# Patient Record
Sex: Female | Born: 1955 | Race: White | Hispanic: No | Marital: Married
Health system: Midwestern US, Community
[De-identification: ages and names within clinical notes are randomized; demographics above are authoritative.]

## PROBLEM LIST (undated history)

## (undated) DIAGNOSIS — M25569 Pain in unspecified knee: Secondary | ICD-10-CM

## (undated) DIAGNOSIS — M5410 Radiculopathy, site unspecified: Secondary | ICD-10-CM

## (undated) DIAGNOSIS — M47812 Spondylosis without myelopathy or radiculopathy, cervical region: Secondary | ICD-10-CM

## (undated) DIAGNOSIS — M545 Low back pain, unspecified: Secondary | ICD-10-CM

## (undated) DIAGNOSIS — K2960 Other gastritis without bleeding: Secondary | ICD-10-CM

## (undated) DIAGNOSIS — M1990 Unspecified osteoarthritis, unspecified site: Secondary | ICD-10-CM

## (undated) DIAGNOSIS — F419 Anxiety disorder, unspecified: Secondary | ICD-10-CM

## (undated) DIAGNOSIS — G2581 Restless legs syndrome: Secondary | ICD-10-CM

## (undated) DIAGNOSIS — R1013 Epigastric pain: Secondary | ICD-10-CM

## (undated) DIAGNOSIS — R519 Headache, unspecified: Secondary | ICD-10-CM

## (undated) DIAGNOSIS — D649 Anemia, unspecified: Secondary | ICD-10-CM

## (undated) DIAGNOSIS — T39395A Adverse effect of other nonsteroidal anti-inflammatory drugs [NSAID], initial encounter: Secondary | ICD-10-CM

## (undated) DIAGNOSIS — IMO0002 Reserved for concepts with insufficient information to code with codable children: Secondary | ICD-10-CM

## (undated) DIAGNOSIS — Z8619 Personal history of other infectious and parasitic diseases: Secondary | ICD-10-CM

## (undated) DIAGNOSIS — M84451A Pathological fracture, right femur, initial encounter for fracture: Secondary | ICD-10-CM

## (undated) DIAGNOSIS — T7840XA Allergy, unspecified, initial encounter: Secondary | ICD-10-CM

## (undated) DIAGNOSIS — R51 Headache: Secondary | ICD-10-CM

## (undated) DIAGNOSIS — K219 Gastro-esophageal reflux disease without esophagitis: Secondary | ICD-10-CM

## (undated) DIAGNOSIS — K5909 Other constipation: Secondary | ICD-10-CM

## (undated) DIAGNOSIS — C801 Malignant (primary) neoplasm, unspecified: Secondary | ICD-10-CM

## (undated) HISTORY — PX: TUBAL LIGATION: SHX77

## (undated) HISTORY — DX: Reserved for concepts with insufficient information to code with codable children: IMO0002

## (undated) HISTORY — DX: Epigastric pain: R10.13

## (undated) HISTORY — DX: Other constipation: K59.09

## (undated) HISTORY — DX: Gastro-esophageal reflux disease without esophagitis: K21.9

## (undated) HISTORY — DX: Restless legs syndrome: G25.81

## (undated) HISTORY — DX: Other gastritis without bleeding: K29.60

## (undated) HISTORY — DX: Personal history of other infectious and parasitic diseases: Z86.19

## (undated) HISTORY — DX: Allergy, unspecified, initial encounter: T78.40XA

## (undated) HISTORY — DX: Adverse effect of other nonsteroidal anti-inflammatory drugs (NSAID), initial encounter: T39.395A

## (undated) HISTORY — DX: Pathological fracture, right femur, initial encounter for fracture: M84.451A

## (undated) HISTORY — DX: Anxiety disorder, unspecified: F41.9

## (undated) HISTORY — DX: Anemia, unspecified: D64.9

## (undated) HISTORY — DX: Unspecified osteoarthritis, unspecified site: M19.90

---

## 1998-02-06 ENCOUNTER — Other Ambulatory Visit: Admission: RE | Admit: 1998-02-06 | Discharge: 1998-02-06 | Payer: Self-pay | Admitting: Gynecology

## 1999-02-05 ENCOUNTER — Ambulatory Visit (HOSPITAL_COMMUNITY): Admission: RE | Admit: 1999-02-05 | Discharge: 1999-02-05 | Payer: Self-pay | Admitting: Internal Medicine

## 1999-02-05 ENCOUNTER — Encounter: Payer: Self-pay | Admitting: Internal Medicine

## 1999-04-09 ENCOUNTER — Other Ambulatory Visit: Admission: RE | Admit: 1999-04-09 | Discharge: 1999-04-09 | Payer: Self-pay | Admitting: Gynecology

## 2000-04-07 ENCOUNTER — Other Ambulatory Visit: Admission: RE | Admit: 2000-04-07 | Discharge: 2000-04-07 | Payer: Self-pay | Admitting: Gynecology

## 2001-02-12 ENCOUNTER — Encounter: Payer: Self-pay | Admitting: Neurosurgery

## 2001-02-12 ENCOUNTER — Ambulatory Visit (HOSPITAL_COMMUNITY): Admission: RE | Admit: 2001-02-12 | Discharge: 2001-02-12 | Payer: Self-pay | Admitting: Neurosurgery

## 2001-02-23 ENCOUNTER — Other Ambulatory Visit: Admission: RE | Admit: 2001-02-23 | Discharge: 2001-02-23 | Payer: Self-pay | Admitting: Internal Medicine

## 2001-04-12 ENCOUNTER — Other Ambulatory Visit: Admission: RE | Admit: 2001-04-12 | Discharge: 2001-04-12 | Payer: Self-pay | Admitting: Gynecology

## 2002-03-22 ENCOUNTER — Other Ambulatory Visit: Admission: RE | Admit: 2002-03-22 | Discharge: 2002-03-22 | Payer: Self-pay | Admitting: Gynecology

## 2003-04-03 ENCOUNTER — Other Ambulatory Visit: Admission: RE | Admit: 2003-04-03 | Discharge: 2003-04-03 | Payer: Self-pay | Admitting: Gynecology

## 2004-04-29 ENCOUNTER — Other Ambulatory Visit: Admission: RE | Admit: 2004-04-29 | Discharge: 2004-04-29 | Payer: Self-pay | Admitting: Gynecology

## 2005-05-15 ENCOUNTER — Ambulatory Visit: Payer: Self-pay | Admitting: Internal Medicine

## 2005-05-19 ENCOUNTER — Other Ambulatory Visit: Admission: RE | Admit: 2005-05-19 | Discharge: 2005-05-19 | Payer: Self-pay | Admitting: Gynecology

## 2006-04-01 ENCOUNTER — Ambulatory Visit (HOSPITAL_COMMUNITY): Admission: RE | Admit: 2006-04-01 | Discharge: 2006-04-01 | Payer: Self-pay | Admitting: Family Medicine

## 2006-12-05 ENCOUNTER — Emergency Department (HOSPITAL_COMMUNITY): Admission: EM | Admit: 2006-12-05 | Discharge: 2006-12-05 | Payer: Self-pay | Admitting: Emergency Medicine

## 2008-05-29 ENCOUNTER — Ambulatory Visit: Payer: Self-pay | Admitting: Internal Medicine

## 2008-06-13 ENCOUNTER — Ambulatory Visit (HOSPITAL_COMMUNITY): Admission: RE | Admit: 2008-06-13 | Discharge: 2008-06-13 | Payer: Self-pay | Admitting: Internal Medicine

## 2008-06-13 ENCOUNTER — Ambulatory Visit: Payer: Self-pay | Admitting: Internal Medicine

## 2008-08-01 ENCOUNTER — Telehealth (INDEPENDENT_AMBULATORY_CARE_PROVIDER_SITE_OTHER): Payer: Self-pay | Admitting: *Deleted

## 2010-10-29 NOTE — Op Note (Signed)
NAMECHANAH, TIDMORE                  ACCOUNT NO.:  [id number]   MEDICAL RECORD NO.:  [id number]          PATIENT TYPE:  AMB   LOCATION:  DAY                           FACILITY:  [facility]   PHYSICIAN:  R. Roetta Sessions, M.D. DATE OF BIRTH:  1956-02-18   DATE OF PROCEDURE:  06/13/2008  DATE OF DISCHARGE:                               OPERATIVE REPORT   INDICATIONS FOR PROCEDURE:  A 55 year old lady with chronic  constipation.  Last colonoscopy was done by Dr. Juanda Chance downs in  [place] 12 years ago.  She does not get results with polyethylene  glycol in the way of MiraLax and GlycoLax.  However, she takes the  agents sporadically and tends to overshoot her endpoint when she resumes  the medication.  Thereafter, no bowel movement in number of days.  She  has not passed any blood per rectum.  There is no family history in the  first-degree relatives with colon cancer.  Colonoscopy is now being done  with standard screening maneuver.  Risks, benefits, and limitations have  been reviewed and questions answered.  She is agreeable.  Please see the  documentation of the medical record.   PROCEDURE NOTE:  O2 saturation, blood pressure, pulse, and respirations  were monitored throughout the entire procedure.   CONSCIOUS SEDATION:  Versed 5 mg IV and Demerol 100 mg IV in divided  doses.   INSTRUMENT:  Pentax video chip system.   FINDINGS:  Digital rectal exam revealed minimal anal canal hemorrhoidal  tags.  Endoscopic findings:  Prep was adequate.  Colon:  The colonic  mucosa was surveyed from the rectosigmoid junction through the left  transverse, right colon, appendiceal orifice, ileocecal valve, and  cecum.  These structures were well seen and photographed for the record.  From this level, scope was slowly withdrawn.  The previously mentioned  mucosal surfaces were again seen.  The colonic mucosa appeared entirely  normal.  Scope was pulled down the rectum.  Thorough examination of the  rectal mucosa including retroflex view of the anal verge demonstrated  only anal canal hemorrhoids.  The patient tolerated the procedure well  and was reactive to Endoscopy.   IMPRESSION:  Anal canal hemorrhoids, otherwise normal rectum and colon.   RECOMMENDATIONS:  1. Repeat screening colonoscopy in 10 years.  2. We would take MiraLax 17 g orally at bedtime and would take it      nightly as needed if no bowel movement on any given day.  She may      take it twice daily in titration, if she has bouts of her      constipation.  If this is not satisfactory in managing bowel      function, she is to call me and let me know.      Jonathon Bellows, M.D.  Electronically Signed     RMR/MEDQ  D:  06/13/2008  T:  06/14/2008  Job:  [id number]   cc:   Tammy R. Collins Scotland, M.D.  Fax: [phone]

## 2010-10-29 NOTE — Telephone Encounter (Signed)
NAMEMarland Matthews  MAJESTI, GAMBRELL                   CHART#:  [id number]   DATE:  05/29/2008                       DOB:  [birth year]   REASON FOR CONSULTATION:  Needs colonoscopy, history of constipation.   PHYSICIAN REQUESTING CONSULTATION:  Tammy R. Collins Scotland, MD   HISTORY OF PRESENT ILLNESS:  The patient is a very pleasant 55 year old  Caucasian female who presents today for further evaluation of chronic  constipation and need for colonoscopy.  She also has chronic GERD.  Her  last colonoscopy was in 1997 by Dr. Lina Sar.  She had hemorrhoids,  but otherwise unremarkable.  She has also had a prior endoscopy by Dr.  Juanda Chance in 2002, for chronic GERD and had a tongue of gastric mucosa at  the distal esophagus, status post biopsy and also had biopsy from the  antrum.  We do not have any other pathology.  The patient states that  she was told she did not have Barrett esophagus.  She has chronic GERD.  She has heartburn daily if she does not take anything.  She generally  takes ranitidine 150 mg b.i.d.  She does this for cost reasons due to  poor healthcare coverage.  She has been on Nexium and Protonix in the  past with pretty good results.  She does take Prilosec OTC on occasions.  She denies any dysphagia or odynophagia.  Generally, her heartburn is  fairly well controlled.  She has breakthrough symptoms with dietary  indiscretions.  She has occasional abdominal bloating, gas and crampy  abdominal pain with certain foods as well.  She believes her lactose  intolerance has started using Lactaid products with some results.  Generally, her bowel movements occur daily if she takes MiraLax 17 g  daily.  She denies any blood in the stool or melena.  She has a aunt who  has colon cancer.  No first degree relatives with colon cancer.   CURRENT MEDICATIONS:  1. MiraLax 17 g daily.  2. Ropinirole 0.5 mg p.r.n., restless leg syndrome.  3. Ranitidine 150 mg b.i.d.  4. Tylenol p.r.n.  5. Butal-APAP p.r.n.   ALLERGIES:  Prednisone.   PAST MEDICAL HISTORY:  Constipation, chronic GERD, and restless leg  syndrome.   PAST SURGICAL HISTORY:  Colonoscopy in 1997 as above, EGD in 2002 as  above, and tubal ligation.   FAMILY HISTORY:  Aunt had colon cancer.  Mother has history of scoliosis  and mitral valve prolapse.  Father deceased in his 17s due to an  accident.   SOCIAL HISTORY:  She is married and has 3 children.  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  No alcohol use.   REVIEW OF SYSTEMS:  See HPI for GI.  Constitutional:  No weight loss.  Cardiopulmonary:  No chest pain, shortness of breath, palpitations, or  cough.  Genitourinary:  No dysuria or hematuria.   PHYSICAL EXAMINATION:  VITAL SIGNS:  Weight 136, height 5 feet 2 inches,  temp 98.3, blood pressure 110/80, and pulse 80.  GENERAL:  A pleasant, well-nourished, well-developed Caucasian female in  no acute distress.  SKIN:  Warm and dry.  No jaundice.  HEENT:  Sclerae nonicteric.  Oropharyngeal mucosa moist and pink.  No  lesions, erythema, or exudate.  No lymphadenopathy or thyromegaly.  CHEST:  Lungs are clear to auscultation.  CARDIAC:  Regular rate and rhythm.  Normal S1 and S2.  No murmurs, rubs,  or gallops.  ABDOMEN:  Positive bowel sounds.  Abdomen is soft and nondistended.  She  has some mild tenderness in the epigastrium to deep palpation.  No  rebound or guarding.  No organomegaly or masses.  No abdominal bruits or  hernias.  LOWER EXTREMITIES:  No edema.   IMPRESSION:  The patient is a 55 year old lady with chronic  gastroesophageal reflux disease and chronic constipation.  It has been  over 12 years since her last colonoscopy.  We would recommend  colonoscopy at this time.  In addition, she has questionable history of  Barrett esophagus, although, according to the patient this was not  confirmed on biopsy.  We would like to see these results.  I had spoken  with her today and if  there is any concern that she may have Barrett  esophagus, would go ahead and pursue EGD for surveillance purposes, but  otherwise would plan on just a colonoscopy.   PLAN:  1. Colonoscopy in near future with Dr. Jena Gauss.  2. Retrieve EGD/biopsy reports from Dr. Regino Schultze office from 2002.  3. Discussed potential for tachyphylaxis, on chronic ranitidine.      Advised that she should take a one-week drug break once a month      and she may consider using Prilosec OTC or Prevacid OTC during      those weeks before resuming ranitidine.  Prilosec OTC, #20 samples      provided.  4. I would like to thank Dr. Collins Scotland for allowing [place] to take part in the      care of this patient.      Tana Coast, P.A.  Electronically Signed     R. Roetta Sessions, M.D.  Electronically Signed   LL/MEDQ  D:  05/29/2008  T:  05/29/2008  Job:  [id number]   cc:   Tammy R. Collins Scotland, M.D.

## 2013-08-02 ENCOUNTER — Other Ambulatory Visit: Payer: Self-pay | Admitting: Obstetrics and Gynecology

## 2013-10-11 LAB — HM COLONOSCOPY: HM COLON: NORMAL

## 2014-03-13 LAB — LIPID PANEL
CHOLESTEROL: 233 mg/dL — AB (ref 0–200)
HDL: 86 mg/dL — AB (ref 35–70)
LDL Cholesterol: 130 mg/dL
TRIGLYCERIDES: 84 mg/dL (ref 40–160)

## 2014-03-13 LAB — TSH: TSH: 1.82 u[IU]/mL (ref <=5.90)

## 2014-03-13 LAB — BASIC METABOLIC PANEL
BUN: 13 mg/dL (ref 4–21)
CREATININE: 0.7 mg/dL (ref <=1.1)
Glucose: 90 mg/dL
POTASSIUM: 5 mmol/L (ref 3.4–5.3)
SODIUM: 140 mmol/L (ref 137–147)

## 2014-03-13 LAB — HEPATIC FUNCTION PANEL
ALK PHOS: 69 U/L (ref 25–125)
ALT: 15 U/L (ref 7–35)
AST: 22 U/L (ref 13–35)
BILIRUBIN, TOTAL: 1.6 mg/dL

## 2014-03-13 LAB — HM PAP SMEAR

## 2014-04-27 ENCOUNTER — Other Ambulatory Visit: Payer: Self-pay | Admitting: Dermatology

## 2014-09-25 ENCOUNTER — Other Ambulatory Visit: Payer: Self-pay | Admitting: Obstetrics and Gynecology

## 2014-09-26 LAB — CYTOLOGY - PAP

## 2014-11-16 LAB — CBC AND DIFFERENTIAL
HCT: 35 % — AB (ref 36–46)
HEMOGLOBIN: 11.8 g/dL — AB (ref 12.0–16.0)
Neutrophils Absolute: 5 /uL
Platelets: 227 10*3/uL (ref 150–399)
WBC: 7.7 10^3/mL

## 2014-12-12 ENCOUNTER — Telehealth: Payer: Self-pay | Admitting: Internal Medicine

## 2014-12-12 NOTE — Telephone Encounter (Signed)
Pt is requesting to switch from Dr. Gala Romney back to Dr. Olevia Perches. Records placed on Dr. Nichola Sizer desk for review

## 2014-12-13 ENCOUNTER — Telehealth: Payer: Self-pay | Admitting: Internal Medicine

## 2014-12-13 NOTE — Telephone Encounter (Signed)
Rec'd from Dr. Audree Camel forward 6 pages to Dr. Olevia Perches

## 2014-12-20 ENCOUNTER — Encounter: Payer: Self-pay | Admitting: Internal Medicine

## 2014-12-20 NOTE — Telephone Encounter (Signed)
Records reviewed and sch'd Pt for 03-01-15 @ 2:45 w/Dr. Olevia Perches. Paperwork mailed to Pt

## 2015-01-19 ENCOUNTER — Ambulatory Visit (INDEPENDENT_AMBULATORY_CARE_PROVIDER_SITE_OTHER): Payer: 59 | Admitting: Internal Medicine

## 2015-01-19 ENCOUNTER — Encounter: Payer: Self-pay | Admitting: Internal Medicine

## 2015-01-19 ENCOUNTER — Other Ambulatory Visit (INDEPENDENT_AMBULATORY_CARE_PROVIDER_SITE_OTHER): Payer: 59

## 2015-01-19 VITALS — BP 124/76 | HR 72 | Ht 62.0 in | Wt 132.2 lb

## 2015-01-19 DIAGNOSIS — D509 Iron deficiency anemia, unspecified: Secondary | ICD-10-CM | POA: Diagnosis not present

## 2015-01-19 DIAGNOSIS — R1013 Epigastric pain: Secondary | ICD-10-CM | POA: Diagnosis not present

## 2015-01-19 LAB — CBC WITH DIFFERENTIAL/PLATELET
BASOS PCT: 0.8 % (ref 0.0–3.0)
Basophils Absolute: 0 10*3/uL (ref 0.0–0.1)
EOS ABS: 0.1 10*3/uL (ref 0.0–0.7)
Eosinophils Relative: 1.4 % (ref 0.0–5.0)
HCT: 40.6 % (ref 36.0–46.0)
Hemoglobin: 13.7 g/dL (ref 12.0–15.0)
LYMPHS ABS: 1.3 10*3/uL (ref 0.7–4.0)
LYMPHS PCT: 28.4 % (ref 12.0–46.0)
MCHC: 33.8 g/dL (ref 30.0–36.0)
MCV: 88.1 fl (ref 78.0–100.0)
MONOS PCT: 6.2 % (ref 3.0–12.0)
Monocytes Absolute: 0.3 10*3/uL (ref 0.1–1.0)
Neutro Abs: 2.9 10*3/uL (ref 1.4–7.7)
Neutrophils Relative %: 63.2 % (ref 43.0–77.0)
Platelets: 162 10*3/uL (ref 150.0–400.0)
RBC: 4.6 Mil/uL (ref 3.87–5.11)
RDW: 21.1 % — AB (ref 11.5–15.5)
WBC: 4.6 10*3/uL (ref 4.0–10.5)

## 2015-01-19 NOTE — Progress Notes (Signed)
ROSALYND MCWRIGHT Oct 26, 1955 [id number]  Note: This dictation was prepared with Dragon digital system. Any transcriptional errors that result from this procedure are unintentional.   History of Present Illness: This is a 59 year old white female who is here for evaluation of iron deficiency anemia. We saw her  in 1999 for screening colonoscopy which was normal. She had a subsequent colonoscopy in December 2009 by Dr.Rourk and again this was a normal exam except for anal canal hemorrhoids. She has been having intermittent heartburn and regurgitation of the food for which she takes ranitidine 150 mg as needed and most recently on every day basis. She had a severe attack of epigastric pain lasting several hours about a week ago. There is a positive family history of gallbladder disease in her mother. She has lost 3-4 pounds in the last few months. She has normal bowel habits. She denies having wheat allergy. In June 2016 serum iron 7. Iron saturation 10%. Ferritin 8.0 hemoglobin 11.8 with MCV of 78. She has been postmenopausal for over 10 years. She received  2 iron infusions by Dr Jacquiline Doe.Marland KitchensSe no longer chews on ice. There is no family history of inflammatory bowel disease or colon cancer     Past Medical History  Diagnosis Date  . Chronic constipation   . GERD (gastroesophageal reflux disease)   . RLS (restless legs syndrome)   . Anemia     Past Surgical History  Procedure Laterality Date  . Tubal ligation      Allergies  Allergen Reactions  . Prednisone     Family history and social history have been reviewed.  Review of Systems:   The remainder of the 10 point ROS is negative except as outlined in the H&P  Physical Exam: General Appearance Well developed, in no distress Eyes  Non icteric  HEENT  Non traumatic, normocephalic  Mouth No lesion, tongue papillated, no cheilosis Neck Supple without adenopathy, thyroid not enlarged, no carotid bruits, no JVD Lungs Clear to auscultation  bilaterally COR Normal S1, normal S2, regular rhythm, no murmur, quiet precordium Abdomen Soft with good muscular support. Normoactive bowel sounds. No tenderness. Liver edge at costal margin  Rectal Soft Hemoccult-negative stool  Extremities  No pedal edema Skin No lesions Neurological Alert and oriented x 3 Psychological Normal mood and affect  Assessment and Plan:   59 year old white female with asymptomatic iron deficiency anemia and history of gastroesophageal reflux only partially controlled on ranitidine. She has had minimal weight loss . She has taken 2-4 ibuprofen 3-4 times a week whichmay be causing gastropathy possibly, an ulcer. She is heme occult negative on my exam today. Last colonoscopy in 2009. We will proceed with upper endoscopy and colonoscopy. We will also check sprue profile and repeat  CBC.  Episodic epigastric pain. In the setting of positive family history of gallbladder disease. We will proceed with upper abdominal ultrasound to visualize gallbladder as well as her pancreas    Delfin Edis 01/19/2015

## 2015-01-19 NOTE — Patient Instructions (Addendum)
Dr Paul Dykes, [facility]  Your physician has requested that you go to the basement for lab work before leaving today.  You have been scheduled for an abdominal ultrasound at [facility] (1st floor of hospital) on 01/24/2015 at 8:00am. Please arrive 15 minutes prior to your appointment for registration. Make certain not to have anything to eat or drink 6 hours prior to your appointment. Should you need to reschedule your appointment, please contact radiology at [phone]. This test typically takes about 30 minutes to perform.

## 2015-01-22 LAB — GLIA (IGA/G) + TTG IGA
Gliadin IgA: 5 Units (ref <20)
Gliadin IgG: 2 Units (ref <20)
Tissue Transglutaminase Ab, IgA: 1 U/mL (ref <4)

## 2015-01-24 ENCOUNTER — Ambulatory Visit (HOSPITAL_COMMUNITY)
Admission: RE | Admit: 2015-01-24 | Discharge: 2015-01-24 | Disposition: A | Discharge status: Home / Self Care | Payer: 59 | Source: Ambulatory Visit | Attending: Internal Medicine | Admitting: Internal Medicine

## 2015-01-24 DIAGNOSIS — R1013 Epigastric pain: Secondary | ICD-10-CM | POA: Insufficient documentation

## 2015-01-25 ENCOUNTER — Telehealth: Payer: Self-pay | Admitting: Internal Medicine

## 2015-01-25 DIAGNOSIS — R932 Abnormal findings on diagnostic imaging of liver and biliary tract: Secondary | ICD-10-CM

## 2015-01-25 NOTE — Telephone Encounter (Signed)
Scheduled HIDA with [facility]) at [facility] radiology on 02/13/15 at 9:30 AM. Patient notified of appointment date and time.

## 2015-01-25 NOTE — Telephone Encounter (Signed)
I did not realize she was having so many symptoms for so long. In that case  ,please ,schedule HIDA scan with CCK to see if gall bladder functions. It will help [place] to make a decision about lap chole.

## 2015-01-25 NOTE — Telephone Encounter (Signed)
Patient left a message that she wants to know what to expect with the [place] result of sludge in gall bladder. States she has had 2 years of a heavy feeling, nausea and discomfort after eating a heavy meal. Also reports she has had several "attacks of pain that rates a 10." Please, advise.

## 2015-01-25 NOTE — Telephone Encounter (Signed)
Left a message for patient to call back. 

## 2015-01-30 ENCOUNTER — Telehealth: Payer: Self-pay | Admitting: Internal Medicine

## 2015-01-30 ENCOUNTER — Encounter: Payer: Self-pay | Admitting: *Deleted

## 2015-01-30 NOTE — Telephone Encounter (Signed)
Left a message for patient to call back. 

## 2015-01-30 NOTE — Telephone Encounter (Signed)
Spoke with patient and gave her information re: HIDA scan.

## 2015-02-13 ENCOUNTER — Encounter (HOSPITAL_COMMUNITY)
Admission: RE | Admit: 2015-02-13 | Discharge: 2015-02-13 | Disposition: A | Discharge status: Home / Self Care | Payer: 59 | Source: Ambulatory Visit | Attending: Internal Medicine | Admitting: Internal Medicine

## 2015-02-13 DIAGNOSIS — R932 Abnormal findings on diagnostic imaging of liver and biliary tract: Secondary | ICD-10-CM | POA: Diagnosis not present

## 2015-02-13 MED ORDER — TECHNETIUM TC 99M MEBROFENIN IV KIT
5.2000 | PACK | Freq: Once | INTRAVENOUS | Status: DC | PRN
  Administered 2015-02-13: 5.2 via INTRAVENOUS
  Filled 2015-02-13: qty 6

## 2015-02-13 NOTE — Progress Notes (Signed)
I briefly reviewed her chart. Normal HIDA argues against gallbladder etiology. I believe she is scheduled for EGD and colonoscopy for her anemia, is this scheduled? I would recommend EGD as the next step to evaluate her symptoms and await results of this and colonoscopy regarding her anemia. Thanks

## 2015-02-14 ENCOUNTER — Other Ambulatory Visit: Payer: Self-pay | Admitting: *Deleted

## 2015-02-14 ENCOUNTER — Telehealth: Payer: Self-pay | Admitting: Gastroenterology

## 2015-02-14 MED ORDER — OMEPRAZOLE 40 MG PO CPDR: 40.0000 mg | DELAYED_RELEASE_CAPSULE | Freq: Every day | ORAL | Status: DC

## 2015-02-14 NOTE — Telephone Encounter (Signed)
Debra Payor, RN spoke with the patient and all of her questions answered

## 2015-02-16 ENCOUNTER — Ambulatory Visit: Payer: 59 | Admitting: Gastroenterology

## 2015-03-01 ENCOUNTER — Ambulatory Visit: Payer: Self-pay | Admitting: Internal Medicine

## 2015-03-13 ENCOUNTER — Ambulatory Visit (AMBULATORY_SURGERY_CENTER): Payer: Self-pay

## 2015-03-13 VITALS — Ht 62.0 in | Wt 133.0 lb

## 2015-03-13 DIAGNOSIS — D509 Iron deficiency anemia, unspecified: Secondary | ICD-10-CM

## 2015-03-13 DIAGNOSIS — K219 Gastro-esophageal reflux disease without esophagitis: Secondary | ICD-10-CM

## 2015-03-13 MED ORDER — NA SULFATE-K SULFATE-MG SULF 17.5-3.13-1.6 GM/177ML PO SOLN: ORAL | Status: DC

## 2015-03-13 NOTE — Progress Notes (Signed)
Per pt, no allergies to soy or egg products.Pt not taking any weight loss meds or using  O2 at home. 

## 2015-03-21 ENCOUNTER — Ambulatory Visit (AMBULATORY_SURGERY_CENTER): Payer: 59 | Admitting: Gastroenterology

## 2015-03-21 ENCOUNTER — Encounter: Payer: Self-pay | Admitting: Gastroenterology

## 2015-03-21 VITALS — BP 131/68 | HR 63 | Temp 97.8°F | Resp 19 | Ht 62.0 in | Wt 133.0 lb

## 2015-03-21 DIAGNOSIS — K2970 Gastritis, unspecified, without bleeding: Secondary | ICD-10-CM

## 2015-03-21 DIAGNOSIS — K2990 Gastroduodenitis, unspecified, without bleeding: Secondary | ICD-10-CM | POA: Diagnosis not present

## 2015-03-21 DIAGNOSIS — D509 Iron deficiency anemia, unspecified: Secondary | ICD-10-CM

## 2015-03-21 DIAGNOSIS — R197 Diarrhea, unspecified: Secondary | ICD-10-CM | POA: Diagnosis not present

## 2015-03-21 DIAGNOSIS — K219 Gastro-esophageal reflux disease without esophagitis: Secondary | ICD-10-CM

## 2015-03-21 MED ORDER — SODIUM CHLORIDE 0.9 % IV SOLN: 500.0000 mL | INTRAVENOUS | Status: DC

## 2015-03-21 NOTE — Progress Notes (Signed)
Report to PACU, RN, vss, BBS= Clear.  

## 2015-03-21 NOTE — Progress Notes (Signed)
Called to room to assist during endoscopic procedure.  Patient ID and intended procedure confirmed with present staff. Received instructions for my participation in the procedure from the performing physician.  

## 2015-03-21 NOTE — Patient Instructions (Addendum)
Biopsies taken today.  NO NSAIDS! May take Tylenol as needed for pain.  Continue twice daily Omeprazole.  Resume current diet.  YOU HAD AN ENDOSCOPIC PROCEDURE TODAY AT [facility]:   Refer to the procedure report that was given to you for any specific questions about what was found during the examination.  If the procedure report does not answer your questions, please call your gastroenterologist to clarify.  If you requested that your care partner not be given the details of your procedure findings, then the procedure report has been included in a sealed envelope for you to review at your convenience later.  YOU SHOULD EXPECT: Some feelings of bloating in the abdomen. Passage of more gas than usual.  Walking can help get rid of the air that was put into your GI tract during the procedure and reduce the bloating. If you had a lower endoscopy (such as a colonoscopy or flexible sigmoidoscopy) you may notice spotting of blood in your stool or on the toilet paper. If you underwent a bowel prep for your procedure, you may not have a normal bowel movement for a few days.  Please Note:  You might notice some irritation and congestion in your nose or some drainage.  This is from the oxygen used during your procedure.  There is no need for concern and it should clear up in a day or so.  SYMPTOMS TO REPORT IMMEDIATELY:   Following lower endoscopy (colonoscopy or flexible sigmoidoscopy):  Excessive amounts of blood in the stool  Significant tenderness or worsening of abdominal pains  Swelling of the abdomen that is new, acute  Fever of 100F or higher   Following upper endoscopy (EGD)  Vomiting of blood or coffee ground material  New chest pain or pain under the shoulder blades  Painful or persistently difficult swallowing  New shortness of breath  Fever of 100F or higher  Black, tarry-looking stools  For urgent or emergent issues, a gastroenterologist can be reached at any hour  by calling [phone].   DIET: Your first meal following the procedure should be a small meal and then it is ok to progress to your normal diet. Heavy or fried foods are harder to digest and may make you feel nauseous or bloated.  Likewise, meals heavy in dairy and vegetables can increase bloating.  Drink plenty of fluids but you should avoid alcoholic beverages for 24 hours.  ACTIVITY:  You should plan to take it easy for the rest of today and you should NOT DRIVE or use heavy machinery until tomorrow (because of the sedation medicines used during the test).    FOLLOW UP: Our staff will call the number listed on your records the next business day following your procedure to check on you and address any questions or concerns that you may have regarding the information given to you following your procedure. If we do not reach you, we will leave a message.  However, if you are feeling well and you are not experiencing any problems, there is no need to return our call.  We will assume that you have returned to your regular daily activities without incident.  If any biopsies were taken you will be contacted by phone or by letter within the next 1-3 weeks.  Please call us at [phone] if you have not heard about the biopsies in 3 weeks.    SIGNATURES/CONFIDENTIALITY: You and/or your care partner have signed paperwork which will be entered into your  electronic medical record.  These signatures attest to the fact that that the information above on your After Visit Summary has been reviewed and is understood.  Full responsibility of the confidentiality of this discharge information lies with you and/or your care-partner.

## 2015-03-21 NOTE — Op Note (Signed)
[place]  [address]. [place], [id number]   COLONOSCOPY PROCEDURE REPORT  PATIENT: Debra Matthews, Debra Matthews  MR#: [id number] BIRTHDATE: [birth year] , 59  yrs. old GENDER: female ENDOSCOPIST: Yetta Flock, MD REFERRED BY: PROCEDURE DATE:  03/21/2015 PROCEDURE:   Colonoscopy, diagnostic and Colonoscopy with biopsy First Screening Colonoscopy - Avg.  risk and is 50 yrs.  old or older - No.  Prior Negative Screening - Now for repeat screening. 10 or more years since last screening  History of Adenoma - Now for follow-up colonoscopy & has been > or = to 3 yrs.  N/A  Polyps removed today? No Recommend repeat exam, <10 yrs? Yes Recommend repeat exam, <10 yrs? No ASA CLASS:   Class II INDICATIONS:Unexplained iron deficiency anemia and Colorectal Neoplasm Risk Assessment for this procedure is average risk. MEDICATIONS: Propofol 200 mg IV  DESCRIPTION OF PROCEDURE:   After the risks benefits and alternatives of the procedure were thoroughly explained, informed consent was obtained.  The digital rectal exam revealed no abnormalities of the rectum.   The LB [id number]  endoscope was introduced through the anus and advanced to the ileum. No adverse events experienced.   The quality of the prep was adequate The instrument was then slowly withdrawn as the colon was fully examined. Estimated blood loss is zero unless otherwise noted in this procedure report.  COLON FINDINGS: The examined colon was normal without polyps or mass lesions.  No inflammatory changes were noted.  Biopsies were taken for microscopic colitis.  The terminal ileum was intubated and appeared normal.  Overall no pathology was appreciated to account for the patient's anemia.  Retroflexed views revealed internal hemorrhoids. The time to cecum = 5.5 Withdrawal time = 17.5   The scope was withdrawn and the procedure completed. COMPLICATIONS: There were no immediate complications.  ENDOSCOPIC  IMPRESSION: Normal examined colon without polyps or inflammatory changes - biopsies taken to rule out microscopic colitis Normal ileum Overall, no pathology noted on colonoscopy to account for anemia, which I suspect may be due to gastritis noted on EGD  RECOMMENDATIONS: Resume diet Resume medications No NSAIDs Await pathology results  eSigned:  Yetta Flock, MD 03/21/2015 4:18 PM   cc: the patient

## 2015-03-21 NOTE — Op Note (Signed)
[place]  [address]. [place], [address]   ENDOSCOPY PROCEDURE REPORT  PATIENT: Debra, Matthews  MR#: [id number] BIRTHDATE: Dec 07, 1955 , 59  yrs. old GENDER: female ENDOSCOPIST: Yetta Flock, MD REFERRED BY: PROCEDURE DATE:  03/21/2015 PROCEDURE:  EGD w/ biopsy ASA CLASS:     Class II INDICATIONS:  iron deficiency anemia. Patient with ongoing reflux symptoms and history of NSAID use MEDICATIONS: Propofol 200 mg IV TOPICAL ANESTHETIC:  DESCRIPTION OF PROCEDURE: After the risks benefits and alternatives of the procedure were thoroughly explained, informed consent was obtained.  The LB [id number] endoscope was introduced through the mouth and advanced to the second portion of the duodenum , Without limitations.  The instrument was slowly withdrawn as the mucosa was fully examined.  FINDINGS:The esophagus appeared normal.  DH, GEJ, and SCJ were located 38cm from the incisors.  The stomach was remarkable for erosive gastritis, most prominent in the antrum, but also mild in the gastric body.  No focal ulcers were noted however multiple small erosions appreciated.  The remainder of the examined stomach was normal.  Biopsies were taken from the antrum and body to rule out H pylori.  The duodenal bulb and 2nd portion of the duodenum were normal.  Retroflexed views revealed no abnormalities.     The scope was then withdrawn from the patient and the procedure completed.  COMPLICATIONS: There were no immediate complications.  ENDOSCOPIC IMPRESSION: Normal esophagus Erosive erythematous gastropathy without focal ulceration - biopsies obtained. I suspect this could be related to the patient's anemia. Unclear if this is due to H pylori or NSAID use Normal duodenum    RECOMMENDATIONS: Resume medications Continue twice daily omeprazole Avoid all NSAIDs, use tylenol for pain Resume diet Await, pathology results with further recommendations  pending results    eSigned:  Yetta Flock, MD 03/21/2015 4:26 PM    CC: the patient  PATIENT NAME:  Debra, Matthews MR#: [id number]

## 2015-03-22 ENCOUNTER — Telehealth: Payer: Self-pay | Admitting: *Deleted

## 2015-03-22 NOTE — Telephone Encounter (Signed)
  Follow up Call-  Call back number 03/21/2015  Post procedure Call Back phone  # [phone]  Permission to leave phone message Yes     Patient questions:  Do you have a fever, pain , or abdominal swelling? No. Pain Score  0 *  Have you tolerated food without any problems? Yes.    Have you been able to return to your normal activities? Yes.    Do you have any questions about your discharge instructions: Diet   No. Medications  No. Follow up visit  No.  Do you have questions or concerns about your Care? No.  Actions: * If pain score is 4 or above: No action needed, pain <4.

## 2015-03-27 ENCOUNTER — Telehealth: Payer: Self-pay | Admitting: *Deleted

## 2015-03-27 ENCOUNTER — Other Ambulatory Visit: Payer: Self-pay | Admitting: *Deleted

## 2015-03-27 DIAGNOSIS — D649 Anemia, unspecified: Secondary | ICD-10-CM

## 2015-03-27 NOTE — Telephone Encounter (Signed)
Patient wanted to be sure Dr. Havery Moros knows she is still having food come back up into throat when she is eating. Does not happen every meal but does happen about 50% of the time.

## 2015-03-27 NOTE — Telephone Encounter (Signed)
Her pathology results just returned and I forwarded a message to you to relay results to the patient. Her esophagus was normal on EGD. She is having some intermittent regurgitation. She should continue omeprazole twice daily as recommended, and can follow up with me for reassessment in a few months. Thanks

## 2015-03-28 NOTE — Telephone Encounter (Signed)
Patient given recommendations. 

## 2015-04-09 ENCOUNTER — Encounter: Payer: Self-pay | Admitting: Gastroenterology

## 2015-04-09 NOTE — Telephone Encounter (Signed)
A user error has taken place.

## 2015-05-24 ENCOUNTER — Telehealth: Payer: Self-pay | Admitting: *Deleted

## 2015-05-24 NOTE — Telephone Encounter (Signed)
Patient's husband will let patient know she is due for labs next week.

## 2015-05-24 NOTE — Telephone Encounter (Signed)
-----   Message from Hulan Saas, RN sent at 03/27/2015  2:34 PM EDT ----- Call and remind due for CBC for SA. Lab in.

## 2015-06-08 ENCOUNTER — Ambulatory Visit (INDEPENDENT_AMBULATORY_CARE_PROVIDER_SITE_OTHER): Payer: 59 | Admitting: Gastroenterology

## 2015-06-08 ENCOUNTER — Encounter: Payer: Self-pay | Admitting: Gastroenterology

## 2015-06-08 VITALS — BP 120/70 | HR 64 | Ht 62.0 in | Wt 132.2 lb

## 2015-06-08 DIAGNOSIS — K219 Gastro-esophageal reflux disease without esophagitis: Secondary | ICD-10-CM | POA: Diagnosis not present

## 2015-06-08 DIAGNOSIS — D509 Iron deficiency anemia, unspecified: Secondary | ICD-10-CM

## 2015-06-08 DIAGNOSIS — K2960 Other gastritis without bleeding: Secondary | ICD-10-CM | POA: Diagnosis not present

## 2015-06-08 DIAGNOSIS — T3991XA Poisoning by unspecified nonopioid analgesic, antipyretic and antirheumatic, accidental (unintentional), initial encounter: Secondary | ICD-10-CM

## 2015-06-08 DIAGNOSIS — T39395A Adverse effect of other nonsteroidal anti-inflammatory drugs [NSAID], initial encounter: Secondary | ICD-10-CM

## 2015-06-08 MED ORDER — OMEPRAZOLE 40 MG PO CPDR: 40.0000 mg | DELAYED_RELEASE_CAPSULE | Freq: Two times a day (BID) | ORAL | Status: DC

## 2015-06-08 NOTE — Progress Notes (Signed)
HPI :  59 y/o female here for follow up after having EGD and colonoscopy for iron deficiency anemia, done a few months ago. EGD showed erosive gastritis, H pylori negative, thought to be related to NSAIDs. Her colonoscopy showed a normal colon and ileum, and no microscopic colitis noted on path (patient had complained of loose stools at the time). Leading up to the procedure she took NSAIDs a few days per week (excedrin and ibuprofen a few times per week). She has otherwise omeprazole twice daily. She reports it has helped for heartburn, as well as avoiding spicey foods which can trigger symptoms. She has increased the dose to twice daily after the endoscopy. She is not sure if she noticed any difference between once and twice per day in regard to her symptoms, and generally she has been quite happy with how she has felt on this in regards to her reflux symptoms, until recently. Otherwiseshe had been doing well in general with the exception for the past week, her reflux sypmtoms have recurred somewhat. She is not sure if stress is contributing to her symptoms .   She reports some constipation since I have seen her, taking fiber supplement which has helped. She previously had some loose stools at the time of the procedure which has since resolved. Biopsies of the colon were normal.   She had previously been on some oral iron for a short period of time and IV iron infusions this past summer. She has not been on any oral iron for a while now. She had repeat blood work done this past Tuesday which showed considerable improvement. Results in lab section below.      Past Medical History  Diagnosis Date  . Chronic constipation     in past  . GERD (gastroesophageal reflux disease)   . RLS (restless legs syndrome)   . Anemia   . Abdominal pain, epigastric   . Gastritis due to nonsteroidal anti-inflammatory drug (NSAID)      Past Surgical History  Procedure Laterality Date  . Tubal ligation      Family History  Problem Relation Age of Onset  . Colon cancer Maternal Aunt   . Scoliosis Mother   . Mitral valve prolapse Mother    Social History  Substance Use Topics  . Smoking status: Never Smoker   . Smokeless tobacco: Never Used  . Alcohol Use: No   Current Outpatient Prescriptions  Medication Sig Dispense Refill  . acetaminophen (TYLENOL) 325 MG tablet Take 650 mg by mouth as needed.    Marland Kitchen escitalopram (LEXAPRO) 10 MG tablet Take 1 tablet by mouth daily.    . Estradiol (VAGIFEM) 10 MCG TABS vaginal tablet Place vaginally. Use 2 times per month    . omeprazole (PRILOSEC) 40 MG capsule Take 1 capsule (40 mg total) by mouth 2 (two) times daily. 60 capsule 3  . pramipexole (MIRAPEX) 1 MG tablet Take 1.5 tablets by mouth daily.    . ranitidine (ZANTAC) 150 MG tablet Take 150 mg by mouth as needed for heartburn. Alternating with Pepcid    . traMADol (ULTRAM) 50 MG tablet Take 50 mg by mouth. Take one 3 to 4 times a month usually for pain    . traZODone (DESYREL) 100 MG tablet Take 1 tablet by every 3-4 times per week    . Wheat Dextrin (BENEFIBER PO) Take by mouth. Take 1 tablespoon 2 to 3 times a  week     No current facility-administered medications for this  visit.   Allergies  Allergen Reactions  . Prednisone     Pt feels bad, headache, no appetite.     Review of Systems: All systems reviewed and negative except where noted in HPI.   Labs - in Care Everywhere:  June 2016 - Hgb 11.8, ferritin 8.0, iron level of 7  Dec 22 - Hgb 13.8, MVC 90s, ferritin 272, iron 129  Physical Exam: BP 120/70 mmHg  Pulse 64  Ht 5\' 2"  (1.575 m)  Wt 132 lb 3.2 oz (59.966 kg)  BMI 24.17 kg/m2 Constitutional: Pleasant,well-developed, female in no acute distress. HEENT: Normocephalic and atraumatic. Conjunctivae are normal. No scleral icterus. Neck supple.  Cardiovascular: Normal rate, regular rhythm.  Pulmonary/chest: Effort normal and breath sounds normal. No wheezing, rales or  rhonchi. Abdominal: Soft, nondistended, nontender. Bowel sounds active throughout. There are no masses palpable. No hepatomegaly. Extremities: no edema Lymphadenopathy: No cervical adenopathy noted. Neurological: Alert and oriented to person place and time. Skin: Skin is warm and dry. No rashes noted. Psychiatric: Normal mood and affect. Behavior is normal.     ASSESSMENT AND PLAN: 59 y/o female who presented this past summer with an iron deficiency anemia as above. EGD showed erosive gastritis in the setting of NSAID use. Biopsies negative for H pylori. Colonoscopy was unremarkable. We increased her PPI to twice daily and had her hold all NSAIDs. Hgb has intervally risen from 11.8 to 13.8 and ferritin level went from 8 to 272. At this time I don't think any further workup for iron deficiency is warranted, as I suspect it was likely related to NSAID gastritis noted on EGD.   Moving forward we discussed her PPI dose. Her GERD had been better controlled on twice daily dosing, and she thinks it typically worsens around the holidays when she has more stress. Recommend long term using the lowest daily dose of PPI needed to control her symptoms, although will continue twice daily dosing until the holidays are over. We otherwise discussed the long term risks of PPI use. The risks of long term PPIs with current data include increased risk for chronic kidney disease, increased risk of fracture, increased risk of C diff, increased risk of pneumonia (short term usage), potentially increased risk of dementia, potentially increased risk of B12 / calcium deficiency, and rare risk of hypomagnesemia. Renal function is currently normal and would recommend checking this yearly while on PPI therapy.   She can follow up as needed for GERD. Otherwise recommend she minimize her NSAID use moving forward.   Ward Cellar, MD [facility] Pager [phone]  CC: Caren Macadam, MD

## 2015-06-08 NOTE — Patient Instructions (Signed)
We have sent the following medications to your pharmacy for you to pick up at your convenience: Omeprazole 40mg.   

## 2015-10-15 LAB — HM MAMMOGRAPHY: HM MAMMO: NORMAL (ref 0–4)

## 2015-10-15 LAB — HM PAP SMEAR

## 2015-11-02 ENCOUNTER — Encounter

## 2015-11-06 ENCOUNTER — Inpatient Hospital Stay: Admit: 2015-11-06 | Payer: BLUE CROSS/BLUE SHIELD | Attending: Pain Medicine | Primary: Internal Medicine

## 2015-11-06 ENCOUNTER — Ambulatory Visit

## 2015-11-06 DIAGNOSIS — M4712 Other spondylosis with myelopathy, cervical region: Secondary | ICD-10-CM

## 2015-11-08 ENCOUNTER — Other Ambulatory Visit: Payer: Self-pay | Admitting: Obstetrics and Gynecology

## 2015-11-13 LAB — CYTOLOGY - PAP

## 2015-11-28 ENCOUNTER — Encounter: Payer: Self-pay | Admitting: General Practice

## 2015-11-29 ENCOUNTER — Encounter

## 2015-11-29 ENCOUNTER — Inpatient Hospital Stay: Admit: 2015-11-29 | Payer: BLUE CROSS/BLUE SHIELD | Attending: Neurological Surgery | Primary: Internal Medicine

## 2015-11-29 DIAGNOSIS — M47812 Spondylosis without myelopathy or radiculopathy, cervical region: Secondary | ICD-10-CM

## 2016-01-24 ENCOUNTER — Encounter

## 2016-01-24 ENCOUNTER — Inpatient Hospital Stay: Admit: 2016-01-24 | Payer: BLUE CROSS/BLUE SHIELD | Primary: Internal Medicine

## 2016-01-24 DIAGNOSIS — M1711 Unilateral primary osteoarthritis, right knee: Secondary | ICD-10-CM

## 2016-02-06 ENCOUNTER — Encounter: Payer: Self-pay | Admitting: Emergency Medicine

## 2016-02-08 ENCOUNTER — Telehealth: Payer: Self-pay | Admitting: Emergency Medicine

## 2016-02-08 ENCOUNTER — Encounter

## 2016-02-08 NOTE — Telephone Encounter (Signed)
Called pt to complete pre-visit phone call. LMOVM to return call. Patient advised on message that if unable to return call to please arrive 10-15 minutes early for appointment to update all insurance and paperwork with front desk.

## 2016-02-11 ENCOUNTER — Encounter: Payer: Self-pay | Admitting: Family Medicine

## 2016-02-11 ENCOUNTER — Ambulatory Visit (INDEPENDENT_AMBULATORY_CARE_PROVIDER_SITE_OTHER): Payer: 59 | Admitting: Family Medicine

## 2016-02-11 VITALS — BP 112/76 | HR 82 | Temp 98.1°F | Resp 16 | Ht 62.0 in | Wt 136.2 lb

## 2016-02-11 DIAGNOSIS — K219 Gastro-esophageal reflux disease without esophagitis: Secondary | ICD-10-CM | POA: Insufficient documentation

## 2016-02-11 DIAGNOSIS — G2581 Restless legs syndrome: Secondary | ICD-10-CM | POA: Diagnosis not present

## 2016-02-11 DIAGNOSIS — D509 Iron deficiency anemia, unspecified: Secondary | ICD-10-CM | POA: Diagnosis not present

## 2016-02-11 DIAGNOSIS — Z79890 Hormone replacement therapy (postmenopausal): Secondary | ICD-10-CM | POA: Insufficient documentation

## 2016-02-11 DIAGNOSIS — G43909 Migraine, unspecified, not intractable, without status migrainosus: Secondary | ICD-10-CM | POA: Insufficient documentation

## 2016-02-11 DIAGNOSIS — G43009 Migraine without aura, not intractable, without status migrainosus: Secondary | ICD-10-CM

## 2016-02-11 DIAGNOSIS — Z23 Encounter for immunization: Secondary | ICD-10-CM | POA: Diagnosis not present

## 2016-02-11 DIAGNOSIS — R5383 Other fatigue: Secondary | ICD-10-CM

## 2016-02-11 LAB — BASIC METABOLIC PANEL
BUN: 15 mg/dL (ref 6–23)
CHLORIDE: 105 meq/L (ref 96–112)
CO2: 27 mEq/L (ref 19–32)
Calcium: 9.2 mg/dL (ref 8.4–10.5)
Creatinine, Ser: 0.66 mg/dL (ref 0.40–1.20)
GFR: 96.66 mL/min (ref >60.00)
Glucose, Bld: 86 mg/dL (ref 70–99)
POTASSIUM: 4.4 meq/L (ref 3.5–5.1)
SODIUM: 141 meq/L (ref 135–145)

## 2016-02-11 LAB — CBC WITH DIFFERENTIAL/PLATELET
BASOS PCT: 0.4 % (ref 0.0–3.0)
Basophils Absolute: 0 10*3/uL (ref 0.0–0.1)
EOS PCT: 1.1 % (ref 0.0–5.0)
Eosinophils Absolute: 0 10*3/uL (ref 0.0–0.7)
HEMATOCRIT: 41.3 % (ref 36.0–46.0)
HEMOGLOBIN: 13.9 g/dL (ref 12.0–15.0)
LYMPHS PCT: 30.3 % (ref 12.0–46.0)
Lymphs Abs: 1.2 10*3/uL (ref 0.7–4.0)
MCHC: 33.7 g/dL (ref 30.0–36.0)
MCV: 93.6 fl (ref 78.0–100.0)
MONOS PCT: 5.9 % (ref 3.0–12.0)
Monocytes Absolute: 0.2 10*3/uL (ref 0.1–1.0)
Neutro Abs: 2.5 10*3/uL (ref 1.4–7.7)
Neutrophils Relative %: 62.3 % (ref 43.0–77.0)
Platelets: 171 10*3/uL (ref 150.0–400.0)
RBC: 4.41 Mil/uL (ref 3.87–5.11)
RDW: 12.4 % (ref 11.5–15.5)
WBC: 4 10*3/uL (ref 4.0–10.5)

## 2016-02-11 LAB — TSH: TSH: 2.18 u[IU]/mL (ref 0.35–4.50)

## 2016-02-11 MED ORDER — PRAMIPEXOLE DIHYDROCHLORIDE 1 MG PO TABS: 1.5000 mg | ORAL_TABLET | Freq: Every day | ORAL | 1 refills | Status: DC

## 2016-02-11 MED ORDER — TRAMADOL HCL 50 MG PO TABS: 50.0000 mg | ORAL_TABLET | Freq: Two times a day (BID) | ORAL | 0 refills | Status: DC | PRN

## 2016-02-11 MED ORDER — OMEPRAZOLE 40 MG PO CPDR: 40.0000 mg | DELAYED_RELEASE_CAPSULE | Freq: Every day | ORAL | 3 refills | Status: DC

## 2016-02-11 NOTE — Assessment & Plan Note (Signed)
New to provider, ongoing for pt.  sxs are typically well controlled w/ tylenol but will take Imitrex prn.  Currently asymptomatic.  No changes at this time.  Will follow.

## 2016-02-11 NOTE — Patient Instructions (Signed)
Schedule your complete physical in 6 months We'll notify you of your lab results and make any changes if needed Keep up the good work on healthy diet and regular exercise- you look great!!! Call with any questions or concerns Welcome!  We're glad to have you!!! 

## 2016-02-11 NOTE — Assessment & Plan Note (Signed)
New to provider, ongoing for pt.  Following w/ GYN.  No changes at this time.

## 2016-02-11 NOTE — Assessment & Plan Note (Signed)
New to provider, ongoing for pt.  sxs are currently well controlled on Mirapex but pt continues to struggle w/ severe insomnia and daytime fatigue.  Reports trazodone 'works as well as any' and has not yet found a medication to improve her sleep.  Will follow closely for pt.

## 2016-02-11 NOTE — Progress Notes (Signed)
   Subjective:    Patient ID: Debra Matthews, female    DOB: 03/16/1956, 60 y.o.   MRN: [id number]  HPI New to establish.  Previous MD- [place]oberlein.  GYN- [facility]  RLS- chronic problem, on Mirapex and Trazodone.  Pt reports she is sleeping only 4 hrs/night.  No relief w/ Belsomra, Ambien, Lunesta.  Pt reports the Mirapex controls the sxs quite well.  GERD- chronic problem, taking Omeprazole and Zantac daily.  Seeing Dr Havery Moros.  sxs are well controlled- denies nocturnal sxs.  HRT- following w/ Dr Harrington Challenger.  On Vagifem for vaginal dryness.  Migraines- chronic problem, on Imitrex prn but can typically manage w/ tylenol 325mg .  sxs are much better since pt stopped having periods.  Will get a 'true' migraine ~1x/month.  Currently asymptomatic.    Anemia- ongoing issue for pt.  Was seeing Hematology (Dr Jacquiline Doe) and numbers improved considerably after iron infusions.  Labs in June were normal.  Pt reports severe fatigue recently- 'i'm nodding off'.  No CP, SOB, edema.   Review of Systems For ROS see HPI     Objective:   Physical Exam  Constitutional: She is oriented to person, place, and time. She appears well-developed and well-nourished. No distress.  HENT:  Head: Normocephalic and atraumatic.  Eyes: Conjunctivae and EOM are normal. Pupils are equal, round, and reactive to light.  Neck: Normal range of motion. Neck supple. No thyromegaly present.  Cardiovascular: Normal rate, regular rhythm, normal heart sounds and intact distal pulses.   No murmur heard. Pulmonary/Chest: Effort normal and breath sounds normal. No respiratory distress.  Abdominal: Soft. She exhibits no distension. There is no tenderness.  Musculoskeletal: She exhibits no edema.  Lymphadenopathy:    She has no cervical adenopathy.  Neurological: She is alert and oriented to person, place, and time.  Skin: Skin is warm and dry.  Psychiatric: She has a normal mood and affect. Her behavior is normal.  Vitals  reviewed.         Assessment & Plan:

## 2016-02-11 NOTE — Assessment & Plan Note (Signed)
New to provider, ongoing for pt.  Well controlled on PPI and H2 blocker daily.  Reviewed lifestyle and dietary modifications.  Will follow.

## 2016-02-11 NOTE — Assessment & Plan Note (Signed)
New to provider, ongoing for pt.  Due to her current fatigue, check labs- CBC, iron panel, TSH, and BMP.  Replete iron prn or correct other electrolyte abnormalities.  Pt expressed understanding and is in agreement w/ plan.

## 2016-02-11 NOTE — Progress Notes (Signed)
Pre visit review using our clinic review tool, if applicable. No additional management support is needed unless otherwise documented below in the visit note. 

## 2016-02-12 ENCOUNTER — Encounter: Payer: Self-pay | Admitting: General Practice

## 2016-02-12 ENCOUNTER — Inpatient Hospital Stay: Admit: 2016-02-12 | Payer: BLUE CROSS/BLUE SHIELD | Attending: Pain Medicine | Primary: Internal Medicine

## 2016-02-12 DIAGNOSIS — S83281A Other tear of lateral meniscus, current injury, right knee, initial encounter: Secondary | ICD-10-CM

## 2016-02-15 ENCOUNTER — Encounter: Payer: Self-pay | Admitting: Family Medicine

## 2016-03-17 ENCOUNTER — Encounter: Payer: Self-pay | Admitting: General Practice

## 2016-03-17 ENCOUNTER — Ambulatory Visit (INDEPENDENT_AMBULATORY_CARE_PROVIDER_SITE_OTHER): Payer: 59 | Admitting: Family Medicine

## 2016-03-17 ENCOUNTER — Encounter: Payer: Self-pay | Admitting: Family Medicine

## 2016-03-17 DIAGNOSIS — F411 Generalized anxiety disorder: Secondary | ICD-10-CM | POA: Diagnosis not present

## 2016-03-17 MED ORDER — SERTRALINE HCL 50 MG PO TABS: 50.0000 mg | ORAL_TABLET | Freq: Every day | ORAL | 3 refills | Status: DC

## 2016-03-17 NOTE — Patient Instructions (Signed)
Follow up in 4-6 weeks to recheck anxiety Start the Sertraline at dinner- start w/ 1/2 tab for 6 days and then increase to 1 tab daily Try and set a schedule for both of you to allow you both to get things done Have a conversation with him about doing chores together or asking for help Call with any questions or concerns Hang in there!!!

## 2016-03-17 NOTE — Progress Notes (Signed)
   Subjective:    Patient ID: Debra Matthews, female    DOB: [birth year], 60 y.o.   MRN: [id number]  HPI Fatigue- pt inquired by MyChart email as to whether the Lexapro would improve her fatigue.  She did restart temporarily but 'I didn't feel it made a difference'.  Pt admits to being 'anxious, frustrated, upset'.  Pt has increased daytime sleepiness when on the Trazodone so she stopped this.  Pt reports feeling 'shaky inside'.  Pt is feeling smothered by her husband's recent retirement.   Review of Systems For ROS see HPI     Objective:   Physical Exam  Constitutional: She is oriented to person, place, and time. She appears well-developed and well-nourished. No distress.  HENT:  Head: Normocephalic and atraumatic.  Neurological: She is alert and oriented to person, place, and time.  Skin: Skin is warm and dry.  Psychiatric: She has a normal mood and affect. Her behavior is normal. Thought content normal.  Vitals reviewed.         Assessment & Plan:

## 2016-03-17 NOTE — Progress Notes (Signed)
Pre visit review using our clinic review tool, if applicable. No additional management support is needed unless otherwise documented below in the visit note. 

## 2016-03-17 NOTE — Assessment & Plan Note (Addendum)
New to provider, ongoing for pt.  She is having difficulty sleeping but this is ongoing for her.  Her biggest stressor is her husband's recent retirement and adjusting to him in the house.  Start Sertraline daily.  Encouraged her to open the lines of communication w/ husband to improve her daily schedule and allow for time to get things done.  Will continue to follow.  Total time spent w/ pt, >30 minutes and >50% spent counseling.

## 2016-04-10 ENCOUNTER — Other Ambulatory Visit: Payer: Self-pay | Admitting: Family Medicine

## 2016-05-05 ENCOUNTER — Encounter: Payer: Self-pay | Admitting: Family Medicine

## 2016-05-05 ENCOUNTER — Ambulatory Visit (INDEPENDENT_AMBULATORY_CARE_PROVIDER_SITE_OTHER): Payer: 59 | Admitting: Family Medicine

## 2016-05-05 VITALS — BP 130/78 | HR 72 | Temp 97.9°F | Resp 16 | Ht 62.0 in | Wt 139.4 lb

## 2016-05-05 DIAGNOSIS — F411 Generalized anxiety disorder: Secondary | ICD-10-CM | POA: Diagnosis not present

## 2016-05-05 MED ORDER — SERTRALINE HCL 100 MG PO TABS: 100.0000 mg | ORAL_TABLET | Freq: Every day | ORAL | 3 refills | Status: DC

## 2016-05-05 NOTE — Progress Notes (Signed)
Pre visit review using our clinic review tool, if applicable. No additional management support is needed unless otherwise documented below in the visit note. 

## 2016-05-05 NOTE — Progress Notes (Signed)
   Subjective:    Patient ID: Debra Matthews, female    DOB: 07-01-1955, 60 y.o.   MRN: [id number]  HPI Anxiety- pt reports she's feeling pretty well but admits to high amounts of stress due to the upcoming holidays and her parents visiting.  Pt is not sleeping any better on the Sertraline but she does feel her anxiety is mildly improved.  Pt is open to idea of increasing medication for better sxs control.  Pt is very upset about her 3 lb weight gain.   Review of Systems For ROS see HPI     Objective:   Physical Exam  Constitutional: She is oriented to person, place, and time. She appears well-developed and well-nourished. No distress.  HENT:  Head: Normocephalic and atraumatic.  Neurological: She is alert and oriented to person, place, and time.  Skin: Skin is warm and dry.  Psychiatric: She has a normal mood and affect. Her behavior is normal. Thought content normal.  Vitals reviewed.         Assessment & Plan:

## 2016-05-05 NOTE — Assessment & Plan Note (Signed)
Mild improvement on Sertraline but the holidays are stressing her out.  Will increase Sertraline to 100mg  daily and again encouraged her to communicate w/ husband to improve her stress.  Will follow closely.

## 2016-05-05 NOTE — Patient Instructions (Signed)
Follow up by phone or MyChart after the holidays to let me know how the anxiety is doing Increase the Sertraline to 100mg  daily (2 of what you have at home and 1 of the new prescription) Continue to give your husband directives to help you get what you need to do done! Call with any questions or concerns Happy Holidays!!!

## 2016-06-06 ENCOUNTER — Telehealth: Payer: Self-pay | Admitting: *Deleted

## 2016-06-06 ENCOUNTER — Encounter: Payer: Self-pay | Admitting: Family Medicine

## 2016-06-06 NOTE — Telephone Encounter (Signed)
Left message for patient to call back regarding getting in with [facility] next week for hip pain.

## 2016-06-11 ENCOUNTER — Encounter: Payer: Self-pay | Admitting: Family Medicine

## 2016-06-11 ENCOUNTER — Ambulatory Visit (INDEPENDENT_AMBULATORY_CARE_PROVIDER_SITE_OTHER): Payer: 59 | Admitting: Family Medicine

## 2016-06-11 VITALS — BP 120/81 | HR 78 | Temp 98.1°F | Resp 16 | Ht 62.0 in | Wt 141.1 lb

## 2016-06-11 DIAGNOSIS — M7062 Trochanteric bursitis, left hip: Secondary | ICD-10-CM

## 2016-06-11 DIAGNOSIS — M7061 Trochanteric bursitis, right hip: Secondary | ICD-10-CM

## 2016-06-11 DIAGNOSIS — Z23 Encounter for immunization: Secondary | ICD-10-CM | POA: Diagnosis not present

## 2016-06-11 MED ORDER — NAPROXEN 500 MG PO TABS: 500.0000 mg | ORAL_TABLET | Freq: Two times a day (BID) | ORAL | 0 refills | Status: DC

## 2016-06-11 NOTE — Patient Instructions (Signed)
Follow up as needed We'll call you with your Orthopedic appt Start the Naproxen twice daily- take w/ food Alternate ice/heat for pain relief Call with any questions or concerns Hang in there!! Happy New Year!!!

## 2016-06-11 NOTE — Progress Notes (Signed)
   Subjective:    Patient ID: Debra Matthews, female    DOB: Nov 05, 1955, 60 y.o.   MRN: [id number]  HPI Hip pain- bilateral hip pain 'for several years' laterally over trochanteric bursa.  R leg is worse than L.  Pain is radiating down front of R thigh x1 month.  No improvement w/ tylenol, mild improvement w/ tramadol.  No pain in groin.  Painful to lie on either hip.     Review of Systems For ROS see HPI     Objective:   Physical Exam  Constitutional: She is oriented to person, place, and time. She appears well-developed and well-nourished. No distress.  HENT:  Head: Normocephalic and atraumatic.  Cardiovascular: Intact distal pulses.   Musculoskeletal: She exhibits tenderness (TTP over bilateral trochanteric bursa). She exhibits no edema.  (-) SLR bilaterally No joint line tenderness of R knee No pain w/ flexion/extension of R knee  Neurological: She is alert and oriented to person, place, and time.  Skin: Skin is warm and dry. No rash noted. No erythema.  Vitals reviewed.         Assessment & Plan:  Bilateral trochanteric bursitis- new to provider, recurrent for pt.  Intolerant to oral steroids.  Start scheduled NSAIDs.  Refer to ortho for likely injxns.  Reviewed supportive care and red flags that should prompt return.  Pt expressed understanding and is in agreement w/ plan.

## 2016-06-11 NOTE — Progress Notes (Signed)
Pre visit review using our clinic review tool, if applicable. No additional management support is needed unless otherwise documented below in the visit note. 

## 2016-06-17 ENCOUNTER — Other Ambulatory Visit: Payer: Self-pay | Admitting: Family Medicine

## 2016-07-09 ENCOUNTER — Ambulatory Visit (INDEPENDENT_AMBULATORY_CARE_PROVIDER_SITE_OTHER): Payer: 59

## 2016-07-09 ENCOUNTER — Ambulatory Visit (INDEPENDENT_AMBULATORY_CARE_PROVIDER_SITE_OTHER): Payer: 59 | Admitting: Orthopaedic Surgery

## 2016-07-09 ENCOUNTER — Encounter: Payer: Self-pay | Admitting: Orthopaedic Surgery

## 2016-07-09 VITALS — BP 118/73 | HR 77 | Temp 97.7°F | Ht 64.0 in | Wt 142.0 lb

## 2016-07-09 DIAGNOSIS — M25561 Pain in right knee: Secondary | ICD-10-CM

## 2016-07-09 NOTE — Progress Notes (Signed)
Subjective:    Patient ID: Debra Matthews, female    DOB: [birth year], 61 y.o.   MRN: [id number]  HPI  She has had pain in the right knee since November, 2017.  It has been gradually been getting worse and worse.  She has no swelling but has popping.  She has giving way of the knee.  She has lateral knee pain and posterior knee pain on the right.  She was seen by her family doctor in December, 2017.  She was given Naprosyn but was told not to take it regular as she has history of gastritis.  She has used heat and ice but likes the ice more.  She has a knee wrap.  I have given Rx for crutches today.  She limps badly to the right.    Review of Systems  HENT: Negative for congestion.   Respiratory: Negative for cough and shortness of breath.   Cardiovascular: Negative for chest pain and leg swelling.  Endocrine: Positive for cold intolerance.  Musculoskeletal: Positive for arthralgias and gait problem.  Allergic/Immunologic: Positive for environmental allergies.   Past Medical History:  Diagnosis Date  . Abdominal pain, epigastric   . Anemia   . Arthritis   . Chronic constipation    in past  . Cyst in hand    right wrist  . 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 GERD (gastroesophageal reflux disease)   . History of chicken pox   . History of shingles   . RLS (restless legs syndrome)     Past Surgical History:  Procedure Laterality Date  . TUBAL LIGATION      Current Outpatient Prescriptions on File Prior to Visit  Medication Sig Dispense Refill  . acetaminophen (TYLENOL) 325 MG tablet Take 650 mg by mouth as needed.    . Estradiol (VAGIFEM) 10 MCG TABS vaginal tablet Place vaginally. Use 2 times per month    . naproxen (NAPROSYN) 500 MG tablet Take 1 tablet (500 mg total) by mouth 2 (two) times daily with a meal. 60 tablet 0  . omeprazole (PRILOSEC) 40 MG capsule Take 1 capsule (40 mg total) by mouth daily. 90 capsule 3  . pramipexole (MIRAPEX) 1 MG  tablet TAKE 1.5 TABLETS (1.5 MG TOTAL) BY MOUTH DAILY. 90 tablet 1  . ranitidine (ZANTAC) 150 MG tablet Take 150 mg by mouth as needed for heartburn. Alternating with Pepcid    . SUMAtriptan (IMITREX) 100 MG tablet TAKE 1 TABLET BY MOUTH EVERY DAY (DO NOT EXCEED 2 IN 24 HOURS) 30 tablet 3  . valACYclovir (VALTREX) 500 MG tablet     . Wheat Dextrin (BENEFIBER PO) Take by mouth. Take 1 tablespoon 2 to 3 times a  week    . sertraline (ZOLOFT) 100 MG tablet Take 1 tablet (100 mg total) by mouth daily. (Patient not taking: Reported on 07/09/2016) 30 tablet 3  . traMADol (ULTRAM) 50 MG tablet Take 1 tablet (50 mg total) by mouth every 12 (twelve) hours as needed. (Patient not taking: Reported on 07/09/2016) 30 tablet 0  . traZODone (DESYREL) 100 MG tablet Take 1 tablet by every 3-4 times per week    . tretinoin (RETIN-A) 0.025 % cream APPLY ON THE SKIN AT BEDTIME APPLY TO FACE AT BEDTIME FOR ACNE  3   No current facility-administered medications on file prior to visit.     Social History   Social History  . Marital status: Married    Spouse name: N/A  .  Number of children: 3  . Years of education: N/A   Occupational History  . retired    Social History Main Topics  . Smoking status: Never Smoker  . Smokeless tobacco: Never Used  . Alcohol use No  . Drug use: No  . Sexual activity: Not on file   Other Topics Concern  . Not on file   Social History Narrative  . No narrative on file    Family History  Problem Relation Age of Onset  . Scoliosis Mother   . Mitral valve prolapse Mother   . Colon cancer Maternal Aunt     BP 118/73   Pulse 77   Temp 97.7 F (36.5 C)   Ht 5\' 4"  (1.626 m)   Wt 142 lb (64.4 kg)   BMI 24.37 kg/m      Objective:   Physical Exam  Constitutional: She is oriented to person, place, and time. She appears well-developed and well-nourished.  HENT:  Head: Normocephalic and atraumatic.  Eyes: Conjunctivae and EOM are normal. Pupils are equal, round, and  reactive to light.  Neck: Normal range of motion. Neck supple.  Cardiovascular: Normal rate, regular rhythm and intact distal pulses.   Pulmonary/Chest: Effort normal.  Abdominal: Soft.  Musculoskeletal: She exhibits tenderness (Pain right knee, ROM 0 to 90 with pain, no effusion, pain lateral joint line, 1+ laxity LCL, drawer negative, some tenderness popliteal area, NV intact, + lateral McMurray, limp right, left knee negative.).  Neurological: She is alert and oriented to person, place, and time. She displays normal reflexes. No cranial nerve deficit. She exhibits normal muscle tone. Coordination normal.  Skin: Skin is warm and dry.  Psychiatric: She has a normal mood and affect. Her behavior is normal. Judgment and thought content normal.     X-rays were done of the right knee and reported separately.     Assessment & Plan:   Encounter Diagnosis  Name Primary?  . Acute pain of right knee Yes   I think she has a tear of the lateral meniscus of the right knee.  She has giving way, positive McMurray, no help with conservative treatment.  She needs a MRI for surgical planning.  I have given Rx for crutches.  Return after MRI.  Call if any problem.  PROCEDURE NOTE:  The patient requests injections of the right knee , verbal consent was obtained.  The right knee was prepped appropriately after time out was performed.   Sterile technique was observed and injection of 1 cc of Depo-Medrol 40 mg with several cc's of plain xylocaine. Anesthesia was provided by ethyl chloride and a 20-gauge needle was used to inject the knee area. The injection was tolerated well.  A band aid dressing was applied.  The patient was advised to apply ice later today and tomorrow to the injection sight as needed.  Electronically Signed Sanjuana Kava, MD 1/24/20188:59 AM

## 2016-07-11 ENCOUNTER — Ambulatory Visit (HOSPITAL_COMMUNITY)
Admission: RE | Admit: 2016-07-11 | Discharge: 2016-07-11 | Disposition: A | Discharge status: Home / Self Care | Payer: 59 | Source: Ambulatory Visit | Attending: Orthopaedic Surgery | Admitting: Orthopaedic Surgery

## 2016-07-11 DIAGNOSIS — M25561 Pain in right knee: Secondary | ICD-10-CM | POA: Insufficient documentation

## 2016-07-11 DIAGNOSIS — M84451A Pathological fracture, right femur, initial encounter for fracture: Secondary | ICD-10-CM | POA: Insufficient documentation

## 2016-07-11 DIAGNOSIS — M23306 Other meniscus derangements, unspecified meniscus, right knee: Secondary | ICD-10-CM | POA: Diagnosis not present

## 2016-07-15 ENCOUNTER — Encounter: Payer: Self-pay | Admitting: Orthopaedic Surgery

## 2016-07-15 ENCOUNTER — Ambulatory Visit (INDEPENDENT_AMBULATORY_CARE_PROVIDER_SITE_OTHER): Payer: 59 | Admitting: Orthopaedic Surgery

## 2016-07-15 VITALS — BP 143/78 | HR 78 | Temp 97.7°F | Ht 64.0 in | Wt 139.0 lb

## 2016-07-15 DIAGNOSIS — M84451A Pathological fracture, right femur, initial encounter for fracture: Secondary | ICD-10-CM | POA: Diagnosis not present

## 2016-07-15 DIAGNOSIS — M8000XA Age-related osteoporosis with current pathological fracture, unspecified site, initial encounter for fracture: Secondary | ICD-10-CM

## 2016-07-15 HISTORY — DX: Pathological fracture, right femur, initial encounter for fracture: M84.451A

## 2016-07-15 NOTE — Progress Notes (Signed)
Patient [id number] Debra Matthews, female DOB:1955/08/31, 61 y.o. [id number]  Chief Complaint  Patient presents with  . Results    MRI Right Knee    HPI  Debra Matthews is a 61 y.o. female who has had right knee pain.  She had a MRI of the right knee 07-11-16 showing: IMPRESSION: Femoral insufficiency fracture involving the weight-bearing portion of the medial femoral condyle with slight flattening of the overlying femoral cortex. The tibial plateau opposite this area is unremarkable.  Mucoid degeneration of the menisci without surfacing tear. Intact ACL, PCL and collateral ligaments.  I have gone over the results of the MRI.  She is to continue the crutches with no weight bearing.  I have told her it will take about eight weeks or so for this to heal and she needs to have no weight bearing.  She may develop degenerative changes in the knee over time and may need total knee in the future.  She will need a bone density study.  I have ordered this.  HPI  Body mass index is 23.86 kg/m.  ROS  Review of Systems  HENT: Negative for congestion.   Respiratory: Negative for cough and shortness of breath.   Cardiovascular: Negative for chest pain and leg swelling.  Endocrine: Positive for cold intolerance.  Musculoskeletal: Positive for arthralgias and gait problem.  Allergic/Immunologic: Positive for environmental allergies.    Past Medical History:  Diagnosis Date  . Abdominal pain, epigastric   . Anemia   . Arthritis   . Chronic constipation    in past  . Cyst in hand    right wrist  . 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 GERD (gastroesophageal reflux disease)   . History of chicken pox   . History of shingles   . RLS (restless legs syndrome)   . Subchondral insufficiency fracture of femoral condyle, right, initial encounter ([place]) 07/15/2016    Past Surgical History:  Procedure Laterality Date  . TUBAL LIGATION      Family History  Problem  Relation Age of Onset  . Scoliosis Mother   . Mitral valve prolapse Mother   . Colon cancer Maternal Aunt     Social History Social History  Substance Use Topics  . Smoking status: Never Smoker  . Smokeless tobacco: Never Used  . Alcohol use No    Allergies  Allergen Reactions  . Pseudoephedrine Other (See Comments)    insomnia  . Ondansetron Other (See Comments)  . Prednisone     Pt feels bad, headache, no appetite.    Current Outpatient Prescriptions  Medication Sig Dispense Refill  . acetaminophen (TYLENOL) 325 MG tablet Take 650 mg by mouth as needed.    . Estradiol (VAGIFEM) 10 MCG TABS vaginal tablet Place vaginally. Use 2 times per month    . naproxen (NAPROSYN) 500 MG tablet Take 1 tablet (500 mg total) by mouth 2 (two) times daily with a meal. 60 tablet 0  . omeprazole (PRILOSEC) 40 MG capsule Take 1 capsule (40 mg total) by mouth daily. 90 capsule 3  . pramipexole (MIRAPEX) 1 MG tablet TAKE 1.5 TABLETS (1.5 MG TOTAL) BY MOUTH DAILY. 90 tablet 1  . ranitidine (ZANTAC) 150 MG tablet Take 150 mg by mouth as needed for heartburn. Alternating with Pepcid    . sertraline (ZOLOFT) 100 MG tablet Take 1 tablet (100 mg total) by mouth daily. (Patient not taking: Reported on 07/09/2016) 30 tablet 3  . SUMAtriptan (IMITREX) 100 MG  tablet TAKE 1 TABLET BY MOUTH EVERY DAY (DO NOT EXCEED 2 IN 24 HOURS) 30 tablet 3  . traMADol (ULTRAM) 50 MG tablet Take 1 tablet (50 mg total) by mouth every 12 (twelve) hours as needed. (Patient not taking: Reported on 07/09/2016) 30 tablet 0  . traZODone (DESYREL) 100 MG tablet Take 1 tablet by every 3-4 times per week    . tretinoin (RETIN-A) 0.025 % cream APPLY ON THE SKIN AT BEDTIME APPLY TO FACE AT BEDTIME FOR ACNE  3  . valACYclovir (VALTREX) 500 MG tablet     . Wheat Dextrin (BENEFIBER PO) Take by mouth. Take 1 tablespoon 2 to 3 times a  week     No current facility-administered medications for this visit.      Physical Exam  Blood pressure  (!) 143/78, pulse 78, temperature 97.7 F (36.5 C), height 5\' 4"  (1.626 m), weight 139 lb (63 kg).  Constitutional: overall normal hygiene, normal nutrition, well developed, normal grooming, normal body habitus. Assistive device:crutches  Musculoskeletal: gait and station Limp right, muscle tone and strength are normal, no tremors or atrophy is present.  .  Neurological: coordination overall normal.  Deep tendon reflex/nerve stretch intact.  Sensation normal.  Cranial nerves II-XII intact.   Skin:   Normal overall no scars, lesions, ulcers or rashes. No psoriasis.  Psychiatric: Alert and oriented x 3.  Recent memory intact, remote memory unclear.  Normal mood and affect. Well groomed.  Good eye contact.  Cardiovascular: overall no swelling, no varicosities, no edema bilaterally, normal temperatures of the legs and arms, no clubbing, cyanosis and good capillary refill.  Lymphatic: palpation is normal.  Her right knee has slight effusion and ROM of 0 to 110 today. She is tender medially.  She has no redness.  NV intact.  The patient has been educated about the nature of the problem(s) and counseled on treatment options.  The patient appeared to understand what I have discussed and is in agreement with it.  Encounter Diagnoses  Name Primary?  . Subchondral insufficiency fracture of femoral condyle, right, initial encounter ([facility])   . Age-related osteoporosis with current pathological fracture, initial encounter Yes    PLAN Call if any problems.  Precautions discussed.  Continue current medications.   Return to clinic 2 weeks    X-rays on return.  Electronically Signed Sanjuana Kava, MD 1/30/201810:36 AM

## 2016-07-16 ENCOUNTER — Ambulatory Visit (HOSPITAL_COMMUNITY)
Admission: RE | Admit: 2016-07-16 | Discharge: 2016-07-16 | Disposition: A | Discharge status: Home / Self Care | Payer: 59 | Source: Ambulatory Visit | Attending: Orthopaedic Surgery | Admitting: Orthopaedic Surgery

## 2016-07-16 DIAGNOSIS — M85852 Other specified disorders of bone density and structure, left thigh: Secondary | ICD-10-CM | POA: Insufficient documentation

## 2016-07-16 DIAGNOSIS — M8000XA Age-related osteoporosis with current pathological fracture, unspecified site, initial encounter for fracture: Secondary | ICD-10-CM | POA: Insufficient documentation

## 2016-07-16 DIAGNOSIS — M8588 Other specified disorders of bone density and structure, other site: Secondary | ICD-10-CM | POA: Diagnosis not present

## 2016-07-16 DIAGNOSIS — M818 Other osteoporosis without current pathological fracture: Secondary | ICD-10-CM | POA: Diagnosis not present

## 2016-07-29 ENCOUNTER — Ambulatory Visit (INDEPENDENT_AMBULATORY_CARE_PROVIDER_SITE_OTHER): Payer: 59 | Admitting: Orthopaedic Surgery

## 2016-07-29 ENCOUNTER — Ambulatory Visit (INDEPENDENT_AMBULATORY_CARE_PROVIDER_SITE_OTHER): Payer: 59

## 2016-07-29 ENCOUNTER — Encounter: Payer: Self-pay | Admitting: Orthopaedic Surgery

## 2016-07-29 VITALS — BP 140/71 | HR 79 | Temp 98.1°F | Ht 64.0 in | Wt 142.0 lb

## 2016-07-29 DIAGNOSIS — S82141D Displaced bicondylar fracture of right tibia, subsequent encounter for closed fracture with routine healing: Secondary | ICD-10-CM | POA: Diagnosis not present

## 2016-07-29 DIAGNOSIS — M8000XD Age-related osteoporosis with current pathological fracture, unspecified site, subsequent encounter for fracture with routine healing: Secondary | ICD-10-CM

## 2016-07-29 NOTE — Progress Notes (Signed)
CC:  My knee is not hurting as much  She has less right knee pain. She is using her crutches.  Her bone scan shows osteopororis.  I have told her of the findings.  I have recommended she see her family doctor and get vitamin D levels.  I have suggested 2000 IU of vitamin D daily for now.  I have asked her to Google about calcium containing foods.  Encounter Diagnoses  Name Primary?  . Closed fracture of right tibial plateau with routine healing, subsequent encounter Yes  . Age-related osteoporosis with current pathological fracture with routine healing, subsequent encounter    Return in one month.  X-rays of the right knee then.  Call if any problem  Continue crutches  Precautions discussed.  Electronically Signed Sanjuana Kava, MD 2/13/20189:23 AM

## 2016-08-13 ENCOUNTER — Encounter: Payer: Self-pay | Admitting: Family Medicine

## 2016-08-13 ENCOUNTER — Ambulatory Visit (INDEPENDENT_AMBULATORY_CARE_PROVIDER_SITE_OTHER): Payer: 59 | Admitting: Family Medicine

## 2016-08-13 DIAGNOSIS — M8000XA Age-related osteoporosis with current pathological fracture, unspecified site, initial encounter for fracture: Secondary | ICD-10-CM | POA: Diagnosis not present

## 2016-08-13 DIAGNOSIS — G4700 Insomnia, unspecified: Secondary | ICD-10-CM | POA: Diagnosis not present

## 2016-08-13 DIAGNOSIS — Z0000 Encounter for general adult medical examination without abnormal findings: Secondary | ICD-10-CM | POA: Diagnosis not present

## 2016-08-13 DIAGNOSIS — G5603 Carpal tunnel syndrome, bilateral upper limbs: Secondary | ICD-10-CM | POA: Diagnosis not present

## 2016-08-13 DIAGNOSIS — M810 Age-related osteoporosis without current pathological fracture: Secondary | ICD-10-CM | POA: Insufficient documentation

## 2016-08-13 LAB — BASIC METABOLIC PANEL
BUN: 16 mg/dL (ref 6–23)
CHLORIDE: 105 meq/L (ref 96–112)
CO2: 26 meq/L (ref 19–32)
CREATININE: 0.66 mg/dL (ref 0.40–1.20)
Calcium: 9.6 mg/dL (ref 8.4–10.5)
GFR: 96.5 mL/min (ref >60.00)
Glucose, Bld: 94 mg/dL (ref 70–99)
Potassium: 4.2 mEq/L (ref 3.5–5.1)
Sodium: 139 mEq/L (ref 135–145)

## 2016-08-13 LAB — CBC WITH DIFFERENTIAL/PLATELET
BASOS ABS: 0.1 10*3/uL (ref 0.0–0.1)
Basophils Relative: 1.1 % (ref 0.0–3.0)
EOS ABS: 0.1 10*3/uL (ref 0.0–0.7)
Eosinophils Relative: 2 % (ref 0.0–5.0)
HEMATOCRIT: 40.3 % (ref 36.0–46.0)
HEMOGLOBIN: 13.8 g/dL (ref 12.0–15.0)
LYMPHS PCT: 35.2 % (ref 12.0–46.0)
Lymphs Abs: 1.6 10*3/uL (ref 0.7–4.0)
MCHC: 34.2 g/dL (ref 30.0–36.0)
MCV: 92.1 fl (ref 78.0–100.0)
MONOS PCT: 6.6 % (ref 3.0–12.0)
Monocytes Absolute: 0.3 10*3/uL (ref 0.1–1.0)
Neutro Abs: 2.5 10*3/uL (ref 1.4–7.7)
Neutrophils Relative %: 55.1 % (ref 43.0–77.0)
PLATELETS: 177 10*3/uL (ref 150.0–400.0)
RBC: 4.38 Mil/uL (ref 3.87–5.11)
RDW: 12.9 % (ref 11.5–15.5)
WBC: 4.6 10*3/uL (ref 4.0–10.5)

## 2016-08-13 LAB — HEPATIC FUNCTION PANEL
ALBUMIN: 4.6 g/dL (ref 3.5–5.2)
ALK PHOS: 54 U/L (ref 39–117)
ALT: 19 U/L (ref 0–35)
AST: 22 U/L (ref 0–37)
Bilirubin, Direct: 0.2 mg/dL (ref 0.0–0.3)
Total Bilirubin: 1.2 mg/dL (ref 0.2–1.2)
Total Protein: 7.1 g/dL (ref 6.0–8.3)

## 2016-08-13 LAB — LIPID PANEL
CHOL/HDL RATIO: 3
Cholesterol: 261 mg/dL — ABNORMAL HIGH (ref 0–200)
HDL: 79.8 mg/dL (ref >39.00)
LDL Cholesterol: 167 mg/dL — ABNORMAL HIGH (ref 0–99)
NonHDL: 180.99
TRIGLYCERIDES: 69 mg/dL (ref 0.0–149.0)
VLDL: 13.8 mg/dL (ref 0.0–40.0)

## 2016-08-13 LAB — VITAMIN D 25 HYDROXY (VIT D DEFICIENCY, FRACTURES): VITD: 33.35 ng/mL (ref 30.00–100.00)

## 2016-08-13 LAB — TSH: TSH: 1.41 u[IU]/mL (ref 0.35–4.50)

## 2016-08-13 MED ORDER — ZOLPIDEM TARTRATE ER 12.5 MG PO TBCR: 12.5000 mg | EXTENDED_RELEASE_TABLET | Freq: Every evening | ORAL | 0 refills | Status: DC | PRN

## 2016-08-13 MED ORDER — ALENDRONATE SODIUM 70 MG PO TABS: 70.0000 mg | ORAL_TABLET | ORAL | 11 refills | Status: DC

## 2016-08-13 NOTE — Assessment & Plan Note (Signed)
Pt's PE WNL w/ exception of R knee fx.  UTD on GYN, colonoscopy, immunizations.  Check labs.  Anticipatory guidance provided.

## 2016-08-13 NOTE — Assessment & Plan Note (Signed)
Recurrent problem for pt.  Pt reports Trazodone hangover, no improvement w/ Belsomra (and high cost).  She feels Ambien CR was the most effective.  Will retry.  Pt expressed understanding and is in agreement w/ plan.

## 2016-08-13 NOTE — Patient Instructions (Addendum)
Follow up in 1 year or as needed We'll notify you of your lab results and make any changes if needed Get bilateral wrist splints to wear at night and during the day as able to help w/ the carpal tunnel If no improvement in the numbness/tingling with the wrist splints, please discuss this with your orthopedist Start the Fosamax once weekly for the osteoporosis Add daily Calcium and Vit D (2 Caltrate or equivalent daily) Start the Ambien CR nightly for sleep Call with any questions or concerns Hang in there!!!

## 2016-08-13 NOTE — Progress Notes (Signed)
   Subjective:    Patient ID: Debra Matthews, female    DOB: 09-15-1955, 61 y.o.   MRN: [id number]  HPI CPE- UTD on colonoscopy, mammo, pap.  Pt had recent DEXA w/ ortho that showed Osteoporosis w/ medial femoral condyle insufficiency fx.     Review of Systems Patient reports no vision/ hearing changes, adenopathy,fever, weight change,  persistant/recurrent hoarseness , swallowing issues, chest pain, palpitations, edema, persistant/recurrent cough, hemoptysis, dyspnea (rest/exertional/paroxysmal nocturnal), gastrointestinal bleeding (melena, rectal bleeding), abdominal pain, significant heartburn, bowel changes, GU symptoms (dysuria, hematuria, incontinence), Gyn symptoms (abnormal  bleeding, pain),  syncope, focal weakness, memory loss, skin/hair/nail changes, abnormal bruising or bleeding, anxiety, or depression.   Bilateral hand numbness- using crutches for last 5 weeks. Insomnia- pt reports hangover on Trazodone, no improvement on Belsomra, has used Ambien in the past, 'the CR works better'.    Objective:   Physical Exam General Appearance:    Alert, cooperative, no distress, appears stated age  Head:    Normocephalic, without obvious abnormality, atraumatic  Eyes:    PERRL, conjunctiva/corneas clear, EOM's intact, fundi    benign, both eyes  Ears:    Normal TM's and external ear canals, both ears  Nose:   Nares normal, septum midline, mucosa normal, no drainage    or sinus tenderness  Throat:   Lips, mucosa, and tongue normal; teeth and gums normal  Neck:   Supple, symmetrical, trachea midline, no adenopathy;    Thyroid: no enlargement/tenderness/nodules  Back:     Symmetric, no curvature, ROM normal, no CVA tenderness  Lungs:     Clear to auscultation bilaterally, respirations unlabored  Chest Wall:    No tenderness or deformity   Heart:    Regular rate and rhythm, S1 and S2 normal, no murmur, rub   or gallop  Breast Exam:    Deferred to GYN  Abdomen:     Soft, non-tender, bowel  sounds active all four quadrants,    no masses, no organomegaly  Genitalia:    Deferred to GYN  Rectal:    Extremities:   Extremities normal, atraumatic, no cyanosis or edema  Pulses:   2+ and symmetric all extremities  Skin:   Skin color, texture, turgor normal, no rashes or lesions  Lymph nodes:   Cervical, supraclavicular, and axillary nodes normal  Neurologic:   CNII-XII intact, normal strength, sensation and reflexes    throughout          Assessment & Plan:

## 2016-08-13 NOTE — Assessment & Plan Note (Signed)
New.  Pt's sxs and hx are consistent w/ bilateral carpal tunnel.  Start nightly wrist splints- script provided.  If no improvement, pt to discuss w/ ortho.  Pt expressed understanding and is in agreement w/ plan.

## 2016-08-13 NOTE — Assessment & Plan Note (Signed)
New.  Reviewed pt's Ortho notes.  Will check Vit D and start Fosamax.  Reviewed need for additional daily Ca and Vit D.  Pt expressed understanding and is in agreement w/ plan.

## 2016-08-13 NOTE — Progress Notes (Signed)
Pre visit review using our clinic review tool, if applicable. No additional management support is needed unless otherwise documented below in the visit note. 

## 2016-08-14 ENCOUNTER — Other Ambulatory Visit (INDEPENDENT_AMBULATORY_CARE_PROVIDER_SITE_OTHER): Payer: 59

## 2016-08-14 ENCOUNTER — Encounter: Payer: Self-pay | Admitting: Family Medicine

## 2016-08-14 ENCOUNTER — Other Ambulatory Visit: Payer: Self-pay | Admitting: General Practice

## 2016-08-14 DIAGNOSIS — G5601 Carpal tunnel syndrome, right upper limb: Secondary | ICD-10-CM | POA: Diagnosis not present

## 2016-08-14 DIAGNOSIS — R5383 Other fatigue: Secondary | ICD-10-CM

## 2016-08-14 DIAGNOSIS — E785 Hyperlipidemia, unspecified: Secondary | ICD-10-CM

## 2016-08-14 DIAGNOSIS — G5602 Carpal tunnel syndrome, left upper limb: Secondary | ICD-10-CM | POA: Diagnosis not present

## 2016-08-14 LAB — VITAMIN B12: VITAMIN B 12: 328 pg/mL (ref 211–911)

## 2016-08-14 MED ORDER — ATORVASTATIN CALCIUM 20 MG PO TABS: 20.0000 mg | ORAL_TABLET | Freq: Every day | ORAL | 6 refills | Status: DC

## 2016-08-26 ENCOUNTER — Ambulatory Visit: Payer: 59 | Admitting: Orthopaedic Surgery

## 2016-08-27 ENCOUNTER — Ambulatory Visit (INDEPENDENT_AMBULATORY_CARE_PROVIDER_SITE_OTHER): Payer: 59 | Admitting: Orthopaedic Surgery

## 2016-08-27 ENCOUNTER — Ambulatory Visit (INDEPENDENT_AMBULATORY_CARE_PROVIDER_SITE_OTHER): Payer: 59

## 2016-08-27 DIAGNOSIS — S82141D Displaced bicondylar fracture of right tibia, subsequent encounter for closed fracture with routine healing: Secondary | ICD-10-CM | POA: Diagnosis not present

## 2016-08-27 DIAGNOSIS — S72491D Other fracture of lower end of right femur, subsequent encounter for closed fracture with routine healing: Secondary | ICD-10-CM

## 2016-08-27 NOTE — Progress Notes (Signed)
CC:  My knee is not hurting.  She has been using her crutches with no weight bearing on the right knee. She has no swelling, minimal tenderness and no trauma.  X-rays were done and reported separately.  NV intact  ROM of the right knee is full.  Encounter Diagnosis  Name Primary?  . Other closed fracture of distal end of right femur with routine healing, subsequent encounter Yes   I have told her to gradually begin weight bearing.  I will see her in two weeks.  Call if any problem.  X-rays on return.  Electronically Signed Sanjuana Kava, MD 3/14/20189:02 AM

## 2016-09-09 ENCOUNTER — Encounter: Payer: Self-pay | Admitting: Orthopaedic Surgery

## 2016-09-09 ENCOUNTER — Ambulatory Visit (INDEPENDENT_AMBULATORY_CARE_PROVIDER_SITE_OTHER): Payer: Self-pay | Admitting: Orthopaedic Surgery

## 2016-09-09 ENCOUNTER — Ambulatory Visit (INDEPENDENT_AMBULATORY_CARE_PROVIDER_SITE_OTHER): Payer: 59

## 2016-09-09 VITALS — BP 133/74 | HR 76 | Temp 97.7°F | Ht 64.0 in | Wt 145.0 lb

## 2016-09-09 DIAGNOSIS — S72491D Other fracture of lower end of right femur, subsequent encounter for closed fracture with routine healing: Secondary | ICD-10-CM

## 2016-09-09 NOTE — Progress Notes (Signed)
CC:  My knee does not hurt  MRI done on 07-01-16 showed an insufficiency fracture of the distal right medial femoral condyle.  She has been on crutches since then.  She has no pain, no swelling.  NV is intact., ROM of the right knee is full.  X-rays were done, reported separately.  Encounter Diagnosis  Name Primary?  . Other closed fracture of distal end of right femur with routine healing, subsequent encounter Yes   I have told her to start weight bearing as tolerated.  Return in two weeks.  No x-rays unless having problem.  Call if any problem.  Precautions discussed.

## 2016-09-19 ENCOUNTER — Telehealth: Payer: Self-pay | Admitting: Family Medicine

## 2016-09-19 ENCOUNTER — Encounter: Payer: Self-pay | Admitting: Physician Assistant

## 2016-09-19 ENCOUNTER — Ambulatory Visit (INDEPENDENT_AMBULATORY_CARE_PROVIDER_SITE_OTHER): Payer: 59 | Admitting: Physician Assistant

## 2016-09-19 VITALS — BP 112/70 | HR 77 | Temp 98.1°F | Resp 14 | Ht 64.0 in

## 2016-09-19 DIAGNOSIS — R42 Dizziness and giddiness: Secondary | ICD-10-CM

## 2016-09-19 MED ORDER — MECLIZINE HCL 12.5 MG PO TABS: 12.5000 mg | ORAL_TABLET | Freq: Three times a day (TID) | ORAL | 0 refills | Status: DC | PRN

## 2016-09-19 MED ORDER — ONDANSETRON HCL 4 MG/2ML IJ SOLN
4.0000 mg | Freq: Once | INTRAMUSCULAR | Status: AC
  Administered 2016-09-19: 4 mg via INTRAMUSCULAR

## 2016-09-19 MED ORDER — AZELASTINE HCL 0.1 % NA SOLN: 2.0000 | Freq: Two times a day (BID) | NASAL | 0 refills | Status: DC

## 2016-09-19 NOTE — Telephone Encounter (Signed)
Noted  

## 2016-09-19 NOTE — Telephone Encounter (Signed)
SW patient regarding symptoms. Patient reports dizziness that started this morning with the need to hold on to things while walking. Denies visual disturbances or head injury, states she's had a mild headache today from staying in bed all day. Patient advised to go to the Emergency Department or Urgent Care for more detailed assessment that cannot be completed in the clinic, patient refused. Patient states she is not going to the Emergency Room or Urgent Care and will keep her appointment at PCP office for this afternoon.

## 2016-09-19 NOTE — Telephone Encounter (Signed)
Patient Name: Debra Matthews  DOB: Feb 10, 1956    Initial Comment Caller states woke up very dizzy, she has to hold on to something to walk   Nurse Assessment  Nurse: Mallie Mussel, RN, Alveta Heimlich Date/Time Eilene Ghazi Time): 09/19/2016 1:00:49 PM  Confirm and document reason for call. If symptomatic, describe symptoms. ---Caller states that when she woke up this morning, she has had a lot of dizziness. She is having the room spinning sensation. She has to hold onto something to walk. She denies head injury in the last 24 hours.  Does the patient have any new or worsening symptoms? ---Yes  Will a triage be completed? ---Yes  Related visit to physician within the last 2 weeks? ---No  Does the PT have any chronic conditions? (i.e. diabetes, asthma, etc.) ---No  Is this a behavioral health or substance abuse call? ---No     Guidelines    Guideline Title Affirmed Question Affirmed Notes  Dizziness - Vertigo SEVERE dizziness (vertigo) (e.g., unable to walk without assistance)    Final Disposition User   Go to ED Now (or PCP triage) Mallie Mussel, RN, Alveta Heimlich    Comments  I checked [facility] for an appointment and Dr. Birdie Riddle is out until Monday. She stqates that when she called the office, she was told that there was a PA that she could see today. I called the office and was advised that Leeanne Rio PA has been working at their office since November. I was able to schedule an appointment for her at 3:00pm today.   Referrals  REFERRED TO PCP OFFICE   Disagree/Comply: Comply

## 2016-09-19 NOTE — Patient Instructions (Signed)
Please use the Meclizine as directed for dizziness. Use the Astelin spray as directed to help with fluid behind ears and nasal congestion. Stay hydrated and rest.  If symptoms are resolving over the weekend, please follow-up. If anything worsens, or you develop new symptoms, you need to go to the ER. You have promised to do so so we are holding you to it!   Benign Positional Vertigo Vertigo is the feeling that you or your surroundings are moving when they are not. Benign positional vertigo is the most common form of vertigo. The cause of this condition is not serious (is benign). This condition is triggered by certain movements and positions (is positional). This condition can be dangerous if it occurs while you are doing something that could endanger you or others, such as driving. What are the causes? In many cases, the cause of this condition is not known. It may be caused by a disturbance in an area of the inner ear that helps your brain to sense movement and balance. This disturbance can be caused by a viral infection (labyrinthitis), head injury, or repetitive motion. What increases the risk? This condition is more likely to develop in:  Women.  People who are 62 years of age or older. What are the signs or symptoms? Symptoms of this condition usually happen when you move your head or your eyes in different directions. Symptoms may start suddenly, and they usually last for less than a minute. Symptoms may include:  Loss of balance and falling.  Feeling like you are spinning or moving.  Feeling like your surroundings are spinning or moving.  Nausea and vomiting.  Blurred vision.  Dizziness.  Involuntary eye movement (nystagmus). Symptoms can be mild and cause only slight annoyance, or they can be severe and interfere with daily life. Episodes of benign positional vertigo may return (recur) over time, and they may be triggered by certain movements. Symptoms may improve over  time. How is this diagnosed? This condition is usually diagnosed by medical history and a physical exam of the head, neck, and ears. You may be referred to a health care provider who specializes in ear, nose, and throat (ENT) problems (otolaryngologist) or a provider who specializes in disorders of the nervous system (neurologist). You may have additional testing, including:  MRI.  A CT scan.  Eye movement tests. Your health care provider may ask you to change positions quickly while he or she watches you for symptoms of benign positional vertigo, such as nystagmus. Eye movement may be tested with an electronystagmogram (ENG), caloric stimulation, the Dix-Hallpike test, or the roll test.  An electroencephalogram (EEG). This records electrical activity in your brain.  Hearing tests. How is this treated? Usually, your health care provider will treat this by moving your head in specific positions to adjust your inner ear back to normal. Surgery may be needed in severe cases, but this is rare. In some cases, benign positional vertigo may resolve on its own in 2-4 weeks. Follow these instructions at home: Safety   Move slowly.Avoid sudden body or head movements.  Avoid driving.  Avoid operating heavy machinery.  Avoid doing any tasks that would be dangerous to you or others if a vertigo episode would occur.  If you have trouble walking or keeping your balance, try using a cane for stability. If you feel dizzy or unstable, sit down right away.  Return to your normal activities as told by your health care provider. Ask your health care provider what  activities are safe for you. General instructions   Take over-the-counter and prescription medicines only as told by your health care provider.  Avoid certain positions or movements as told by your health care provider.  Drink enough fluid to keep your urine clear or pale yellow.  Keep all follow-up visits as told by your health care  provider. This is important. Contact a health care provider if:  You have a fever.  Your condition gets worse or you develop new symptoms.  Your family or friends notice any behavioral changes.  Your nausea or vomiting gets worse.  You have numbness or a "pins and needles" sensation. Get help right away if:  You have difficulty speaking or moving.  You are always dizzy.  You faint.  You develop severe headaches.  You have weakness in your legs or arms.  You have changes in your hearing or vision.  You develop a stiff neck.  You develop sensitivity to light. This information is not intended to replace advice given to you by your health care provider. Make sure you discuss any questions you have with your health care provider. Document Released: 03/10/2006 Document Revised: 11/08/2015 Document Reviewed: 09/25/2014 Elsevier Interactive Patient Education  2017 [employer].

## 2016-09-19 NOTE — Progress Notes (Signed)
Patient presents to clinic today c/o positional vertigo first noticed this morning when getting out of bed. States room was spinning. Resolved with resting. Has noted any time she turns head or moves to quickly she gets a dizzy spell. Denies chest pain, palpitations, lightheadedness or shortness of breath. Has noted some sinus pressure with nasal congestion. Notes ear ache 2 mornings ago -- L. Lasted 1 day. Associated with sore throat. Notes intermittent sinus headache that she gets with her nasal congestion. Denies fever, vomiting. Endorses nausea. Hydrating well overall.  Past Medical History:  Diagnosis Date  . Abdominal pain, epigastric   . Anemia   . Arthritis   . Chronic constipation    in past  . Cyst in hand    right wrist  . 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 GERD (gastroesophageal reflux disease)   . History of chicken pox   . History of shingles   . RLS (restless legs syndrome)   . Subchondral insufficiency fracture of femoral condyle, right, initial encounter ([place]) 07/15/2016    Current Outpatient Prescriptions on File Prior to Visit  Medication Sig Dispense Refill  . alendronate (FOSAMAX) 70 MG tablet Take 1 tablet (70 mg total) by mouth every 7 (seven) days. Take with a full glass of water on an empty stomach. 4 tablet 11  . atorvastatin (LIPITOR) 20 MG tablet Take 1 tablet (20 mg total) by mouth daily. 30 tablet 6  . Estradiol (VAGIFEM) 10 MCG TABS vaginal tablet Place vaginally. Use 2 times per month    . naproxen (NAPROSYN) 500 MG tablet Take 1 tablet (500 mg total) by mouth 2 (two) times daily with a meal. 60 tablet 0  . omeprazole (PRILOSEC) 40 MG capsule Take 1 capsule (40 mg total) by mouth daily. 90 capsule 3  . pramipexole (MIRAPEX) 1 MG tablet TAKE 1.5 TABLETS (1.5 MG TOTAL) BY MOUTH DAILY. 90 tablet 1  . ranitidine (ZANTAC) 150 MG tablet Take 150 mg by mouth as needed for heartburn. Alternating with Pepcid    . SUMAtriptan  (IMITREX) 100 MG tablet TAKE 1 TABLET BY MOUTH EVERY DAY (DO NOT EXCEED 2 IN 24 HOURS) 30 tablet 3  . tretinoin (RETIN-A) 0.025 % cream APPLY ON THE SKIN AT BEDTIME APPLY TO FACE AT BEDTIME FOR ACNE  3  . valACYclovir (VALTREX) 500 MG tablet     . Wheat Dextrin (BENEFIBER PO) Take by mouth. Take 1 tablespoon 2 to 3 times a  week    . zolpidem (AMBIEN CR) 12.5 MG CR tablet Take 1 tablet (12.5 mg total) by mouth at bedtime as needed for sleep. 30 tablet 0  . acetaminophen (TYLENOL) 325 MG tablet Take 650 mg by mouth as needed.     No current facility-administered medications on file prior to visit.     Allergies  Allergen Reactions  . Pseudoephedrine Other (See Comments)    insomnia  . Prednisone     Pt feels bad, headache, no appetite.    Family History  Problem Relation Age of Onset  . Scoliosis Mother   . Mitral valve prolapse Mother   . Colon cancer Maternal Aunt     Social History   Social History  . Marital status: Married    Spouse name: N/A  . Number of children: 3  . Years of education: N/A   Occupational History  . retired    Social History Main Topics  . Smoking status: Never Smoker  . Smokeless tobacco:  Never Used  . Alcohol use No  . Drug use: No  . Sexual activity: Not Asked   Other Topics Concern  . None   Social History Narrative  . None   Review of Systems - See HPI.  All other ROS are negative.  BP 112/70   Pulse 77   Temp 98.1 F (36.7 C) (Oral)   Resp 14   Ht 5\' 4"  (1.626 m)   SpO2 98%   Physical Exam  Constitutional: She is oriented to person, place, and time and well-developed, well-nourished, and in no distress.  HENT:  Head: Normocephalic and atraumatic.  Right Ear: A middle ear effusion is present.  Left Ear: A middle ear effusion is present.  Nose: Nose normal. Right sinus exhibits no maxillary sinus tenderness and no frontal sinus tenderness. Left sinus exhibits no maxillary sinus tenderness and no frontal sinus tenderness.    Mouth/Throat: Uvula is midline.  + dix hallpike maneuver -- mild left beading nytagmus  Eyes: Conjunctivae and EOM are normal. Pupils are equal, round, and reactive to light.  Neck: Neck supple.  Cardiovascular: Normal rate, regular rhythm, normal heart sounds and intact distal pulses.   Pulmonary/Chest: Effort normal and breath sounds normal. No respiratory distress. She has no wheezes. She has no rales. She exhibits no tenderness.  Lymphadenopathy:    She has no cervical adenopathy.  Neurological: She is alert and oriented to person, place, and time. She has normal reflexes. No cranial nerve deficit. Gait normal.  Skin: Skin is warm and dry. No rash noted.  Psychiatric: Affect normal.  Vitals reviewed.   Recent Results (from the past 2160 hour(s))  Lipid panel     Status: Abnormal   Collection Time: 08/13/16 10:37 AM  Result Value Ref Range   Cholesterol 261 (H) 0 - 200 mg/dL    Comment: ATP III Classification       Desirable:  < 200 mg/dL               Borderline High:  200 - 239 mg/dL          High:  > = 240 mg/dL   Triglycerides 69.0 0.0 - 149.0 mg/dL    Comment: Normal:  <150 mg/dLBorderline High:  150 - 199 mg/dL   HDL 79.80 >39.00 mg/dL   VLDL 13.8 0.0 - 40.0 mg/dL   LDL Cholesterol 167 (H) 0 - 99 mg/dL   Total CHOL/HDL Ratio 3     Comment:                Men          Women1/2 Average Risk     3.4          3.3Average Risk          5.0          4.42X Average Risk          9.6          7.13X Average Risk          15.0          11.0                       NonHDL 180.99     Comment: NOTE:  Non-HDL goal should be 30 mg/dL higher than patient's LDL goal (i.e. LDL goal of < 70 mg/dL, would have non-HDL goal of < 100 mg/dL)  Basic metabolic panel     Status: None  Collection Time: 08/13/16 10:37 AM  Result Value Ref Range   Sodium 139 135 - 145 mEq/L   Potassium 4.2 3.5 - 5.1 mEq/L   Chloride 105 96 - 112 mEq/L   CO2 26 19 - 32 mEq/L   Glucose, Bld 94 70 - 99 mg/dL   BUN 16 6 -  23 mg/dL   Creatinine, Ser 0.66 0.40 - 1.20 mg/dL   Calcium 9.6 8.4 - 10.5 mg/dL   GFR 96.50 >60.00 mL/min  TSH     Status: None   Collection Time: 08/13/16 10:37 AM  Result Value Ref Range   TSH 1.41 0.35 - 4.50 uIU/mL  Hepatic function panel     Status: None   Collection Time: 08/13/16 10:37 AM  Result Value Ref Range   Total Bilirubin 1.2 0.2 - 1.2 mg/dL   Bilirubin, Direct 0.2 0.0 - 0.3 mg/dL   Alkaline Phosphatase 54 39 - 117 U/L   AST 22 0 - 37 U/L   ALT 19 0 - 35 U/L   Total Protein 7.1 6.0 - 8.3 g/dL   Albumin 4.6 3.5 - 5.2 g/dL  VITAMIN D 25 Hydroxy (Vit-D Deficiency, Fractures)     Status: None   Collection Time: 08/13/16 10:37 AM  Result Value Ref Range   VITD 33.35 30.00 - 100.00 ng/mL  CBC with Differential/Platelet     Status: None   Collection Time: 08/13/16 10:37 AM  Result Value Ref Range   WBC 4.6 4.0 - 10.5 K/uL   RBC 4.38 3.87 - 5.11 Mil/uL   Hemoglobin 13.8 12.0 - 15.0 g/dL   HCT 40.3 36.0 - 46.0 %   MCV 92.1 78.0 - 100.0 fl   MCHC 34.2 30.0 - 36.0 g/dL   RDW 12.9 11.5 - 15.5 %   Platelets 177.0 150.0 - 400.0 K/uL   Neutrophils Relative % 55.1 43.0 - 77.0 %   Lymphocytes Relative 35.2 12.0 - 46.0 %   Monocytes Relative 6.6 3.0 - 12.0 %   Eosinophils Relative 2.0 0.0 - 5.0 %   Basophils Relative 1.1 0.0 - 3.0 %   Neutro Abs 2.5 1.4 - 7.7 K/uL   Lymphs Abs 1.6 0.7 - 4.0 K/uL   Monocytes Absolute 0.3 0.1 - 1.0 K/uL   Eosinophils Absolute 0.1 0.0 - 0.7 K/uL   Basophils Absolute 0.1 0.0 - 0.1 K/uL  Vitamin B12     Status: None   Collection Time: 08/14/16  3:46 PM  Result Value Ref Range   Vitamin B-12 328 211 - 911 pg/mL   Assessment/Plan: 1. Vertigo + Dix hallpike. Serous fluid behind TM with L worse than R. Negative OVS. Heart RRR. Neuro examination normal. Vitals stable. IM Zofran given with improvement. Rx Meclizine. Start Meclizine to help with vertigo symptoms. Supportive measures and OTC medications reviewed to help with the fluid. Alarm  signs/symptoms discussed that would prompt ER assessment. She and husbanc voice understanding and agreement with plan.   - ondansetron (ZOFRAN) injection 4 mg; Inject 2 mLs (4 mg total) into the muscle once.   Leeanne Rio, PA-C

## 2016-09-19 NOTE — Progress Notes (Signed)
Pre visit review using our clinic review tool, if applicable. No additional management support is needed unless otherwise documented below in the visit note. 

## 2016-09-23 ENCOUNTER — Ambulatory Visit (INDEPENDENT_AMBULATORY_CARE_PROVIDER_SITE_OTHER): Payer: Self-pay | Admitting: Orthopaedic Surgery

## 2016-09-23 ENCOUNTER — Encounter: Payer: Self-pay | Admitting: Orthopaedic Surgery

## 2016-09-23 DIAGNOSIS — S72491D Other fracture of lower end of right femur, subsequent encounter for closed fracture with routine healing: Secondary | ICD-10-CM

## 2016-09-23 NOTE — Progress Notes (Signed)
CC:  My knee is just a little sore at times  She has much less pain of the right knee.  It has more medial pain at times.  She is walking well with no problem.  She has full ROM of the right knee and no pain.  NV intact.  Encounter Diagnosis  Name Primary?  . Other closed fracture of distal end of right femur with routine healing, subsequent encounter Yes   I will see her as needed.  Call if any problem.  Precautions discussed.  Electronically Signed Sanjuana Kava, MD 4/10/20189:59 AM

## 2016-09-25 ENCOUNTER — Encounter: Payer: Self-pay | Admitting: Orthopaedic Surgery

## 2016-09-25 ENCOUNTER — Telehealth: Payer: Self-pay | Admitting: *Deleted

## 2016-09-25 ENCOUNTER — Telehealth: Payer: Self-pay | Admitting: Orthopaedic Surgery

## 2016-09-25 MED ORDER — NAPROXEN 500 MG PO TABS
500.0000 mg | ORAL_TABLET | Freq: Two times a day (BID) | ORAL | 5 refills | Status: DC
Start: 2016-09-25 — End: 2017-03-09

## 2016-09-25 NOTE — Telephone Encounter (Signed)
Opened in error

## 2016-09-25 NOTE — Telephone Encounter (Signed)
Naprosyn 500 mg  Qty 60 Tablets

## 2016-10-09 ENCOUNTER — Encounter: Payer: Self-pay | Admitting: Family Medicine

## 2016-10-09 ENCOUNTER — Other Ambulatory Visit: Payer: Self-pay | Admitting: General Practice

## 2016-10-09 ENCOUNTER — Encounter: Payer: Self-pay | Admitting: General Practice

## 2016-10-09 MED ORDER — ZOLPIDEM TARTRATE ER 12.5 MG PO TBCR: 12.5000 mg | EXTENDED_RELEASE_TABLET | Freq: Every evening | ORAL | 3 refills | Status: DC | PRN

## 2016-10-09 MED ORDER — VALACYCLOVIR HCL 500 MG PO TABS: 500.0000 mg | ORAL_TABLET | Freq: Two times a day (BID) | ORAL | 1 refills | Status: DC

## 2016-10-09 NOTE — Telephone Encounter (Signed)
Medication filled to pharmacy as requested.   

## 2016-10-09 NOTE — Telephone Encounter (Signed)
Please advise on the valtrex? I changed the pharmacy in the chart

## 2016-10-09 NOTE — Telephone Encounter (Signed)
[facility] for refill on Ambien, #30, 3 refills Ok for Valtrex 500mg  BID x3 days prn, #60, 1 refill

## 2016-10-19 ENCOUNTER — Other Ambulatory Visit: Payer: Self-pay | Admitting: Family Medicine

## 2016-11-17 ENCOUNTER — Other Ambulatory Visit: Payer: Self-pay | Admitting: Obstetrics and Gynecology

## 2016-11-17 DIAGNOSIS — Z01419 Encounter for gynecological examination (general) (routine) without abnormal findings: Secondary | ICD-10-CM | POA: Diagnosis not present

## 2016-11-17 DIAGNOSIS — Z124 Encounter for screening for malignant neoplasm of cervix: Secondary | ICD-10-CM | POA: Diagnosis not present

## 2016-11-17 DIAGNOSIS — Z1231 Encounter for screening mammogram for malignant neoplasm of breast: Secondary | ICD-10-CM | POA: Diagnosis not present

## 2016-11-19 LAB — CYTOLOGY - PAP

## 2016-12-04 ENCOUNTER — Ambulatory Visit (INDEPENDENT_AMBULATORY_CARE_PROVIDER_SITE_OTHER): Payer: 59 | Admitting: Orthopaedic Surgery

## 2016-12-04 ENCOUNTER — Encounter: Payer: Self-pay | Admitting: Orthopaedic Surgery

## 2016-12-04 ENCOUNTER — Ambulatory Visit (INDEPENDENT_AMBULATORY_CARE_PROVIDER_SITE_OTHER): Payer: 59

## 2016-12-04 ENCOUNTER — Ambulatory Visit: Payer: 59

## 2016-12-04 VITALS — BP 132/81 | HR 71 | Temp 97.5°F | Ht 64.0 in

## 2016-12-04 DIAGNOSIS — S72491D Other fracture of lower end of right femur, subsequent encounter for closed fracture with routine healing: Secondary | ICD-10-CM

## 2016-12-04 DIAGNOSIS — M7062 Trochanteric bursitis, left hip: Secondary | ICD-10-CM | POA: Diagnosis not present

## 2016-12-04 NOTE — Progress Notes (Signed)
Patient ER:Debra Matthews, female DOB:30-Jan-1956, 61 y.o. XKG:[id number]  Chief Complaint  Patient presents with  . NEW PROBLEM    Left hip pain    HPI  Debra Matthews is a 61 y.o. female who has a right medial femoral condyle insufficiency fracture by MRI.  The right knee has been hurting more these last few weeks.  She has no swelling, no giving way.  She is shifting her weight and now the lateral left hip hurts. She has no redness. HPI  There is no height or weight on file to calculate BMI.  ROS  Review of Systems  HENT: Negative for congestion.   Respiratory: Negative for cough and shortness of breath.   Cardiovascular: Negative for chest pain and leg swelling.  Endocrine: Positive for cold intolerance.  Musculoskeletal: Positive for arthralgias and gait problem.  Allergic/Immunologic: Positive for environmental allergies.    Past Medical History:  Diagnosis Date  . Abdominal pain, epigastric   . Anemia   . Arthritis   . Chronic constipation    in past  . Cyst in hand    right wrist  . 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 GERD (gastroesophageal reflux disease)   . History of chicken pox   . History of shingles   . RLS (restless legs syndrome)   . Subchondral insufficiency fracture of femoral condyle, right, initial encounter ([place]) 07/15/2016    Past Surgical History:  Procedure Laterality Date  . TUBAL LIGATION      Family History  Problem Relation Age of Onset  . Scoliosis Mother   . Mitral valve prolapse Mother   . Colon cancer Maternal Aunt     Social History Social History  Substance Use Topics  . Smoking status: Never Smoker  . Smokeless tobacco: Never Used  . Alcohol use No    Allergies  Allergen Reactions  . Pseudoephedrine Other (See Comments)    insomnia  . Prednisone     Pt feels bad, headache, no appetite.    Current Outpatient Prescriptions  Medication Sig Dispense Refill  . acetaminophen (TYLENOL) 325 MG  tablet Take 650 mg by mouth as needed.    Marland Kitchen alendronate (FOSAMAX) 70 MG tablet Take 1 tablet (70 mg total) by mouth every 7 (seven) days. Take with a full glass of water on an empty stomach. 4 tablet 11  . atorvastatin (LIPITOR) 20 MG tablet Take 1 tablet (20 mg total) by mouth daily. 30 tablet 6  . azelastine (ASTELIN) 0.1 % nasal spray Place 2 sprays into both nostrils 2 (two) times daily. Use in each nostril as directed 30 mL 0  . Estradiol (VAGIFEM) 10 MCG TABS vaginal tablet Place vaginally. Use 2 times per month    . meclizine (ANTIVERT) 12.5 MG tablet Take 1 tablet (12.5 mg total) by mouth 3 (three) times daily as needed for dizziness. 30 tablet 0  . naproxen (NAPROSYN) 500 MG tablet Take 1 tablet (500 mg total) by mouth 2 (two) times daily with a meal. 60 tablet 5  . omeprazole (PRILOSEC) 40 MG capsule Take 1 capsule (40 mg total) by mouth daily. 90 capsule 3  . pramipexole (MIRAPEX) 1 MG tablet TAKE 1.5 TABLETS (1.5 MG TOTAL) BY MOUTH DAILY. 90 tablet 1  . ranitidine (ZANTAC) 150 MG tablet Take 150 mg by mouth as needed for heartburn. Alternating with Pepcid    . sertraline (ZOLOFT) 100 MG tablet TAKE 1 TABLET (100 MG TOTAL) BY MOUTH DAILY. 30 tablet  3  . SUMAtriptan (IMITREX) 100 MG tablet TAKE 1 TABLET BY MOUTH EVERY DAY (DO NOT EXCEED 2 IN 24 HOURS) 30 tablet 3  . tretinoin (RETIN-A) 0.025 % cream APPLY ON THE SKIN AT BEDTIME APPLY TO FACE AT BEDTIME FOR ACNE  3  . valACYclovir (VALTREX) 500 MG tablet Take 1 tablet (500 mg total) by mouth 2 (two) times daily. 60 tablet 1  . Wheat Dextrin (BENEFIBER PO) Take by mouth. Take 1 tablespoon 2 to 3 times a  week    . zolpidem (AMBIEN CR) 12.5 MG CR tablet Take 1 tablet (12.5 mg total) by mouth at bedtime as needed for sleep. 30 tablet 3   No current facility-administered medications for this visit.      Physical Exam  Blood pressure 132/81, pulse 71, temperature 97.5 F (36.4 C), height 5\' 4"  (1.626 m).  Constitutional: overall normal  hygiene, normal nutrition, well developed, normal grooming, normal body habitus. Assistive device:none  Musculoskeletal: gait and station Limp left, muscle tone and strength are normal, no tremors or atrophy is present.  .  Neurological: coordination overall normal.  Deep tendon reflex/nerve stretch intact.  Sensation normal.  Cranial nerves II-XII intact.   Skin:   Normal overall no scars, lesions, ulcers or rashes. No psoriasis.  Psychiatric: Alert and oriented x 3.  Recent memory intact, remote memory unclear.  Normal mood and affect. Well groomed.  Good eye contact.  Cardiovascular: overall no swelling, no varicosities, no edema bilaterally, normal temperatures of the legs and arms, no clubbing, cyanosis and good capillary refill.  Lymphatic: palpation is normal.  Left hip is very tender over the trochanteric area.  ROM is full.  NV intact.  Limp to the left.  The right knee has no effusion, ROM is full but she complains of medial pain with standing.  The knee is stable.  NV intact.  The patient has been educated about the nature of the problem(s) and counseled on treatment options.  The patient appeared to understand what I have discussed and is in agreement with it.  Encounter Diagnoses  Name Primary?  . Other closed fracture of distal end of right femur with routine healing, subsequent encounter Yes  . Trochanteric bursitis, left hip    X-rays were done of the right knee, reported separately.  PLAN  PROCEDURE NOTE:  The patient requests injections of the right knee , verbal consent was obtained.  The right knee was prepped appropriately after time out was performed.   Sterile technique was observed and injection of 1 cc of Depo-Medrol 40 mg with several cc's of plain xylocaine. Anesthesia was provided by ethyl chloride and a 20-gauge needle was used to inject the knee area. The injection was tolerated well.  A band aid dressing was applied.  The patient was advised to  apply ice later today and tomorrow to the injection sight as needed.  PROCEDURE NOTE:  The patient request injection, verbal consent was obtained.  The left trochanteric area of the hip was prepped appropriately after time out was performed.   Sterile technique was observed and injection of 1 cc of Depo-Medrol 40 mg with several cc's of plain xylocaine. Anesthesia was provided by ethyl chloride and a 20-gauge needle was used to inject the hip area. The injection was tolerated well.  A band aid dressing was applied.  The patient was advised to apply ice later today and tomorrow to the injection sight as needed.  Call if any problems.  Precautions discussed.  Continue current medications.   Return to clinic 1 month   Consider new MRI of the right knee if pain continues.  Electronically Signed Sanjuana Kava, MD 6/21/20189:05 AM

## 2016-12-19 ENCOUNTER — Encounter

## 2016-12-23 ENCOUNTER — Inpatient Hospital Stay: Admit: 2016-12-23 | Payer: BLUE CROSS/BLUE SHIELD | Attending: Pain Medicine | Primary: Internal Medicine

## 2016-12-23 DIAGNOSIS — M4316 Spondylolisthesis, lumbar region: Secondary | ICD-10-CM

## 2017-01-01 ENCOUNTER — Encounter: Payer: Self-pay | Admitting: Orthopaedic Surgery

## 2017-01-01 ENCOUNTER — Ambulatory Visit (INDEPENDENT_AMBULATORY_CARE_PROVIDER_SITE_OTHER): Payer: 59 | Admitting: Orthopaedic Surgery

## 2017-01-01 VITALS — BP 120/78 | HR 74 | Temp 97.6°F | Ht 62.0 in | Wt 138.0 lb

## 2017-01-01 DIAGNOSIS — G8929 Other chronic pain: Secondary | ICD-10-CM | POA: Diagnosis not present

## 2017-01-01 DIAGNOSIS — M25561 Pain in right knee: Secondary | ICD-10-CM

## 2017-01-01 DIAGNOSIS — M84451G Pathological fracture, right femur, subsequent encounter for fracture with delayed healing: Secondary | ICD-10-CM

## 2017-01-01 NOTE — Addendum Note (Signed)
Addended by: Baldomero Lamy B on: 01/01/2017 11:17 AM   Modules accepted: Orders

## 2017-01-01 NOTE — Progress Notes (Signed)
Patient Debra Matthews, female DOB:20-Sep-1955, 61 y.o. EAV:[id number]  Chief Complaint  Patient presents with  . Follow-up    left hip, right knee    HPI  Debra JAGGERS is a 61 y.o. female who has an insufficiency fracture of the right femur.  Her injury was in January.  MRI showed the fracture which was not seen on regular films.  She is having pain now again after she has been doing her normal activities.  She was non weight bearing for 10 weeks.  I am concerned that she is having more pain now, more swelling and not improving.  I would like to get a new MRI to see status and if the original fracture has healed or has delayed healing or if she has a new insufficiency fracture in the same or close area.  HPI  Body mass index is 25.24 kg/m.  ROS  Review of Systems  HENT: Negative for congestion.   Respiratory: Negative for cough and shortness of breath.   Cardiovascular: Negative for chest pain and leg swelling.  Endocrine: Positive for cold intolerance.  Musculoskeletal: Positive for arthralgias and gait problem.  Allergic/Immunologic: Positive for environmental allergies.    Past Medical History:  Diagnosis Date  . Abdominal pain, epigastric   . Anemia   . Arthritis   . Chronic constipation    in past  . Cyst in hand    right wrist  . 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 GERD (gastroesophageal reflux disease)   . History of chicken pox   . History of shingles   . RLS (restless legs syndrome)   . Subchondral insufficiency fracture of femoral condyle, right, initial encounter ([place]) 07/15/2016    Past Surgical History:  Procedure Laterality Date  . TUBAL LIGATION      Family History  Problem Relation Age of Onset  . Scoliosis Mother   . Mitral valve prolapse Mother   . Colon cancer Maternal Aunt     Social History Social History  Substance Use Topics  . Smoking status: Never Smoker  . Smokeless tobacco: Never Used  . Alcohol use No     Allergies  Allergen Reactions  . Pseudoephedrine Other (See Comments)    insomnia  . Prednisone     Pt feels bad, headache, no appetite.    Current Outpatient Prescriptions  Medication Sig Dispense Refill  . acetaminophen (TYLENOL) 325 MG tablet Take 650 mg by mouth as needed.    Marland Kitchen alendronate (FOSAMAX) 70 MG tablet Take 1 tablet (70 mg total) by mouth every 7 (seven) days. Take with a full glass of water on an empty stomach. 4 tablet 11  . atorvastatin (LIPITOR) 20 MG tablet Take 1 tablet (20 mg total) by mouth daily. 30 tablet 6  . azelastine (ASTELIN) 0.1 % nasal spray Place 2 sprays into both nostrils 2 (two) times daily. Use in each nostril as directed 30 mL 0  . Estradiol (VAGIFEM) 10 MCG TABS vaginal tablet Place vaginally. Use 2 times per month    . meclizine (ANTIVERT) 12.5 MG tablet Take 1 tablet (12.5 mg total) by mouth 3 (three) times daily as needed for dizziness. 30 tablet 0  . naproxen (NAPROSYN) 500 MG tablet Take 1 tablet (500 mg total) by mouth 2 (two) times daily with a meal. 60 tablet 5  . omeprazole (PRILOSEC) 40 MG capsule Take 1 capsule (40 mg total) by mouth daily. 90 capsule 3  . pramipexole (MIRAPEX) 1 MG  tablet TAKE 1.5 TABLETS (1.5 MG TOTAL) BY MOUTH DAILY. 90 tablet 1  . ranitidine (ZANTAC) 150 MG tablet Take 150 mg by mouth as needed for heartburn. Alternating with Pepcid    . sertraline (ZOLOFT) 100 MG tablet TAKE 1 TABLET (100 MG TOTAL) BY MOUTH DAILY. 30 tablet 3  . SUMAtriptan (IMITREX) 100 MG tablet TAKE 1 TABLET BY MOUTH EVERY DAY (DO NOT EXCEED 2 IN 24 HOURS) 30 tablet 3  . tretinoin (RETIN-A) 0.025 % cream APPLY ON THE SKIN AT BEDTIME APPLY TO FACE AT BEDTIME FOR ACNE  3  . valACYclovir (VALTREX) 500 MG tablet Take 1 tablet (500 mg total) by mouth 2 (two) times daily. 60 tablet 1  . Wheat Dextrin (BENEFIBER PO) Take by mouth. Take 1 tablespoon 2 to 3 times a  week    . zolpidem (AMBIEN CR) 12.5 MG CR tablet Take 1 tablet (12.5 mg total) by mouth at  bedtime as needed for sleep. 30 tablet 3   No current facility-administered medications for this visit.      Physical Exam  Blood pressure 120/78, pulse 74, temperature 97.6 F (36.4 C), height 5\' 2"  (1.575 m), weight 138 lb (62.6 kg).  Constitutional: overall normal hygiene, normal nutrition, well developed, normal grooming, normal body habitus. Assistive device:none  Musculoskeletal: gait and station Limp right, muscle tone and strength are normal, no tremors or atrophy is present.  .  Neurological: coordination overall normal.  Deep tendon reflex/nerve stretch intact.  Sensation normal.  Cranial nerves II-XII intact.   Skin:   Normal overall no scars, lesions, ulcers or rashes. No psoriasis.  Psychiatric: Alert and oriented x 3.  Recent memory intact, remote memory unclear.  Normal mood and affect. Well groomed.  Good eye contact.  Cardiovascular: overall no swelling, no varicosities, no edema bilaterally, normal temperatures of the legs and arms, no clubbing, cyanosis and good capillary refill.  Lymphatic: palpation is normal.  Her right knee has swelling today, 1+ effusion, diffuse pain, ROM is 0 to 115, limp to the right, the knee is stable.  Muscle tone and strength is normal.  The patient has been educated about the nature of the problem(s) and counseled on treatment options.  The patient appeared to understand what I have discussed and is in agreement with it.  Encounter Diagnoses  Name Primary?  . Subchondral insufficiency fracture of condyle of right femur with delayed healing, subsequent encounter Yes  . Chronic pain of right knee     PLAN Call if any problems.  Precautions discussed.  Continue current medications.   Return to clinic after MRI of the right knee   Electronically Signed Sanjuana Kava, MD 7/19/20189:25 AM

## 2017-01-05 ENCOUNTER — Ambulatory Visit (HOSPITAL_COMMUNITY)
Admission: RE | Admit: 2017-01-05 | Discharge: 2017-01-05 | Disposition: A | Discharge status: Home / Self Care | Payer: 59 | Source: Ambulatory Visit | Attending: Orthopaedic Surgery | Admitting: Orthopaedic Surgery

## 2017-01-05 DIAGNOSIS — M25461 Effusion, right knee: Secondary | ICD-10-CM | POA: Diagnosis not present

## 2017-01-05 DIAGNOSIS — M84451G Pathological fracture, right femur, subsequent encounter for fracture with delayed healing: Secondary | ICD-10-CM | POA: Insufficient documentation

## 2017-01-05 DIAGNOSIS — X58XXXA Exposure to other specified factors, initial encounter: Secondary | ICD-10-CM | POA: Diagnosis not present

## 2017-01-05 DIAGNOSIS — S83241A Other tear of medial meniscus, current injury, right knee, initial encounter: Secondary | ICD-10-CM | POA: Diagnosis not present

## 2017-01-05 DIAGNOSIS — M25561 Pain in right knee: Secondary | ICD-10-CM | POA: Diagnosis not present

## 2017-01-07 ENCOUNTER — Encounter: Payer: Self-pay | Admitting: Orthopaedic Surgery

## 2017-01-07 ENCOUNTER — Ambulatory Visit (INDEPENDENT_AMBULATORY_CARE_PROVIDER_SITE_OTHER): Payer: 59 | Admitting: Orthopaedic Surgery

## 2017-01-07 VITALS — BP 124/76 | HR 65 | Temp 98.2°F | Ht 62.0 in | Wt 140.0 lb

## 2017-01-07 DIAGNOSIS — S83281A Other tear of lateral meniscus, current injury, right knee, initial encounter: Secondary | ICD-10-CM

## 2017-01-07 DIAGNOSIS — S83241A Other tear of medial meniscus, current injury, right knee, initial encounter: Secondary | ICD-10-CM | POA: Diagnosis not present

## 2017-01-07 DIAGNOSIS — M84451D Pathological fracture, right femur, subsequent encounter for fracture with routine healing: Secondary | ICD-10-CM | POA: Diagnosis not present

## 2017-01-07 NOTE — Progress Notes (Signed)
Patient Debra Matthews, female DOB:Oct 25, 1955, 62 y.o. HUD:[id number]  Chief Complaint  Patient presents with  . Follow-up    MRI review Rt knee    HPI  Debra Matthews is a 61 y.o. female who had an insufficieny fracture of the right femur condyle earlier this year. She had been non weight bearing.  She started weight bearing and began to have more pain in the right knee.  I was concerned that the fracture had delayed healing and ordered a new MRI.  She had the second MRI and it shows: IMPRESSION: 1. Radial tear of the posterior horn of the medial meniscus adjacent to the meniscal root with peripheral meniscal extrusion. 2. Small radial tear of the free edge of the posterior horn of the lateral meniscus. Possible horizontal tear of the body of the lateral meniscus extending to the free edge. 3. Tricompartmental cartilage abnormalities as described above. 4. Moderate joint effusion.  The bone has healed.  She has meniscus tears.  I have explained the findings to her.  I have recommended arthroscopy of the knee.  She is agreeable in seeing Dr. Aline Brochure.  HPI  Body mass index is 25.61 kg/m.  ROS  Review of Systems  HENT: Negative for congestion.   Respiratory: Negative for cough and shortness of breath.   Cardiovascular: Negative for chest pain and leg swelling.  Endocrine: Positive for cold intolerance.  Musculoskeletal: Positive for arthralgias and gait problem.  Allergic/Immunologic: Positive for environmental allergies.    Past Medical History:  Diagnosis Date  . Abdominal pain, epigastric   . Anemia   . Arthritis   . Chronic constipation    in past  . Cyst in hand    right wrist  . 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 GERD (gastroesophageal reflux disease)   . History of chicken pox   . History of shingles   . RLS (restless legs syndrome)   . Subchondral insufficiency fracture of femoral condyle, right, initial encounter ([place]) 07/15/2016     Past Surgical History:  Procedure Laterality Date  . TUBAL LIGATION      Family History  Problem Relation Age of Onset  . Scoliosis Mother   . Mitral valve prolapse Mother   . Colon cancer Maternal Aunt     Social History Social History  Substance Use Topics  . Smoking status: Never Smoker  . Smokeless tobacco: Never Used  . Alcohol use No    Allergies  Allergen Reactions  . Pseudoephedrine Other (See Comments)    insomnia  . Prednisone     Pt feels bad, headache, no appetite.    Current Outpatient Prescriptions  Medication Sig Dispense Refill  . acetaminophen (TYLENOL) 325 MG tablet Take 650 mg by mouth as needed.    Marland Kitchen alendronate (FOSAMAX) 70 MG tablet Take 1 tablet (70 mg total) by mouth every 7 (seven) days. Take with a full glass of water on an empty stomach. 4 tablet 11  . atorvastatin (LIPITOR) 20 MG tablet Take 1 tablet (20 mg total) by mouth daily. 30 tablet 6  . azelastine (ASTELIN) 0.1 % nasal spray Place 2 sprays into both nostrils 2 (two) times daily. Use in each nostril as directed 30 mL 0  . Estradiol (VAGIFEM) 10 MCG TABS vaginal tablet Place vaginally. Use 2 times per month    . meclizine (ANTIVERT) 12.5 MG tablet Take 1 tablet (12.5 mg total) by mouth 3 (three) times daily as needed for dizziness. 30 tablet  0  . naproxen (NAPROSYN) 500 MG tablet Take 1 tablet (500 mg total) by mouth 2 (two) times daily with a meal. 60 tablet 5  . omeprazole (PRILOSEC) 40 MG capsule Take 1 capsule (40 mg total) by mouth daily. 90 capsule 3  . pramipexole (MIRAPEX) 1 MG tablet TAKE 1.5 TABLETS (1.5 MG TOTAL) BY MOUTH DAILY. 90 tablet 1  . ranitidine (ZANTAC) 150 MG tablet Take 150 mg by mouth as needed for heartburn. Alternating with Pepcid    . sertraline (ZOLOFT) 100 MG tablet TAKE 1 TABLET (100 MG TOTAL) BY MOUTH DAILY. 30 tablet 3  . SUMAtriptan (IMITREX) 100 MG tablet TAKE 1 TABLET BY MOUTH EVERY DAY (DO NOT EXCEED 2 IN 24 HOURS) 30 tablet 3  . tretinoin (RETIN-A)  0.025 % cream APPLY ON THE SKIN AT BEDTIME APPLY TO FACE AT BEDTIME FOR ACNE  3  . valACYclovir (VALTREX) 500 MG tablet Take 1 tablet (500 mg total) by mouth 2 (two) times daily. 60 tablet 1  . Wheat Dextrin (BENEFIBER PO) Take by mouth. Take 1 tablespoon 2 to 3 times a  week    . zolpidem (AMBIEN CR) 12.5 MG CR tablet Take 1 tablet (12.5 mg total) by mouth at bedtime as needed for sleep. 30 tablet 3   No current facility-administered medications for this visit.      Physical Exam  Blood pressure 124/76, pulse 65, temperature 98.2 F (36.8 C), height 5\' 2"  (1.575 m), weight 140 lb (63.5 kg).  Constitutional: overall normal hygiene, normal nutrition, well developed, normal grooming, normal body habitus. Assistive device:none  Musculoskeletal: gait and station Limp right, muscle tone and strength are normal, no tremors or atrophy is present.  .  Neurological: coordination overall normal.  Deep tendon reflex/nerve stretch intact.  Sensation normal.  Cranial nerves II-XII intact.   Skin:   Normal overall no scars, lesions, ulcers or rashes. No psoriasis.  Psychiatric: Alert and oriented x 3.  Recent memory intact, remote memory unclear.  Normal mood and affect. Well groomed.  Good eye contact.  Cardiovascular: overall no swelling, no varicosities, no edema bilaterally, normal temperatures of the legs and arms, no clubbing, cyanosis and good capillary refill.  Lymphatic: palpation is normal.  The right knee has effusion, 1+.  ROM is full but tender.  She has medial joint pain and positive medial McMurray.  NV intact.  Limp to the right.  The patient has been educated about the nature of the problem(s) and counseled on treatment options.  The patient appeared to understand what I have discussed and is in agreement with it.  Encounter Diagnoses  Name Primary?  . Acute tear medial meniscus, right, initial encounter Yes  . Acute tear lateral meniscus, right, initial encounter   .  Subchondral insufficiency fracture of condyle of right femur with routine healing, subsequent encounter     PLAN Call if any problems.  Precautions discussed.  Continue current medications.   Return to clinic To see Dr. Aline Brochure for possible surgery.   Electronically Signed Sanjuana Kava, MD 7/25/201811:34 AM

## 2017-01-28 ENCOUNTER — Encounter: Payer: Self-pay | Admitting: Orthopedic Surgery

## 2017-01-28 ENCOUNTER — Ambulatory Visit (INDEPENDENT_AMBULATORY_CARE_PROVIDER_SITE_OTHER): Payer: 59 | Admitting: Orthopedic Surgery

## 2017-01-28 VITALS — BP 133/84 | HR 92 | Ht 62.0 in | Wt 138.0 lb

## 2017-01-28 DIAGNOSIS — M23321 Other meniscus derangements, posterior horn of medial meniscus, right knee: Secondary | ICD-10-CM

## 2017-01-28 DIAGNOSIS — M23300 Other meniscus derangements, unspecified lateral meniscus, right knee: Secondary | ICD-10-CM | POA: Diagnosis not present

## 2017-01-28 DIAGNOSIS — M1711 Unilateral primary osteoarthritis, right knee: Secondary | ICD-10-CM

## 2017-01-28 NOTE — Progress Notes (Signed)
Patient ID: Debra Matthews, female   DOB: 06/17/1955, 61 y.o.   MRN: [id number]   Chief Complaint  Patient presents with  . Follow-up    Right knee pain, referred by Dr Luna Glasgow for surgery.    [name] is a 61 y.o. female.   HPI 61 year old female presented to my partner Dr. Luna Glasgow with pain in her right knee starting in January with no history of trauma. However she is very active. She works in her yard she takes care several animals. She complains of medial and lateral knee pain which is worse with weightbearing she's been able to tolerate it for several months but she is now starting to have some lower back pain radiating to her left knee.  She is essentially tired of her knee feeling this way.  She does have some dull aching type of pain consistent with arthritis and then she has some motion deficits consistent with mechanical issue in the knee  She did go for an MRI and essentially she has torn medial and lateral menisci with severe arthritis medial compartment moderate arthritis lateral compartment and mild arthritis patellofemoral compartment  She is opted for arthroscopic treatment versus continued nonoperative care   Review of Systems Review of Systems  Constitutional: Negative for chills, fever and weight loss.  Respiratory: Negative for shortness of breath.   Cardiovascular: Negative for chest pain.  Neurological: Negative for tingling.  All other systems reviewed and are negative.   Past Medical History:  Diagnosis Date  . Abdominal pain, epigastric   . Anemia   . Arthritis   . Chronic constipation    in past  . Cyst in hand    right wrist  . 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 GERD (gastroesophageal reflux disease)   . History of chicken pox   . History of shingles   . RLS (restless legs syndrome)   . Subchondral insufficiency fracture of femoral condyle, right, initial encounter ([place]) 07/15/2016    Past Surgical History:   Procedure Laterality Date  . TUBAL LIGATION      Allergies  Allergen Reactions  . Pseudoephedrine Other (See Comments)    insomnia  . Prednisone     Pt feels bad, headache, no appetite.    Current Outpatient Prescriptions  Medication Sig Dispense Refill  . acetaminophen (TYLENOL) 325 MG tablet Take 650 mg by mouth as needed.    Marland Kitchen alendronate (FOSAMAX) 70 MG tablet Take 1 tablet (70 mg total) by mouth every 7 (seven) days. Take with a full glass of water on an empty stomach. 4 tablet 11  . atorvastatin (LIPITOR) 20 MG tablet Take 1 tablet (20 mg total) by mouth daily. 30 tablet 6  . azelastine (ASTELIN) 0.1 % nasal spray Place 2 sprays into both nostrils 2 (two) times daily. Use in each nostril as directed 30 mL 0  . Estradiol (VAGIFEM) 10 MCG TABS vaginal tablet Place vaginally. Use 2 times per month    . meclizine (ANTIVERT) 12.5 MG tablet Take 1 tablet (12.5 mg total) by mouth 3 (three) times daily as needed for dizziness. 30 tablet 0  . naproxen (NAPROSYN) 500 MG tablet Take 1 tablet (500 mg total) by mouth 2 (two) times daily with a meal. 60 tablet 5  . omeprazole (PRILOSEC) 40 MG capsule Take 1 capsule (40 mg total) by mouth daily. 90 capsule 3  . pramipexole (MIRAPEX) 1 MG tablet TAKE 1.5 TABLETS (1.5 MG TOTAL) BY MOUTH DAILY. [place]  tablet 1  . ranitidine (ZANTAC) 150 MG tablet Take 150 mg by mouth as needed for heartburn. Alternating with Pepcid    . sertraline (ZOLOFT) 100 MG tablet TAKE 1 TABLET (100 MG TOTAL) BY MOUTH DAILY. 30 tablet 3  . SUMAtriptan (IMITREX) 100 MG tablet TAKE 1 TABLET BY MOUTH EVERY DAY (DO NOT EXCEED 2 IN 24 HOURS) 30 tablet 3  . tretinoin (RETIN-A) 0.025 % cream APPLY ON THE SKIN AT BEDTIME APPLY TO FACE AT BEDTIME FOR ACNE  3  . valACYclovir (VALTREX) 500 MG tablet Take 1 tablet (500 mg total) by mouth 2 (two) times daily. 60 tablet 1  . Wheat Dextrin (BENEFIBER PO) Take by mouth. Take 1 tablespoon 2 to 3 times a  week    . zolpidem (AMBIEN CR) 12.5 MG CR  tablet Take 1 tablet (12.5 mg total) by mouth at bedtime as needed for sleep. 30 tablet 3   No current facility-administered medications for this visit.      Physical Exam BP 133/84   Pulse 92   Ht 5\' 2"  (1.575 m)   Wt 138 lb (62.6 kg)   BMI 25.24 kg/m  Physical Exam   The patient is well developed well nourished and well groomed.   Orientation to person place and time is normal   Mood is pleasant. Affect normal  Ambulatory status is remarkable for a limp in the involved extremity         RIGHT  Knee examination:  Inspection: Tenderness is noted over the medial joint line as well as the lateral joint line but I would say the medial side is much more symptomatic  ROM: Is limited by pain with a maximum flexion arc of 115 versus 130 on the left  Stability: Collateral ligaments are stable, the Lachman test and anterior and posterior drawer tests are normal   We do palpate medial joint line tenderness and a positive McMurray's for medial meniscal tear, the lateral McMurray sign is not as sensitive   Motor exam: Grade 5 motor strength in the quadriceps musculature   Skin: Warm dry and intact over the right leg                       Neuro: normal sensation   Vascular: 2+ DP pulse with normal color and no edema.   Currently the left lower extremity and knee examination revealed no tenderness or swelling, full range of motion without contracture subluxation atrophy or tremor. Normal muscle tone no instability and the neurovascular status of the limb is normal.    Assessment and Plan:  IMAGING: I have read and interpret the xrays as follows: Osteoarthritis of the knee  Torn medial meniscus of the right knee with probable tear lateral meniscus    we have discussed a medial and lateral meniscectomy of the right knee. She understands that she will need further surgery in the future as this knee has a high degree of full-thickness cartilage loss on the medial  compartment  She is agreeable to give me 4 weeks to try to get her all the way back to a reasonable degree of activity.   This procedure has been fully reviewed with the patient and written informed consent has been obtained.   Arther Abbott, MD 01/28/2017 5:09 PM

## 2017-02-05 ENCOUNTER — Telehealth: Payer: Self-pay | Admitting: Orthopedic Surgery

## 2017-02-05 NOTE — Telephone Encounter (Signed)
Please call Britany regarding knee surgery that is scheduled for 03/17/17.   Thanks

## 2017-02-10 NOTE — Telephone Encounter (Signed)
Answered patients questions

## 2017-02-10 NOTE — Telephone Encounter (Signed)
Done

## 2017-03-10 NOTE — Patient Instructions (Signed)
Debra Matthews  03/10/2017     @PREFPERIOPPHARMACY @   Your procedure is scheduled on  03/17/2017   Report to [facility] at  845  A.M.  Call this number if you have problems the morning of surgery:  [phone]   Remember:  Do not eat food or drink liquids after midnight.  Take these medicines the morning of surgery with A SIP OF WATER  Mirapex, zanac, imitrex.   Do not wear jewelry, make-up or nail polish.  Do not wear lotions, powders, or perfumes, or deoderant.  Do not shave 48 hours prior to surgery.  Men may shave face and neck.  Do not bring valuables to the hospital.  [facility] is not responsible for any belongings or valuables.  Contacts, dentures or bridgework may not be worn into surgery.  Leave your suitcase in the car.  After surgery it may be brought to your room.  For patients admitted to the hospital, discharge time will be determined by your treatment team.  Patients discharged the day of surgery will not be allowed to drive home.   Name and phone number of your driver:   family Special instructions:  None  Please read over the following fact sheets that you were given. Anesthesia Post-op Instructions and Care and Recovery After Surgery      Knee Ligament Injury, Arthroscopy Arthroscopy is a surgical technique in which your health care provider examines your knee through a small, pencil-sized telescope (arthroscope). Often, repairs to injured ligaments can be done with instruments in the arthroscope. Arthroscopy is less invasive than open-knee surgery. Tell a health care provider about:  Any allergies you have.  All medicines you are taking, including vitamins, herbs, eye drops, creams, and over-the-counter medicines.  Any problems you or family members have had with anesthetic medicines.  Any blood disorders you have.  Any surgeries you have had.  Any medical conditions you have. What are the risks? Generally, this is a safe  procedure. However, as with any procedure, problems can occur. Possible problems include:  Infection.  Bleeding.  Stiffness.  What happens before the procedure?  Ask your health care provider about changing or stopping any regular medicines. Avoid taking aspirin or blood thinners as directed by your health care provider.  Do not eat or drink anything after midnight the night before surgery.  If you smoke, do not smoke for at least 2 weeks before your surgery.  Do not drink alcohol starting the day before your surgery.  Let your health care provider know if you develop a cold or any infection before your surgery.  Arrange for someone to drive you home after the surgery or after your hospital stay. Also arrange for someone to help you with activities during recovery. What happens during the procedure?  Small monitors will be put on your body. They are used to check your heart, blood pressure, and oxygen levels.  An IV access tube will be put into one of your veins. Medicine will be able to flow directly into your body through this IV tube.  You might be given a medicine to help you relax (sedative).  You will be given a medicine that makes you go to sleep (general anesthetic), and a breathing tube will be placed into your lungs during the procedure.  Several small incisions are made in your knee. Saline fluid is placed into one of the incisions to expand the knee and clear  away any blood in the knee.  Your health care provider will insert the arthroscope to examine the injured knee.  During arthroscopy, your health care provider may find a partial or complete tear in a ligament.  Tools can be inserted through the other incisions to repair the injured ligaments.  The incisions are then closed with absorbable stitches and covered with dressings. What happens after the procedure?  You will be taken to the recovery area where you will be monitored.  When you are awake, stable,  and taking fluids without problems, you will be allowed to go home. This information is not intended to replace advice given to you by your health care provider. Make sure you discuss any questions you have with your health care provider. Document Released: 05/30/2000 Document Revised: 11/08/2015 Document Reviewed: 01/12/2013 Elsevier Interactive Patient Education  2017 [place].  Arthroscopic Knee Ligament Repair, Care After This sheet gives you information about how to care for yourself after your procedure. Your health care provider may also give you more specific instructions. If you have problems or questions, contact your health care provider. What can I expect after the procedure? After the procedure, it is common to have:  Pain in your knee.  Bruising and swelling on your knee, calf, and ankle for 3-4 days.  Fatigue.  Follow these instructions at home: If you have a brace or immobilizer:  Wear the brace or immobilizer as told by your health care provider. Remove it only as told by your health care provider.  Loosen the splint or immobilizer if your toes tingle, become numb, or turn cold and blue.  Keep the brace or immobilizer clean. Bathing  Do not take baths, swim, or use a hot tub until your health care provider approves. Ask your health care provider if you can take showers.  Keep your bandage (dressing) dry until your health care provider says that it can be removed. Cover it and your brace or immobilizer with a watertight covering when you take a shower. Incision care  Follow instructions from your health care provider about how to take care of your incision. Make sure you: ? Wash your hands with soap and water before you change your bandage (dressing). If soap and water are not available, use hand sanitizer. ? Change your dressing as told by your health care provider. ? Leave stitches (sutures), skin glue, or adhesive strips in place. These skin closures may need  to stay in place for 2 weeks or longer. If adhesive strip edges start to loosen and curl up, you may trim the loose edges. Do not remove adhesive strips completely unless your health care provider tells you to do that.  Check your incision area every day for signs of infection. Check for: ? More redness, swelling, or pain. ? More fluid or blood. ? Warmth. ? Pus or a bad smell. Managing pain, stiffness, and swelling  If directed, put ice on the affected area. ? If you have a removable brace or immobilizer, remove it as told by your health care provider. ? Put ice in a plastic bag. ? Place a towel between your skin and the bag or between your brace or immobilizer and the bag. ? Leave the ice on for 20 minutes, 2-3 times a day.  Move your toes often to avoid stiffness and to lessen swelling.  Raise (elevate) the injured area above the level of your heart while you are sitting or lying down. Driving  Do not drive until  your health care provider approves. If you have a brace or immobilizer on your leg, ask your health care provider when it is safe for you to drive.  Do not drive or use heavy machinery while taking prescription pain medicine. Activity  Rest as directed. Ask your health care provider what activities are safe for you.  Do physical therapy exercises as told by your health care provider. Physical therapy will help you regain strength and motion in your knee.  Follow instructions from your health care provider about: ? When you may start motion exercises. ? When you may start riding a stationary bike and doing other low-impact activities. ? When you may start to jog and do other high-impact activities. Safety  Do not use the injured limb to support your body weight until your health care provider says that you can. Use crutches as told by your health care provider. General instructions  Do not use any products that contain nicotine or tobacco, such as cigarettes and  e-cigarettes. These can delay bone healing. If you need help quitting, ask your health care provider.  To prevent or treat constipation while you are taking prescription pain medicine, your health care provider may recommend that you: ? Drink enough fluid to keep your urine clear or pale yellow. ? Take over-the-counter or prescription medicines. ? Eat foods that are high in fiber, such as fresh fruits and vegetables, whole grains, and beans. ? Limit foods that are high in fat and processed sugars, such as fried and sweet foods.  Take over-the-counter and prescription medicines only as told by your health care provider.  Keep all follow-up visits as told by your health care provider. This is important. Contact a health care provider if:  You have more redness, swelling, or pain around an incision.  You have more fluid or blood coming from an incision.  Your incision feels warm to the touch.  You have a fever.  You have pain or swelling in your knee, and it gets worse.  You have pain that does not get better with medicine. Get help right away if:  You have trouble breathing.  You have pus or a bad smell coming from an incision.  You have numbness and tingling near the knee joint. Summary  After the procedure, it is common to have knee pain with bruising and swelling on your knee, calf, and ankle.  Icing your knee and raising your leg above the level of your heart will help control the pain and the swelling.  Do physical therapy exercises as told by your health care provider. Physical therapy will help you regain strength and motion in your knee. This information is not intended to replace advice given to you by your health care provider. Make sure you discuss any questions you have with your health care provider. Document Released: 03/23/2013 Document Revised: 05/27/2016 Document Reviewed: 05/27/2016 Elsevier Interactive Patient Education  2017 [place]  Anesthesia, Adult General anesthesia is the use of medicines to make a person "go to sleep" (be unconscious) for a medical procedure. General anesthesia is often recommended when a procedure:  Is long.  Requires you to be still or in an unusual position.  Is major and can cause you to lose blood.  Is impossible to do without general anesthesia.  The medicines used for general anesthesia are called general anesthetics. In addition to making you sleep, the medicines:  Prevent pain.  Control your blood pressure.  Relax your muscles.  Tell  a health care provider about:  Any allergies you have.  All medicines you are taking, including vitamins, herbs, eye drops, creams, and over-the-counter medicines.  Any problems you or family members have had with anesthetic medicines.  Types of anesthetics you have had in the past.  Any bleeding disorders you have.  Any surgeries you have had.  Any medical conditions you have.  Any history of heart or lung conditions, such as heart failure, sleep apnea, or chronic obstructive pulmonary disease (COPD).  Whether you are pregnant or may be pregnant.  Whether you use tobacco, alcohol, marijuana, or street drugs.  Any history of Armed forces logistics/support/administrative officer.  Any history of depression or anxiety. What are the risks? Generally, this is a safe procedure. However, problems may occur, including:  Allergic reaction to anesthetics.  Lung and heart problems.  Inhaling food or liquids from your stomach into your lungs (aspiration).  Injury to nerves.  Waking up during your procedure and being unable to move (rare).  Extreme agitation or a state of mental confusion (delirium) when you wake up from the anesthetic.  Air in the bloodstream, which can lead to stroke.  These problems are more likely to develop if you are having a major surgery or if you have an advanced medical condition. You can prevent some of these complications by answering all of  your health care provider's questions thoroughly and by following all pre-procedure instructions. General anesthesia can cause side effects, including:  Nausea or vomiting  A sore throat from the breathing tube.  Feeling cold or shivery.  Feeling tired, washed out, or achy.  Sleepiness or drowsiness.  Confusion or agitation.  What happens before the procedure? Staying hydrated Follow instructions from your health care provider about hydration, which may include:  Up to 2 hours before the procedure - you may continue to drink clear liquids, such as water, clear fruit juice, black coffee, and plain tea.  Eating and drinking restrictions Follow instructions from your health care provider about eating and drinking, which may include:  8 hours before the procedure - stop eating heavy meals or foods such as meat, fried foods, or fatty foods.  6 hours before the procedure - stop eating light meals or foods, such as toast or cereal.  6 hours before the procedure - stop drinking milk or drinks that contain milk.  2 hours before the procedure - stop drinking clear liquids.  Medicines  Ask your health care provider about: ? Changing or stopping your regular medicines. This is especially important if you are taking diabetes medicines or blood thinners. ? Taking medicines such as aspirin and ibuprofen. These medicines can thin your blood. Do not take these medicines before your procedure if your health care provider instructs you not to. ? Taking new dietary supplements or medicines. Do not take these during the week before your procedure unless your health care provider approves them.  If you are told to take a medicine or to continue taking a medicine on the day of the procedure, take the medicine with sips of water. General instructions   Ask if you will be going home the same day, the following day, or after a longer hospital stay. ? Plan to have someone take you home. ? Plan to  have someone stay with you for the first 24 hours after you leave the hospital or clinic.  For 3-6 weeks before the procedure, try not to use any tobacco products, such as cigarettes, chewing tobacco, and e-cigarettes.  You may brush your teeth on the morning of the procedure, but make sure to spit out the toothpaste. What happens during the procedure?  You will be given anesthetics through a mask and through an IV tube in one of your veins.  You may receive medicine to help you relax (sedative).  As soon as you are asleep, a breathing tube may be used to help you breathe.  An anesthesia specialist will stay with you throughout the procedure. He or she will help keep you comfortable and safe by continuing to give you medicines and adjusting the amount of medicine that you get. He or she will also watch your blood pressure, pulse, and oxygen levels to make sure that the anesthetics do not cause any problems.  If a breathing tube was used to help you breathe, it will be removed before you wake up. The procedure may vary among health care providers and hospitals. What happens after the procedure?  You will wake up, often slowly, after the procedure is complete, usually in a recovery area.  Your blood pressure, heart rate, breathing rate, and blood oxygen level will be monitored until the medicines you were given have worn off.  You may be given medicine to help you calm down if you feel anxious or agitated.  If you will be going home the same day, your health care provider may check to make sure you can stand, drink, and urinate.  Your health care providers will treat your pain and side effects before you go home.  Do not drive for 24 hours if you received a sedative.  You may: ? Feel nauseous and vomit. ? Have a sore throat. ? Have mental slowness. ? Feel cold or shivery. ? Feel sleepy. ? Feel tired. ? Feel sore or achy, even in parts of your body where you did not have  surgery. This information is not intended to replace advice given to you by your health care provider. Make sure you discuss any questions you have with your health care provider. Document Released: 09/09/2007 Document Revised: 11/13/2015 Document Reviewed: 05/17/2015 Elsevier Interactive Patient Education  2018 [place] Anesthesia, Adult, Care After These instructions provide you with information about caring for yourself after your procedure. Your health care provider may also give you more specific instructions. Your treatment has been planned according to current medical practices, but problems sometimes occur. Call your health care provider if you have any problems or questions after your procedure. What can I expect after the procedure? After the procedure, it is common to have:  Vomiting.  A sore throat.  Mental slowness.  It is common to feel:  Nauseous.  Cold or shivery.  Sleepy.  Tired.  Sore or achy, even in parts of your body where you did not have surgery.  Follow these instructions at home: For at least 24 hours after the procedure:  Do not: ? Participate in activities where you could fall or become injured. ? Drive. ? Use heavy machinery. ? Drink alcohol. ? Take sleeping pills or medicines that cause drowsiness. ? Make important decisions or sign legal documents. ? Take care of children on your own.  Rest. Eating and drinking  If you vomit, drink water, juice, or soup when you can drink without vomiting.  Drink enough fluid to keep your urine clear or pale yellow.  Make sure you have little or no nausea before eating solid foods.  Follow the diet recommended by your health care provider. General instructions  Have a responsible adult stay with you until you are awake and alert.  Return to your normal activities as told by your health care provider. Ask your health care provider what activities are safe for you.  Take over-the-counter  and prescription medicines only as told by your health care provider.  If you smoke, do not smoke without supervision.  Keep all follow-up visits as told by your health care provider. This is important. Contact a health care provider if:  You continue to have nausea or vomiting at home, and medicines are not helpful.  You cannot drink fluids or start eating again.  You cannot urinate after 8-12 hours.  You develop a skin rash.  You have fever.  You have increasing redness at the site of your procedure. Get help right away if:  You have difficulty breathing.  You have chest pain.  You have unexpected bleeding.  You feel that you are having a life-threatening or urgent problem. This information is not intended to replace advice given to you by your health care provider. Make sure you discuss any questions you have with your health care provider. Document Released: 09/08/2000 Document Revised: 11/05/2015 Document Reviewed: 05/17/2015 Elsevier Interactive Patient Education  [employer].

## 2017-03-11 ENCOUNTER — Encounter: Payer: Self-pay | Admitting: *Deleted

## 2017-03-11 ENCOUNTER — Encounter (HOSPITAL_COMMUNITY)
Admission: RE | Admit: 2017-03-11 | Discharge: 2017-03-11 | Disposition: A | Discharge status: Home / Self Care | Payer: 59 | Source: Ambulatory Visit | Attending: Orthopedic Surgery | Admitting: Orthopedic Surgery

## 2017-03-11 NOTE — Progress Notes (Signed)
SARK with [facility] and LM CPT 29881 was denied. Dr. Aline Brochure spoke with Dr. Onnie Graham with [facility] on 03/11/17, and he stated that the patient has advanced arthritis and he do not feel she will get relief from surgery. The surgery was cancelled for 03/17/17. Patient notified and appointment made with Dr. Luna Glasgow.

## 2017-03-12 ENCOUNTER — Telehealth: Payer: Self-pay | Admitting: *Deleted

## 2017-03-12 NOTE — Telephone Encounter (Signed)
Debra Matthews called from [facility] stating they had a  Peer to peer and the request is still denied.

## 2017-03-13 ENCOUNTER — Other Ambulatory Visit (HOSPITAL_COMMUNITY): Payer: 59

## 2017-03-13 ENCOUNTER — Encounter (HOSPITAL_COMMUNITY): Payer: Self-pay

## 2017-03-13 NOTE — Telephone Encounter (Signed)
Noted  

## 2017-03-15 ENCOUNTER — Other Ambulatory Visit: Payer: Self-pay | Admitting: Family Medicine

## 2017-03-17 ENCOUNTER — Encounter (HOSPITAL_COMMUNITY): Admission: RE | Payer: Self-pay | Source: Ambulatory Visit

## 2017-03-17 ENCOUNTER — Ambulatory Visit (HOSPITAL_COMMUNITY): Admission: RE | Admit: 2017-03-17 | Payer: 59 | Source: Ambulatory Visit | Admitting: Orthopedic Surgery

## 2017-03-17 SURGERY — ARTHROSCOPY, KNEE, WITH MEDIAL MENISCECTOMY
Anesthesia: General | Laterality: Right

## 2017-03-18 ENCOUNTER — Encounter: Payer: Self-pay | Admitting: Orthopaedic Surgery

## 2017-03-18 ENCOUNTER — Ambulatory Visit (INDEPENDENT_AMBULATORY_CARE_PROVIDER_SITE_OTHER): Payer: 59 | Admitting: Orthopaedic Surgery

## 2017-03-18 VITALS — BP 128/77 | HR 73 | Temp 97.8°F | Ht 62.0 in | Wt 137.0 lb

## 2017-03-18 DIAGNOSIS — M23300 Other meniscus derangements, unspecified lateral meniscus, right knee: Secondary | ICD-10-CM | POA: Diagnosis not present

## 2017-03-18 DIAGNOSIS — M23321 Other meniscus derangements, posterior horn of medial meniscus, right knee: Secondary | ICD-10-CM

## 2017-03-18 DIAGNOSIS — M1711 Unilateral primary osteoarthritis, right knee: Secondary | ICD-10-CM | POA: Diagnosis not present

## 2017-03-18 NOTE — Progress Notes (Signed)
Patient BO:FBPZ SHALEY Matthews, female DOB:29-Nov-1955, 60 y.o. WCH:[id number]  Chief Complaint  Patient presents with  . Follow-up    Right knee    HPI  Debra Matthews is a 61 y.o. female who has right knee pain and tear of medial and lateral menisci.  She was to have surgery on the right knee but it was denied by her insurance company saying it was not necessary as she has significant DJD of her knee.  She does not want a total knee.  She has pain of the right knee at rest and with activity.  She has instability of the right knee.  She has giving way.  She has crepitus and swelling.  She is unable to do activities of daily living.  She has a limp on the right causing the left hip to hurt.  She has no new trauma, no redness.  She is very upset that she cannot have surgery of the knee because of her insurance companies actions.  I told her to appeal to the insurance company.  She declines an injection.  She said it did not help and made her knee hurt more.  I told her about possible physical therapy.  She wants to contact her insurance company first. HPI  Body mass index is 25.06 kg/m.  ROS  Review of Systems  HENT: Negative for congestion.   Respiratory: Negative for cough and shortness of breath.   Cardiovascular: Negative for chest pain and leg swelling.  Endocrine: Positive for cold intolerance.  Musculoskeletal: Positive for arthralgias, gait problem and joint swelling.  Allergic/Immunologic: Positive for environmental allergies.    Past Medical History:  Diagnosis Date  . Abdominal pain, epigastric   . Anemia   . Arthritis   . Chronic constipation    in past  . Cyst in hand    right wrist  . 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 GERD (gastroesophageal reflux disease)   . History of chicken pox   . History of shingles   . RLS (restless legs syndrome)   . Subchondral insufficiency fracture of femoral condyle, right, initial encounter ([place])  07/15/2016    Past Surgical History:  Procedure Laterality Date  . TUBAL LIGATION      Family History  Problem Relation Age of Onset  . Scoliosis Mother   . Mitral valve prolapse Mother   . Colon cancer Maternal Aunt     Social History Social History  Substance Use Topics  . Smoking status: Never Smoker  . Smokeless tobacco: Never Used  . Alcohol use No    Allergies  Allergen Reactions  . Pseudoephedrine Other (See Comments)    Insomnia  . Prednisone     Pt feels bad, headache, no appetite.    Current Outpatient Prescriptions  Medication Sig Dispense Refill  . acetaminophen (TYLENOL) 500 MG tablet Take 1,000 mg by mouth every 6 (six) hours as needed for mild pain or headache.    . alendronate (FOSAMAX) 70 MG tablet Take 1 tablet (70 mg total) by mouth every 7 (seven) days. Take with a full glass of water on an empty stomach. (Patient not taking: Reported on 03/09/2017) 4 tablet 11  . aspirin-acetaminophen-caffeine (EXCEDRIN MIGRAINE) 250-250-65 MG tablet Take 2 tablets by mouth daily as needed for headache.    Marland Kitchen atorvastatin (LIPITOR) 20 MG tablet Take 1 tablet (20 mg total) by mouth daily. (Patient not taking: Reported on 03/09/2017) 30 tablet 6  . azelastine (ASTELIN) 0.1 %  nasal spray Place 2 sprays into both nostrils 2 (two) times daily. Use in each nostril as directed (Patient not taking: Reported on 03/09/2017) 30 mL 0  . clobetasol ointment (TEMOVATE) 5.80 % Apply 1 application topically 2 (two) times daily as needed for rash.  2  . Estradiol (VAGIFEM) 10 MCG TABS vaginal tablet Place 10 mcg vaginally See admin instructions. Use 2 times per month    . omeprazole (PRILOSEC) 40 MG capsule TAKE 1 CAPSULE (40 MG TOTAL) BY MOUTH DAILY. 90 capsule 0  . pramipexole (MIRAPEX) 1 MG tablet TAKE 1.5 TABLETS (1.5 MG TOTAL) BY MOUTH DAILY. 90 tablet 1  . ranitidine (ZANTAC) 150 MG tablet Take 150 mg by mouth daily.     . sertraline (ZOLOFT) 100 MG tablet TAKE 1 TABLET (100 MG TOTAL)  BY MOUTH DAILY. (Patient not taking: Reported on 03/09/2017) 30 tablet 3  . SUMAtriptan (IMITREX) 100 MG tablet TAKE 1 TABLET BY MOUTH EVERY DAY (DO NOT EXCEED 2 IN 24 HOURS) (Patient taking differently: TAKE 1 TABLET BY MOUTH ONCE A DAY AS NEEDED FOR MIGRAINE HEADACHE. (DO NOT EXCEED 2 IN 24 HOURS)) 30 tablet 3  . tretinoin (RETIN-A) 0.025 % cream APPLY ON THE SKIN AT BEDTIME APPLY TO FACE AT BEDTIME AS NEEDED FOR ACNE  3  . valACYclovir (VALTREX) 500 MG tablet Take 1 tablet (500 mg total) by mouth 2 (two) times daily. (Patient taking differently: Take 500 mg by mouth 2 (two) times daily as needed (cold sores). ) 60 tablet 1  . Wheat Dextrin (BENEFIBER PO) Take 1 scoop by mouth 2 (two) times daily as needed (constipation).     Marland Kitchen zolpidem (AMBIEN CR) 12.5 MG CR tablet Take 1 tablet (12.5 mg total) by mouth at bedtime as needed for sleep. 30 tablet 3   No current facility-administered medications for this visit.      Physical Exam  Blood pressure 128/77, pulse 73, temperature 97.8 F (36.6 C), height 5\' 2"  (1.575 m), weight 137 lb (62.1 kg).  Constitutional: overall normal hygiene, normal nutrition, well developed, normal grooming, normal body habitus. Assistive device:none  Musculoskeletal: gait and station Limp right, muscle tone and strength are normal, no tremors or atrophy is present.  .  Neurological: coordination overall normal.  Deep tendon reflex/nerve stretch intact.  Sensation normal.  Cranial nerves II-XII intact.   Skin:   Normal overall no scars, lesions, ulcers or rashes. No psoriasis.  Psychiatric: Alert and oriented x 3.  Recent memory intact, remote memory unclear.  Normal mood and affect. Well groomed.  Good eye contact.  Cardiovascular: overall no swelling, no varicosities, no edema bilaterally, normal temperatures of the legs and arms, no clubbing, cyanosis and good capillary refill.  Lymphatic: palpation is normal.  All other systems reviewed and are negative    Right knee has effusion, pain, crepitus.  ROM of 0 to 105.  Positive medial [place].  Drawer sign negative.  Limp right.  The patient has been educated about the nature of the problem(s) and counseled on treatment options.  The patient appeared to understand what I have discussed and is in agreement with it.  Encounter Diagnoses  Name Primary?  . Derangement of posterior horn of medial meniscus of right knee Yes  . Primary osteoarthritis of right knee   . Meniscus, lateral, derangement, right     PLAN Call if any problems.  Precautions discussed.  Continue current medications.   Return to clinic 1 month   To talk to insurance company.  Electronically Signed Sanjuana Kava, MD 10/3/201810:53 AM

## 2017-03-19 ENCOUNTER — Ambulatory Visit: Payer: 59 | Admitting: Orthopaedic Surgery

## 2017-03-20 ENCOUNTER — Ambulatory Visit: Payer: 59 | Admitting: Orthopedic Surgery

## 2017-03-20 NOTE — Telephone Encounter (Signed)
Nothing to add  They denied it I called him he said no

## 2017-03-23 NOTE — Telephone Encounter (Signed)
Dr Luna Glasgow,   Patient has called to follow up on this MyChart message/Email - states that you may also be contacting her insurer, [employer], per her office visit with you 03/18/17. Please see notes/messages.  (Patient has filed an appeal with her insurer, in regard to the denial of her surgery)

## 2017-04-09 DIAGNOSIS — G8929 Other chronic pain: Secondary | ICD-10-CM | POA: Diagnosis not present

## 2017-04-09 DIAGNOSIS — M25561 Pain in right knee: Secondary | ICD-10-CM | POA: Diagnosis not present

## 2017-04-15 DIAGNOSIS — M1711 Unilateral primary osteoarthritis, right knee: Secondary | ICD-10-CM | POA: Diagnosis not present

## 2017-04-15 DIAGNOSIS — G8929 Other chronic pain: Secondary | ICD-10-CM | POA: Diagnosis not present

## 2017-04-15 DIAGNOSIS — M25561 Pain in right knee: Secondary | ICD-10-CM | POA: Diagnosis not present

## 2017-04-24 DIAGNOSIS — L570 Actinic keratosis: Secondary | ICD-10-CM | POA: Diagnosis not present

## 2017-04-24 DIAGNOSIS — L821 Other seborrheic keratosis: Secondary | ICD-10-CM | POA: Diagnosis not present

## 2017-04-24 DIAGNOSIS — Z85828 Personal history of other malignant neoplasm of skin: Secondary | ICD-10-CM | POA: Diagnosis not present

## 2017-04-29 IMAGING — MR MR KNEE*R* W/O CM
4 of 7 series · 13 of 40 positions shown · non-contrast
Comparison: 07/09/2016 radiographs

CLINICAL DATA: Persistent increasing right knee pain mostly
posteromedially for the past several months without known injury.

EXAM:
MRI OF THE RIGHT KNEE WITHOUT CONTRAST
TECHNIQUE: Multiplanar, multisequence MR imaging of the knee was performed. No
intravenous contrast was administered.

[Series 3: pdfs axial · axial · 3.0mm · 0.22mm/px · z∈[-57,+15]mm · 3 of 30 slices shown]
[im 5/30]
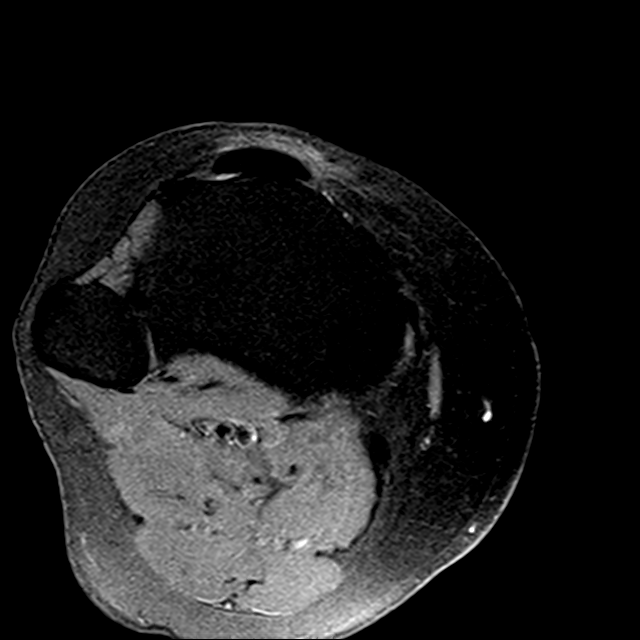
[im 15/30]
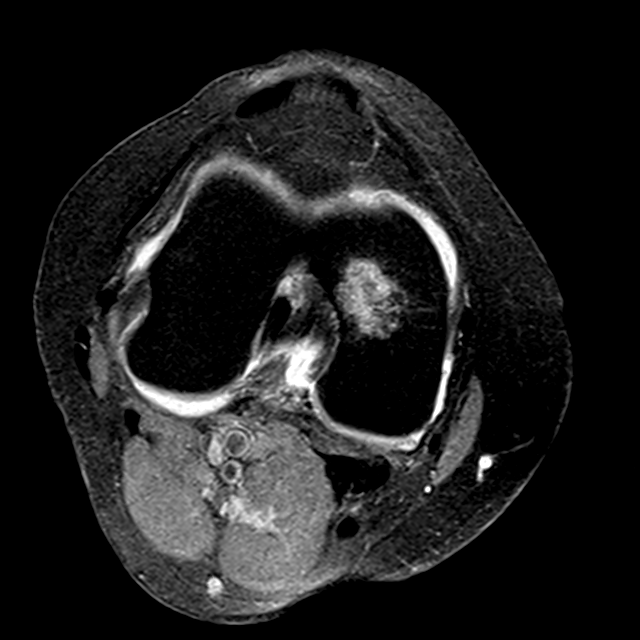
[im 25/30]
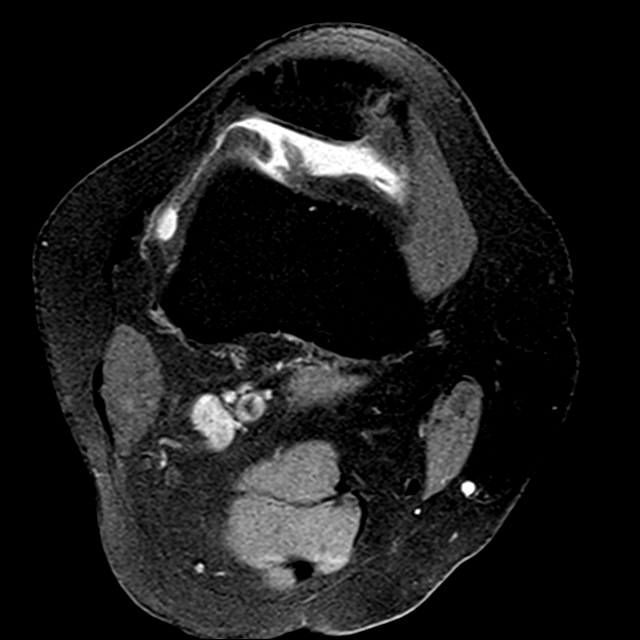

[Series 4: T1 · coronal · 3.0mm · 0.17mm/px · 4 of 26 slices shown]
[im 1/26]
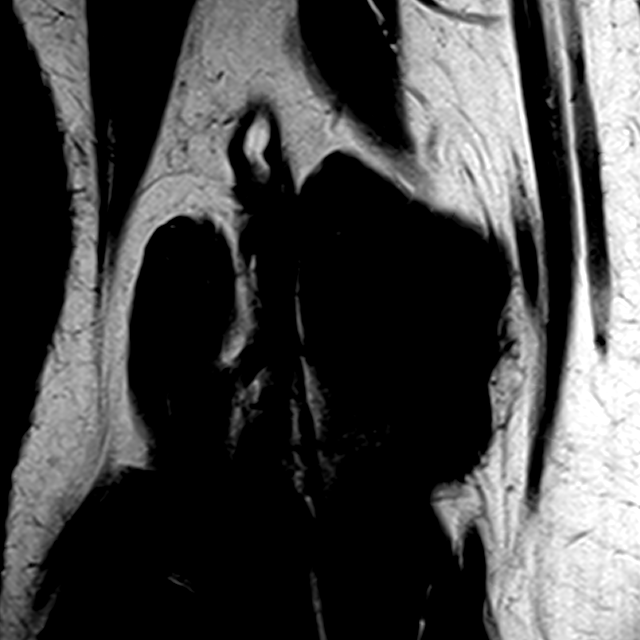
[im 6/26]
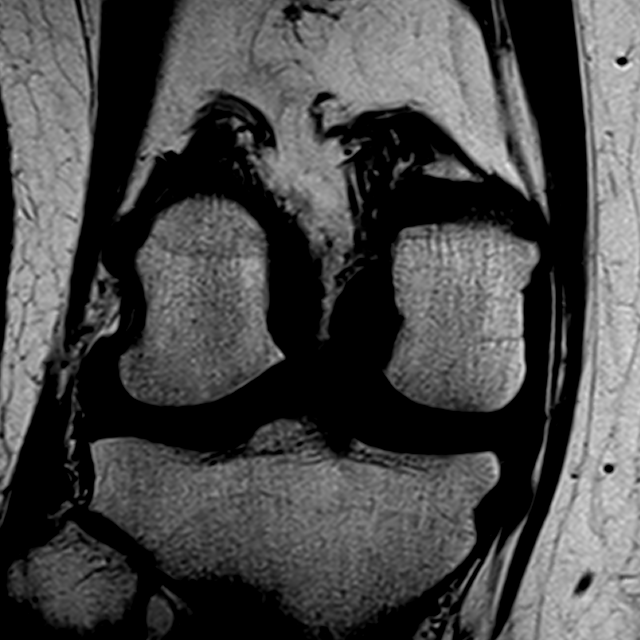
[im 16/26]
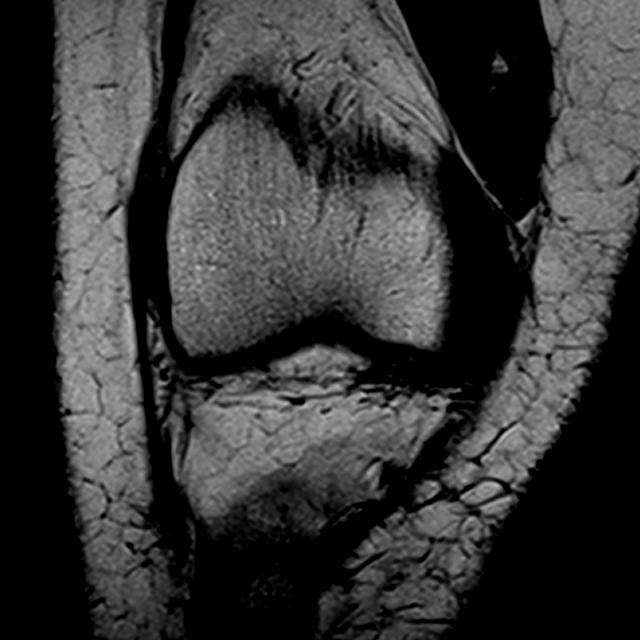
[im 26/26]
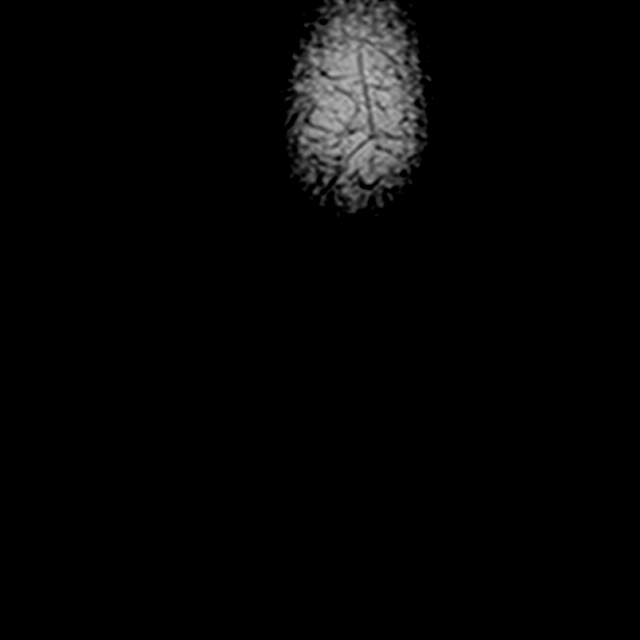

[Series 5: pdfs sag · sagittal · 3.0mm · 0.18mm/px · 3 of 29 slices shown]
[im 6/29]
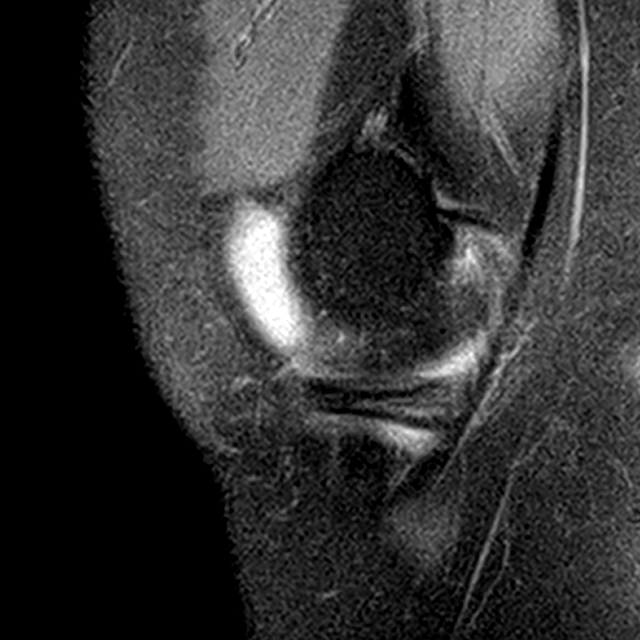
[im 17/29]
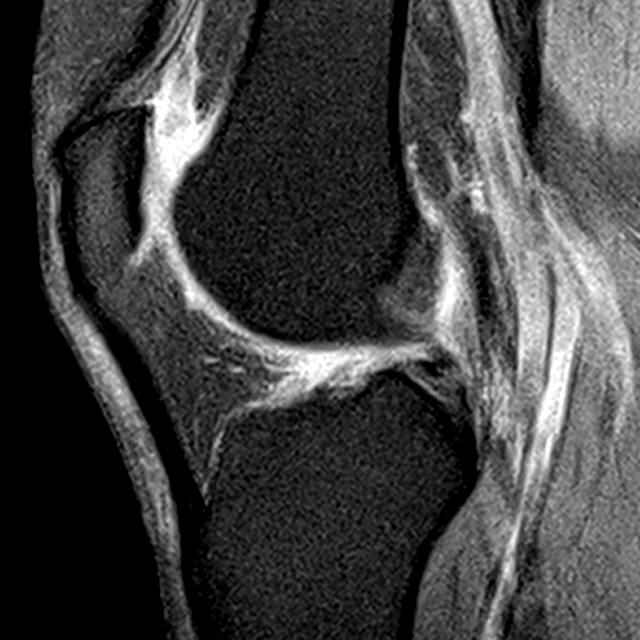
[im 29/29]
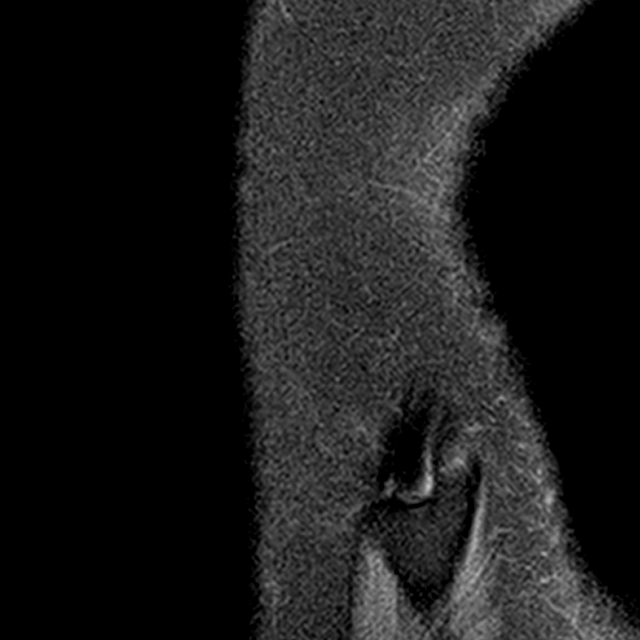

[Series 6: t2fs cor · coronal · 3.0mm · 0.18mm/px · 3 of 26 slices shown]
[im 6/26]
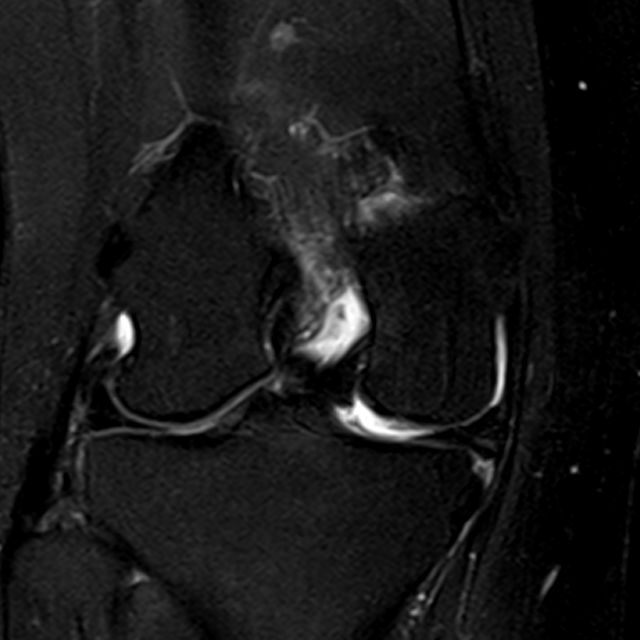
[im 16/26]
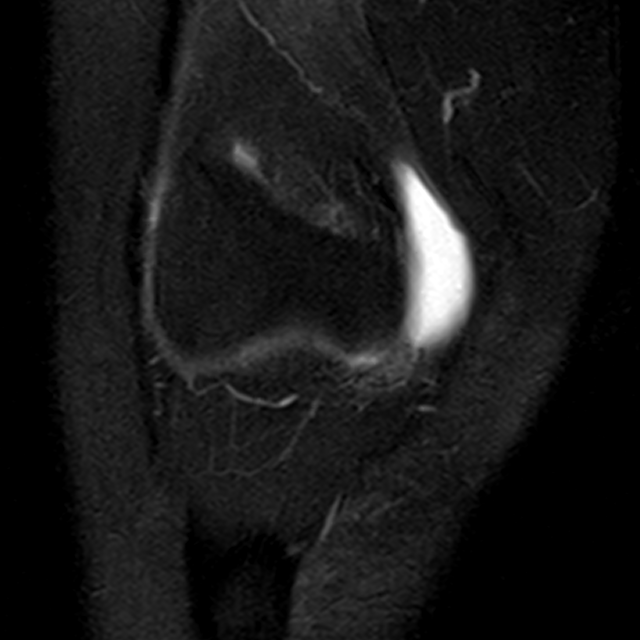
[im 26/26]
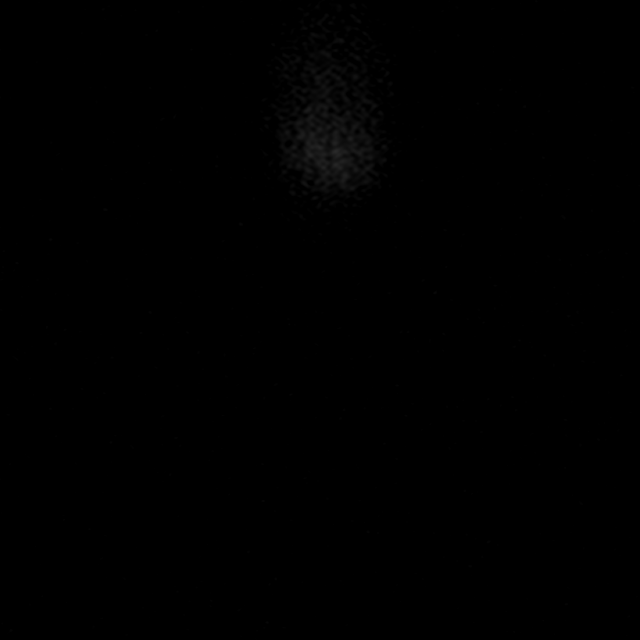

[13 of 40 positions shown; findings below may reference images not displayed]

FINDINGS: MENISCI

Medial meniscus: Mucoid degeneration without articular surfacing
tear.

Lateral meniscus: Mucoid degeneration without articular surfacing
tear.

LIGAMENTS

Cruciates:  Intact

Collaterals:  Intact

CARTILAGE

Patellofemoral: Mild blistering of the lateral patellar cartilage.
Minimal partial thinning of the medial patellar cartilage. No focal
chondral defect.

Medial: Mild partial-thickness cartilage loss of the medial
femorotibial compartment.

Lateral:  No focal chondral defect.

Joint:  Small suprapatellar joint effusion.  No plica.

Popliteal Fossa:  No Baker cyst. Intact popliteus tendon.

Extensor Mechanism:  Intact quadriceps tendon and patellar tendon.

Bones: Focal zone of bone marrow edema of the weight-bearing portion
of the medial femoral condyle measuring 2 x 1.5 x 1.6 cm in
craniocaudad by transverse by AP dimension with slight flattening of
the overlying femoral cortex, series 5 image 11 consistent with an
insufficiency fracture.

Other: None
IMPRESSION: Femoral insufficiency fracture involving the weight-bearing portion
of the medial femoral condyle with slight flattening of the
overlying femoral cortex. The tibial plateau opposite this area is
unremarkable.

Mucoid degeneration of the menisci without surfacing tear. Intact
ACL, PCL and collateral ligaments.

## 2017-05-06 ENCOUNTER — Ambulatory Visit (INDEPENDENT_AMBULATORY_CARE_PROVIDER_SITE_OTHER): Payer: 59 | Admitting: Family Medicine

## 2017-05-06 ENCOUNTER — Other Ambulatory Visit: Payer: Self-pay

## 2017-05-06 ENCOUNTER — Encounter: Payer: Self-pay | Admitting: Family Medicine

## 2017-05-06 VITALS — BP 121/82 | HR 89 | Temp 98.2°F | Resp 17 | Ht 62.0 in | Wt 137.5 lb

## 2017-05-06 DIAGNOSIS — E785 Hyperlipidemia, unspecified: Secondary | ICD-10-CM | POA: Diagnosis not present

## 2017-05-06 DIAGNOSIS — Z01818 Encounter for other preprocedural examination: Secondary | ICD-10-CM | POA: Diagnosis not present

## 2017-05-06 LAB — CBC WITH DIFFERENTIAL/PLATELET
BASOS ABS: 0 10*3/uL (ref 0.0–0.1)
Basophils Relative: 0.7 % (ref 0.0–3.0)
EOS PCT: 0.5 % (ref 0.0–5.0)
Eosinophils Absolute: 0 10*3/uL (ref 0.0–0.7)
HCT: 43.2 % (ref 36.0–46.0)
Hemoglobin: 14.4 g/dL (ref 12.0–15.0)
LYMPHS ABS: 1.5 10*3/uL (ref 0.7–4.0)
Lymphocytes Relative: 24.3 % (ref 12.0–46.0)
MCHC: 33.3 g/dL (ref 30.0–36.0)
MCV: 94.5 fl (ref 78.0–100.0)
MONO ABS: 0.4 10*3/uL (ref 0.1–1.0)
MONOS PCT: 5.8 % (ref 3.0–12.0)
NEUTROS PCT: 68.7 % (ref 43.0–77.0)
Neutro Abs: 4.1 10*3/uL (ref 1.4–7.7)
PLATELETS: 196 10*3/uL (ref 150.0–400.0)
RBC: 4.57 Mil/uL (ref 3.87–5.11)
RDW: 12.6 % (ref 11.5–15.5)
WBC: 6 10*3/uL (ref 4.0–10.5)

## 2017-05-06 LAB — BASIC METABOLIC PANEL
BUN: 14 mg/dL (ref 6–23)
CO2: 27 mEq/L (ref 19–32)
CREATININE: 0.56 mg/dL (ref 0.40–1.20)
Calcium: 9.8 mg/dL (ref 8.4–10.5)
Chloride: 104 mEq/L (ref 96–112)
GFR: 116.37 mL/min (ref >60.00)
GLUCOSE: 84 mg/dL (ref 70–99)
Potassium: 4.1 mEq/L (ref 3.5–5.1)
Sodium: 140 mEq/L (ref 135–145)

## 2017-05-06 LAB — HEPATIC FUNCTION PANEL
ALBUMIN: 4.8 g/dL (ref 3.5–5.2)
ALT: 16 U/L (ref 0–35)
AST: 21 U/L (ref 0–37)
Alkaline Phosphatase: 52 U/L (ref 39–117)
Bilirubin, Direct: 0.2 mg/dL (ref 0.0–0.3)
TOTAL PROTEIN: 7 g/dL (ref 6.0–8.3)
Total Bilirubin: 1.3 mg/dL — ABNORMAL HIGH (ref 0.2–1.2)

## 2017-05-06 LAB — LIPID PANEL
CHOL/HDL RATIO: 3
CHOLESTEROL: 241 mg/dL — AB (ref 0–200)
HDL: 78.2 mg/dL (ref >39.00)
LDL CALC: 146 mg/dL — AB (ref 0–99)
NonHDL: 162.75
TRIGLYCERIDES: 86 mg/dL (ref 0.0–149.0)
VLDL: 17.2 mg/dL (ref 0.0–40.0)

## 2017-05-06 LAB — TSH: TSH: 3.11 u[IU]/mL (ref 0.35–4.50)

## 2017-05-06 MED ORDER — PRAMIPEXOLE DIHYDROCHLORIDE 1 MG PO TABS: 1.5000 mg | ORAL_TABLET | Freq: Every day | ORAL | 1 refills | Status: DC

## 2017-05-06 MED ORDER — SUMATRIPTAN SUCCINATE 100 MG PO TABS: ORAL_TABLET | ORAL | 3 refills | Status: DC

## 2017-05-06 MED ORDER — ZOLPIDEM TARTRATE ER 12.5 MG PO TBCR: 12.5000 mg | EXTENDED_RELEASE_TABLET | Freq: Every evening | ORAL | 3 refills | Status: DC | PRN

## 2017-05-06 NOTE — Assessment & Plan Note (Signed)
Pt is not taking the Lipitor as recommended in Feb.  Repeat labs and restart meds prn.

## 2017-05-06 NOTE — Progress Notes (Signed)
Subjective:    Debra Matthews is a 61 y.o. female who presents to the office today for a preoperative consultation at the request of surgeon Dr Alvan Dame who plans on performing R medial partial knee replacement on December 3. This consultation is requested for the specific conditions prompting preoperative evaluation (i.e. because of potential affect on operative risk): Hyperlipidemia. Planned anesthesia: general. The patient has the following known anesthesia issues: no hx of complications. Patients bleeding risk: no recent abnormal bleeding and no remote history of abnormal bleeding. Patient does not have objections to receiving blood products if needed.  The following portions of the patient's history were reviewed and updated as appropriate: allergies, current medications, past family history, past medical history, past social history, past surgical history and problem list.  Review of Systems A comprehensive review of systems was negative.    Objective:    BP 121/82   Pulse 89   Temp 98.2 F (36.8 C) (Oral)   Resp 17   Ht 5\' 2"  (1.575 m)   Wt 137 lb 8 oz (62.4 kg)   SpO2 98%   BMI 25.15 kg/m   General Appearance:    Alert, cooperative, no distress, appears stated age  Head:    Normocephalic, without obvious abnormality, atraumatic  Eyes:    PERRL, conjunctiva/corneas clear, EOM's intact, fundi    benign, both eyes  Ears:    Normal TM's and external ear canals, both ears  Nose:   Nares normal, septum midline, mucosa normal, no drainage    or sinus tenderness  Throat:   Lips, mucosa, and tongue normal; teeth and gums normal  Neck:   Supple, symmetrical, trachea midline, no adenopathy;    thyroid:  no enlargement/tenderness/nodules  Back:     Symmetric, no curvature, ROM normal, no CVA tenderness  Lungs:     Clear to auscultation bilaterally, respirations unlabored  Chest Wall:    No tenderness or deformity   Heart:    Regular rate and rhythm, S1 and S2 normal, no murmur, rub   or  gallop  Breast Exam:    deferred  Abdomen:     Soft, non-tender, bowel sounds active all four quadrants,    no masses, no organomegaly  Genitalia:    deferred  Rectal:    Extremities:   Extremities normal, atraumatic, no cyanosis or edema  Pulses:   2+ and symmetric all extremities  Skin:   Skin color, texture, turgor normal, no rashes or lesions  Lymph nodes:   Cervical, supraclavicular, and axillary nodes normal  Neurologic:   CNII-XII intact, normal strength, sensation and reflexes    throughout    Predictors of intubation difficulty:  Morbid obesity? no  Anatomically abnormal facies? no  Prominent incisors? no  Receding mandible? no  Short, thick neck? no  Neck range of motion: normal  Dentition: No chipped, loose, or missing teeth.  Cardiographics ECG: normal sinus rhythm, no blocks or conduction defects, no ischemic changes Echocardiogram: not done  Imaging Chest x-ray: NA   Lab Review  Orders Only on 11/17/2016  Component Date Value  . CYTOLOGY - PAP 11/17/2016 PAP RESULT       Assessment:      61 y.o. female with planned surgery as above.   Known risk factors for perioperative complications: None   Difficulty with intubation is not anticipated.  Cardiac Risk Estimation: low  Current medications which may produce withdrawal symptoms if withheld perioperatively: none     Plan:    1.  Preoperative workup as follows ECG, hemoglobin, hematocrit, electrolytes, creatinine, glucose, liver function studies. 2. Change in medication regimen before surgery: none, continue medication regimen including morning of surgery, with sip of water. 3. Prophylaxis for cardiac events with perioperative beta-blockers: not indicated. 4. Invasive hemodynamic monitoring perioperatively: at the discretion of anesthesiologist. 5. Deep vein thrombosis prophylaxis postoperatively:regimen to be chosen by surgical team. 6. Surveillance for postoperative MI with ECG immediately  postoperatively and on postoperative days 1 and 2 AND troponin levels 24 hours postoperatively and on day 4 or hospital discharge (whichever comes first): at the discretion of anesthesiologist. 7. Other measures: none

## 2017-05-06 NOTE — Patient Instructions (Signed)
Schedule your complete physical for March/April We'll notify you of your lab results and make any changes if needed Call with any questions or concerns GOOD LUCK WITH SURGERY!!! Happy Thanksgiving!

## 2017-05-07 NOTE — H&P (Signed)
UNICOMPARTMENTAL KNEE ADMISSION H&P  Patient is being admitted for right knee arthroscopy and medial unicompartmental arthroplasty.  Subjective:  Chief Complaint:  Right knee medial compartmental primary OA /pain and lateral compartment meniscal tear    HPI: Debra Matthews, 61 y.o. female , has a history of pain and functional disability in the right and has failed non-surgical conservative treatments for greater than 12 weeks to include NSAID's and/or analgesics, corticosteriod injections, use of assistive devices and activity modification.  Onset of symptoms was gradual, starting 1+ years ago with rapidlly worsening course since that time. The patient noted no past surgery on the right knee(s).  Patient currently rates pain in the right knee(s) at 9 out of 10 with activity. Patient has night pain, worsening of pain with activity and weight bearing, pain that interferes with activities of daily living, pain with passive range of motion, crepitus and joint swelling.  Patient has evidence of periarticular osteophytes and joint space narrowing of the medial compartment by imaging studies.  There is no active infection.  Risks, benefits and expectations were discussed with the patient.  Risks including but not limited to the risk of anesthesia, blood clots, nerve damage, blood vessel damage, failure of the prosthesis, infection and up to and including death.  Patient understand the risks, benefits and expectations and wishes to proceed with surgery.   PCP: Midge Minium, MD  D/C Plans:       Home  Post-op Meds:       No Rx given   Tranexamic Acid:      To be given - IV   Decadron:      Is to be given  FYI:     ASA  Norco  DME:    Rx given for - RW   PT:   OPPT Rx given     Past Medical History:  Diagnosis Date  . Abdominal pain, epigastric   . Anemia   . Arthritis   . Chronic constipation    in past  . Cyst in hand    right wrist  . 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 GERD (gastroesophageal reflux disease)   . History of chicken pox   . History of shingles   . RLS (restless legs syndrome)   . Subchondral insufficiency fracture of femoral condyle, right, initial encounter ([place]) 07/15/2016     Past Surgical History:  Procedure Laterality Date  . TUBAL LIGATION      Allergies  Allergen Reactions  . Pseudoephedrine Other (See Comments)    Insomnia  . Prednisone     Pt feels bad, headache, no appetite.    Social History   Tobacco Use  . Smoking status: Never Smoker  . Smokeless tobacco: Never Used  Substance Use Topics  . Alcohol use: No    Alcohol/week: 0.0 oz  . Drug use: No    Family History  Problem Relation Age of Onset  . Scoliosis Mother   . Mitral valve prolapse Mother   . Colon cancer Maternal Aunt      Review of Systems  Constitutional: Negative.   HENT: Negative.   Eyes: Negative.   Respiratory: Negative.   Cardiovascular: Negative.   Gastrointestinal: Positive for heartburn.  Genitourinary: Negative.   Musculoskeletal: Positive for joint pain.  Skin: Negative.   Neurological: Negative.   Endo/Heme/Allergies: Negative.   Psychiatric/Behavioral: Negative.      Objective:   Physical Exam  Constitutional: She is well-developed, well-nourished, and  in no distress.  Eyes: Pupils are equal, round, and reactive to light.  Neck: Neck supple. No JVD present. No tracheal deviation present. No thyromegaly present.  Cardiovascular: Normal rate, regular rhythm and intact distal pulses.  Pulmonary/Chest: Effort normal and breath sounds normal. No respiratory distress. She has no wheezes.  Abdominal: Soft. There is no tenderness. There is no guarding.  Musculoskeletal:       Right knee: She exhibits swelling and bony tenderness. She exhibits no ecchymosis, no deformity, no laceration and no erythema. Tenderness found. Medial joint line and lateral joint line tenderness noted.   Lymphadenopathy:    She has no cervical adenopathy.  Neurological: She is alert.  Skin: Skin is warm and dry.       Labs:  Estimated body mass index is 25.15 kg/m as calculated from the following:   Height as of 05/06/17: 5\' 2"  (1.575 m).   Weight as of 05/06/17: 62.4 kg (137 lb 8 oz).   Imaging Review Plain radiographs demonstrate severe degenerative joint disease of the right knee(s) medial compartment. The bone quality appears to be good for age and reported activity level.  Assessment/Plan:  End stage arthritis, right knee medial compartment  The patient history, physical examination, clinical judgment of the provider and imaging studies are consistent with end stage degenerative joint disease of the right knee(s) and medial unicompartmental knee arthroplasty is deemed medically necessary. The treatment options including medical management, injection therapy arthroscopy and arthroplasty were discussed at length. The risks and benefits of total knee arthroplasty were presented and reviewed. The risks due to aseptic loosening, infection, stiffness, patella tracking problems, thromboembolic complications and other imponderables were discussed. The patient acknowledged the explanation, agreed to proceed with the plan and consent was signed. Patient is being admitted for outpatient / observation treatment for surgery, pain control, PT, OT, prophylactic antibiotics, VTE prophylaxis, progressive ambulation and ADL's and discharge planning. The patient is planning to be discharged home.    [place] Pugh Buckley Bradly   PA-C  05/07/2017, 9:14 AM

## 2017-05-12 NOTE — Progress Notes (Addendum)
ekg 05-06-17 epic  Cbc/diff and bmp  05-06-17 epic  Clearance 05-06-17 Dr. Birdie Riddle  epic

## 2017-05-12 NOTE — Patient Instructions (Signed)
ALEKHYA GRAVLIN  05/12/2017   Your procedure is scheduled on: 05/18/17  Report to [facility] Main  Entrance       Report to admitting at     0700AM   Call this number if you have problems the morning of surgery [phone]    Remember: ONLY 1 PERSON MAY GO WITH YOU TO SHORT STAY TO GET  READY MORNING OF YOUR SURGERY.  Do not eat food or drink liquids :After Midnight.     Take these medicines the morning of surgery with A SIP OF WATER: Zantac , mirapex                                You may not have any metal on your body including hair pins and              piercings  Do not wear jewelry, make-up, lotions, powders or perfumes, deodorant             Do not wear nail polish.  Do not shave  48 hours prior to surgery.     Do not bring valuables to the hospital. [place].  Contacts, dentures or bridgework may not be worn into surgery.      Patients discharged the day of surgery will not be allowed to drive home.  Name and phone number of your driver:  Special Instructions: N/A              Please read over the following fact sheets you were given: _____________________________________________________________________            [facility] - Preparing for Surgery Before surgery, you can play an important role.  Because skin is not sterile, your skin needs to be as free of germs as possible.  You can reduce the number of germs on your skin by washing with CHG (chlorahexidine gluconate) soap before surgery.  CHG is an antiseptic cleaner which kills germs and bonds with the skin to continue killing germs even after washing. Please DO NOT use if you have an allergy to CHG or antibacterial soaps.  If your skin becomes reddened/irritated stop using the CHG and inform your nurse when you arrive at Short Stay. Do not shave (including legs and underarms) for at least 48 hours prior to the first CHG shower.  You may  shave your face/neck. Please follow these instructions carefully:  1.  Shower with CHG Soap the night before surgery and the  morning of Surgery.  2.  If you choose to wash your hair, wash your hair first as usual with your  normal  shampoo.  3.  After you shampoo, rinse your hair and body thoroughly to remove the  shampoo.                           4.  Use CHG as you would any other liquid soap.  You can apply chg directly  to the skin and wash                       Gently with a scrungie or clean washcloth.  5.  Apply the CHG Soap to your body  ONLY FROM THE NECK DOWN.   Do not use on face/ open                           Wound or open sores. Avoid contact with eyes, ears mouth and genitals (private parts).                       Wash face,  Genitals (private parts) with your normal soap.             6.  Wash thoroughly, paying special attention to the area where your surgery  will be performed.  7.  Thoroughly rinse your body with warm water from the neck down.  8.  DO NOT shower/wash with your normal soap after using and rinsing off  the CHG Soap.                9.  Pat yourself dry with a clean towel.            10.  Wear clean pajamas.            11.  Place clean sheets on your bed the night of your first shower and do not  sleep with pets. Day of Surgery : Do not apply any lotions/deodorants the morning of surgery.  Please wear clean clothes to the hospital/surgery center.  FAILURE TO FOLLOW THESE INSTRUCTIONS MAY RESULT IN THE CANCELLATION OF YOUR SURGERY PATIENT SIGNATURE_________________________________  NURSE SIGNATURE__________________________________  ________________________________________________________________________  WHAT IS A BLOOD TRANSFUSION? Blood Transfusion Information  A transfusion is the replacement of blood or some of its parts. Blood is made up of multiple cells which provide different functions.  Red blood cells carry oxygen and are used for blood loss  replacement.  White blood cells fight against infection.  Platelets control bleeding.  Plasma helps clot blood.  Other blood products are available for specialized needs, such as hemophilia or other clotting disorders. BEFORE THE TRANSFUSION  Who gives blood for transfusions?   Healthy volunteers who are fully evaluated to make sure their blood is safe. This is blood bank blood. Transfusion therapy is the safest it has ever been in the practice of medicine. Before blood is taken from a donor, a complete history is taken to make sure that person has no history of diseases nor engages in risky social behavior (examples are intravenous drug use or sexual activity with multiple partners). The donor's travel history is screened to minimize risk of transmitting infections, such as malaria. The donated blood is tested for signs of infectious diseases, such as HIV and hepatitis. The blood is then tested to be sure it is compatible with you in order to minimize the chance of a transfusion reaction. If you or a relative donates blood, this is often done in anticipation of surgery and is not appropriate for emergency situations. It takes many days to process the donated blood. RISKS AND COMPLICATIONS Although transfusion therapy is very safe and saves many lives, the main dangers of transfusion include:   Getting an infectious disease.  Developing a transfusion reaction. This is an allergic reaction to something in the blood you were given. Every precaution is taken to prevent this. The decision to have a blood transfusion has been considered carefully by your caregiver before blood is given. Blood is not given unless the benefits outweigh the risks. AFTER THE TRANSFUSION  Right after receiving a blood transfusion, you will usually feel much  better and more energetic. This is especially true if your red blood cells have gotten low (anemic). The transfusion raises the level of the red blood cells which  carry oxygen, and this usually causes an energy increase.  The nurse administering the transfusion will monitor you carefully for complications. HOME CARE INSTRUCTIONS  No special instructions are needed after a transfusion. You may find your energy is better. Speak with your caregiver about any limitations on activity for underlying diseases you may have. SEEK MEDICAL CARE IF:   Your condition is not improving after your transfusion.  You develop redness or irritation at the intravenous (IV) site. SEEK IMMEDIATE MEDICAL CARE IF:  Any of the following symptoms occur over the next 12 hours:  Shaking chills.  You have a temperature by mouth above 102 F (38.9 C), not controlled by medicine.  Chest, back, or muscle pain.  People around you feel you are not acting correctly or are confused.  Shortness of breath or difficulty breathing.  Dizziness and fainting.  You get a rash or develop hives.  You have a decrease in urine output.  Your urine turns a dark color or changes to pink, red, or brown. Any of the following symptoms occur over the next 10 days:  You have a temperature by mouth above 102 F (38.9 C), not controlled by medicine.  Shortness of breath.  Weakness after normal activity.  The white part of the eye turns yellow (jaundice).  You have a decrease in the amount of urine or are urinating less often.  Your urine turns a dark color or changes to pink, red, or brown. Document Released: 05/30/2000 Document Revised: 08/25/2011 Document Reviewed: 01/17/2008 ExitCare Patient Information 2014 [place].  _______________________________________________________________________  Incentive Spirometer  An incentive spirometer is a tool that can help keep your lungs clear and active. This tool measures how well you are filling your lungs with each breath. Taking long deep breaths may help reverse or decrease the chance of developing breathing (pulmonary) problems  (especially infection) following:  A long period of time when you are unable to move or be active. BEFORE THE PROCEDURE   If the spirometer includes an indicator to show your best effort, your nurse or respiratory therapist will set it to a desired goal.  If possible, sit up straight or lean slightly forward. Try not to slouch.  Hold the incentive spirometer in an upright position. INSTRUCTIONS FOR USE  1. Sit on the edge of your bed if possible, or sit up as far as you can in bed or on a chair. 2. Hold the incentive spirometer in an upright position. 3. Breathe out normally. 4. Place the mouthpiece in your mouth and seal your lips tightly around it. 5. Breathe in slowly and as deeply as possible, raising the piston or the ball toward the top of the column. 6. Hold your breath for 3-5 seconds or for as long as possible. Allow the piston or ball to fall to the bottom of the column. 7. Remove the mouthpiece from your mouth and breathe out normally. 8. Rest for a few seconds and repeat Steps 1 through 7 at least 10 times every 1-2 hours when you are awake. Take your time and take a few normal breaths between deep breaths. 9. The spirometer may include an indicator to show your best effort. Use the indicator as a goal to work toward during each repetition. 10. After each set of 10 deep breaths, practice coughing to be sure  your lungs are clear. If you have an incision (the cut made at the time of surgery), support your incision when coughing by placing a pillow or rolled up towels firmly against it. Once you are able to get out of bed, walk around indoors and cough well. You may stop using the incentive spirometer when instructed by your caregiver.  RISKS AND COMPLICATIONS  Take your time so you do not get dizzy or light-headed.  If you are in pain, you may need to take or ask for pain medication before doing incentive spirometry. It is harder to take a deep breath if you are having  pain. AFTER USE  Rest and breathe slowly and easily.  It can be helpful to keep track of a log of your progress. Your caregiver can provide you with a simple table to help with this. If you are using the spirometer at home, follow these instructions: [place] IF:   You are having difficultly using the spirometer.  You have trouble using the spirometer as often as instructed.  Your pain medication is not giving enough relief while using the spirometer.  You develop fever of 100.5 F (38.1 C) or higher. SEEK IMMEDIATE MEDICAL CARE IF:   You cough up bloody sputum that had not been present before.  You develop fever of 102 F (38.9 C) or greater.  You develop worsening pain at or near the incision site. MAKE SURE YOU:   Understand these instructions.  Will watch your condition.  Will get help right away if you are not doing well or get worse. Document Released: 10/13/2006 Document Revised: 08/25/2011 Document Reviewed: 12/14/2006 [facility] Patient Information 2014 [place], [place].   ________________________________________________________________________

## 2017-05-13 ENCOUNTER — Encounter (HOSPITAL_COMMUNITY): Payer: Self-pay

## 2017-05-13 ENCOUNTER — Encounter (HOSPITAL_COMMUNITY)
Admission: RE | Admit: 2017-05-13 | Discharge: 2017-05-13 | Disposition: A | Discharge status: Home / Self Care | Payer: 59 | Source: Ambulatory Visit | Attending: Orthopedic Surgery | Admitting: Orthopedic Surgery

## 2017-05-13 ENCOUNTER — Other Ambulatory Visit: Payer: Self-pay

## 2017-05-13 DIAGNOSIS — M25561 Pain in right knee: Secondary | ICD-10-CM | POA: Diagnosis not present

## 2017-05-13 DIAGNOSIS — Z01812 Encounter for preprocedural laboratory examination: Secondary | ICD-10-CM | POA: Insufficient documentation

## 2017-05-13 DIAGNOSIS — M1711 Unilateral primary osteoarthritis, right knee: Secondary | ICD-10-CM | POA: Diagnosis not present

## 2017-05-13 HISTORY — DX: Malignant (primary) neoplasm, unspecified: C80.1

## 2017-05-13 HISTORY — DX: Headache, unspecified: R51.9

## 2017-05-13 HISTORY — DX: Headache: R51

## 2017-05-13 LAB — SURGICAL PCR SCREEN
MRSA, PCR: NEGATIVE
Staphylococcus aureus: NEGATIVE

## 2017-05-14 LAB — ABO/RH: ABO/RH(D): B NEG

## 2017-05-18 ENCOUNTER — Ambulatory Visit (HOSPITAL_COMMUNITY): Payer: 59 | Admitting: Certified Registered Nurse Anesthetist

## 2017-05-18 ENCOUNTER — Encounter (HOSPITAL_COMMUNITY): Payer: Self-pay | Admitting: *Deleted

## 2017-05-18 ENCOUNTER — Other Ambulatory Visit: Payer: Self-pay

## 2017-05-18 ENCOUNTER — Encounter (HOSPITAL_COMMUNITY)
Admission: AD | Disposition: A | Discharge status: Home / Self Care | Payer: Self-pay | Source: Ambulatory Visit | Attending: Orthopedic Surgery

## 2017-05-18 ENCOUNTER — Observation Stay (HOSPITAL_COMMUNITY)
Admission: AD | Admit: 2017-05-18 | Discharge: 2017-05-19 | Disposition: A | Discharge status: Home / Self Care | Payer: 59 | Source: Ambulatory Visit | Attending: Orthopedic Surgery | Admitting: Orthopedic Surgery

## 2017-05-18 DIAGNOSIS — Z85828 Personal history of other malignant neoplasm of skin: Secondary | ICD-10-CM | POA: Diagnosis not present

## 2017-05-18 DIAGNOSIS — M23241 Derangement of anterior horn of lateral meniscus due to old tear or injury, right knee: Secondary | ICD-10-CM | POA: Insufficient documentation

## 2017-05-18 DIAGNOSIS — D500 Iron deficiency anemia secondary to blood loss (chronic): Secondary | ICD-10-CM | POA: Diagnosis not present

## 2017-05-18 DIAGNOSIS — K219 Gastro-esophageal reflux disease without esophagitis: Secondary | ICD-10-CM | POA: Insufficient documentation

## 2017-05-18 DIAGNOSIS — M25761 Osteophyte, right knee: Secondary | ICD-10-CM | POA: Diagnosis not present

## 2017-05-18 DIAGNOSIS — Z23 Encounter for immunization: Secondary | ICD-10-CM | POA: Insufficient documentation

## 2017-05-18 DIAGNOSIS — M1711 Unilateral primary osteoarthritis, right knee: Principal | ICD-10-CM | POA: Insufficient documentation

## 2017-05-18 DIAGNOSIS — Z96651 Presence of right artificial knee joint: Secondary | ICD-10-CM

## 2017-05-18 DIAGNOSIS — G8918 Other acute postprocedural pain: Secondary | ICD-10-CM | POA: Diagnosis not present

## 2017-05-18 DIAGNOSIS — M23341 Other meniscus derangements, anterior horn of lateral meniscus, right knee: Secondary | ICD-10-CM | POA: Diagnosis not present

## 2017-05-18 DIAGNOSIS — E785 Hyperlipidemia, unspecified: Secondary | ICD-10-CM | POA: Diagnosis not present

## 2017-05-18 HISTORY — PX: MEDIAL PARTIAL KNEE REPLACEMENT: SHX5965

## 2017-05-18 HISTORY — PX: KNEE ARTHROSCOPY WITH MENISCAL REPAIR: SHX5653

## 2017-05-18 LAB — TYPE AND SCREEN
ABO/RH(D): B NEG
ANTIBODY SCREEN: NEGATIVE

## 2017-05-18 SURGERY — MEDIAL PARTIAL KNEE REPLACEMENT
Anesthesia: Spinal | Site: Knee | Laterality: Right

## 2017-05-18 MED ORDER — KETOROLAC TROMETHAMINE 30 MG/ML IJ SOLN
INTRAMUSCULAR | Status: DC | PRN
  Administered 2017-05-18: 30 mg via INTRAVENOUS

## 2017-05-18 MED ORDER — DEXAMETHASONE SODIUM PHOSPHATE 10 MG/ML IJ SOLN: 10.0000 mg | Freq: Once | INTRAMUSCULAR | Status: DC

## 2017-05-18 MED ORDER — HYDROCODONE-ACETAMINOPHEN 7.5-325 MG PO TABS
2.0000 | ORAL_TABLET | ORAL | Status: DC | PRN
  Administered 2017-05-18 – 2017-05-19 (×4): 2 via ORAL
  Filled 2017-05-18 (×4): qty 2

## 2017-05-18 MED ORDER — METOCLOPRAMIDE HCL 5 MG/ML IJ SOLN: 5.0000 mg | Freq: Three times a day (TID) | INTRAMUSCULAR | Status: DC | PRN

## 2017-05-18 MED ORDER — CHLORHEXIDINE GLUCONATE 4 % EX LIQD: 60.0000 mL | Freq: Once | CUTANEOUS | Status: DC

## 2017-05-18 MED ORDER — TRANEXAMIC ACID 1000 MG/10ML IV SOLN
1000.0000 mg | INTRAVENOUS | Status: AC
  Administered 2017-05-18: 1000 mg via INTRAVENOUS
  Filled 2017-05-18: qty 1100

## 2017-05-18 MED ORDER — DEXAMETHASONE SODIUM PHOSPHATE 10 MG/ML IJ SOLN
10.0000 mg | Freq: Once | INTRAMUSCULAR | Status: AC
  Administered 2017-05-18: 10 mg via INTRAVENOUS

## 2017-05-18 MED ORDER — BUPIVACAINE-EPINEPHRINE (PF) 0.25% -1:200000 IJ SOLN
INTRAMUSCULAR | Status: AC
  Filled 2017-05-18: qty 30

## 2017-05-18 MED ORDER — PHENOL 1.4 % MT LIQD: 1.0000 | OROMUCOSAL | Status: DC | PRN

## 2017-05-18 MED ORDER — MIDAZOLAM HCL 2 MG/2ML IJ SOLN
1.0000 mg | INTRAMUSCULAR | Status: DC
  Administered 2017-05-18 (×2): 1 mg via INTRAVENOUS

## 2017-05-18 MED ORDER — MAGNESIUM CITRATE PO SOLN: 1.0000 | Freq: Once | ORAL | Status: DC | PRN

## 2017-05-18 MED ORDER — ONDANSETRON HCL 4 MG/2ML IJ SOLN
INTRAMUSCULAR | Status: DC | PRN
  Administered 2017-05-18: 4 mg via INTRAVENOUS

## 2017-05-18 MED ORDER — ONDANSETRON HCL 4 MG PO TABS: 4.0000 mg | ORAL_TABLET | Freq: Four times a day (QID) | ORAL | Status: DC | PRN

## 2017-05-18 MED ORDER — POLYETHYLENE GLYCOL 3350 17 G PO PACK
17.0000 g | PACK | Freq: Two times a day (BID) | ORAL | Status: DC
  Administered 2017-05-19: 17 g via ORAL
  Filled 2017-05-18: qty 1

## 2017-05-18 MED ORDER — EPHEDRINE SULFATE-NACL 50-0.9 MG/10ML-% IV SOSY
PREFILLED_SYRINGE | INTRAVENOUS | Status: DC | PRN
  Administered 2017-05-18 (×5): 5 mg via INTRAVENOUS

## 2017-05-18 MED ORDER — FENTANYL CITRATE (PF) 100 MCG/2ML IJ SOLN
50.0000 ug | INTRAMUSCULAR | Status: DC
  Administered 2017-05-18 (×2): 50 ug via INTRAVENOUS

## 2017-05-18 MED ORDER — METOCLOPRAMIDE HCL 5 MG PO TABS: 5.0000 mg | ORAL_TABLET | Freq: Three times a day (TID) | ORAL | Status: DC | PRN

## 2017-05-18 MED ORDER — PROPOFOL 10 MG/ML IV BOLUS
INTRAVENOUS | Status: DC | PRN
Start: 2017-05-18 — End: 2017-05-18
  Administered 2017-05-18: 30 mg via INTRAVENOUS

## 2017-05-18 MED ORDER — MEPERIDINE HCL 50 MG/ML IJ SOLN
INTRAMUSCULAR | Status: AC
  Filled 2017-05-18: qty 1

## 2017-05-18 MED ORDER — HYDROMORPHONE HCL 1 MG/ML IJ SOLN: 0.2500 mg | INTRAMUSCULAR | Status: DC | PRN

## 2017-05-18 MED ORDER — MIDAZOLAM HCL 2 MG/2ML IJ SOLN
INTRAMUSCULAR | Status: AC
  Administered 2017-05-18: 1 mg via INTRAVENOUS
  Filled 2017-05-18: qty 2

## 2017-05-18 MED ORDER — CEFAZOLIN SODIUM-DEXTROSE 2-4 GM/100ML-% IV SOLN
INTRAVENOUS | Status: AC
  Filled 2017-05-18: qty 100

## 2017-05-18 MED ORDER — HYDROCODONE-ACETAMINOPHEN 7.5-325 MG PO TABS: 1.0000 | ORAL_TABLET | ORAL | Status: DC | PRN

## 2017-05-18 MED ORDER — DOCUSATE SODIUM 100 MG PO CAPS: 100.0000 mg | ORAL_CAPSULE | Freq: Two times a day (BID) | ORAL | 0 refills | Status: DC

## 2017-05-18 MED ORDER — METHOCARBAMOL 1000 MG/10ML IJ SOLN
500.0000 mg | Freq: Four times a day (QID) | INTRAVENOUS | Status: DC | PRN
  Filled 2017-05-18: qty 5

## 2017-05-18 MED ORDER — SODIUM CHLORIDE 0.9 % IJ SOLN
INTRAMUSCULAR | Status: AC
  Filled 2017-05-18: qty 50

## 2017-05-18 MED ORDER — BISACODYL 10 MG RE SUPP: 10.0000 mg | Freq: Every day | RECTAL | Status: DC | PRN

## 2017-05-18 MED ORDER — SODIUM CHLORIDE 0.9 % IV BOLUS (SEPSIS): 500.0000 mL | Freq: Once | INTRAVENOUS | Status: DC

## 2017-05-18 MED ORDER — MEPERIDINE HCL 50 MG/ML IJ SOLN
6.2500 mg | INTRAMUSCULAR | Status: DC | PRN
  Administered 2017-05-18 (×2): 12.5 mg via INTRAVENOUS

## 2017-05-18 MED ORDER — HYDROCODONE-ACETAMINOPHEN 7.5-325 MG PO TABS: 1.0000 | ORAL_TABLET | ORAL | 0 refills | Status: DC | PRN

## 2017-05-18 MED ORDER — FERROUS SULFATE 325 (65 FE) MG PO TABS
325.0000 mg | ORAL_TABLET | Freq: Three times a day (TID) | ORAL | Status: DC
  Administered 2017-05-19: 325 mg via ORAL
  Filled 2017-05-18: qty 1

## 2017-05-18 MED ORDER — 0.9 % SODIUM CHLORIDE (POUR BTL) OPTIME
TOPICAL | Status: DC | PRN
  Administered 2017-05-18: 1000 mL

## 2017-05-18 MED ORDER — KETOROLAC TROMETHAMINE 30 MG/ML IJ SOLN
INTRAMUSCULAR | Status: AC
  Filled 2017-05-18: qty 1

## 2017-05-18 MED ORDER — ASPIRIN 81 MG PO CHEW: 81.0000 mg | CHEWABLE_TABLET | Freq: Two times a day (BID) | ORAL | 0 refills | Status: AC

## 2017-05-18 MED ORDER — FENTANYL CITRATE (PF) 100 MCG/2ML IJ SOLN
INTRAMUSCULAR | Status: AC
  Administered 2017-05-18: 50 ug via INTRAVENOUS
  Filled 2017-05-18: qty 2

## 2017-05-18 MED ORDER — ONDANSETRON HCL 4 MG/2ML IJ SOLN: 4.0000 mg | Freq: Four times a day (QID) | INTRAMUSCULAR | Status: DC | PRN

## 2017-05-18 MED ORDER — CEFAZOLIN SODIUM-DEXTROSE 2-4 GM/100ML-% IV SOLN
2.0000 g | INTRAVENOUS | Status: AC
  Administered 2017-05-18: 2 g via INTRAVENOUS

## 2017-05-18 MED ORDER — CELECOXIB 200 MG PO CAPS
200.0000 mg | ORAL_CAPSULE | Freq: Two times a day (BID) | ORAL | Status: DC
  Administered 2017-05-18 – 2017-05-19 (×2): 200 mg via ORAL
  Filled 2017-05-18 (×2): qty 1

## 2017-05-18 MED ORDER — SODIUM CHLORIDE 0.9 % IJ SOLN
INTRAMUSCULAR | Status: DC | PRN
  Administered 2017-05-18: 29 mL via INTRAVENOUS

## 2017-05-18 MED ORDER — METHOCARBAMOL 500 MG PO TABS: 500.0000 mg | ORAL_TABLET | Freq: Four times a day (QID) | ORAL | 0 refills | Status: DC | PRN

## 2017-05-18 MED ORDER — DOCUSATE SODIUM 100 MG PO CAPS
100.0000 mg | ORAL_CAPSULE | Freq: Two times a day (BID) | ORAL | Status: DC
  Administered 2017-05-18 – 2017-05-19 (×2): 100 mg via ORAL
  Filled 2017-05-18 (×2): qty 1

## 2017-05-18 MED ORDER — ACETAMINOPHEN 325 MG PO TABS
650.0000 mg | ORAL_TABLET | ORAL | Status: DC | PRN
  Administered 2017-05-18: 650 mg via ORAL
  Filled 2017-05-18: qty 2

## 2017-05-18 MED ORDER — BUPIVACAINE HCL (PF) 0.75 % IJ SOLN
INTRAMUSCULAR | Status: DC | PRN
  Administered 2017-05-18: 1.8 mL via INTRATHECAL

## 2017-05-18 MED ORDER — PROPOFOL 10 MG/ML IV BOLUS
INTRAVENOUS | Status: AC
  Filled 2017-05-18: qty 60

## 2017-05-18 MED ORDER — DIPHENHYDRAMINE HCL 12.5 MG/5ML PO ELIX: 12.5000 mg | ORAL_SOLUTION | ORAL | Status: DC | PRN

## 2017-05-18 MED ORDER — MENTHOL 3 MG MT LOZG: 1.0000 | LOZENGE | OROMUCOSAL | Status: DC | PRN

## 2017-05-18 MED ORDER — BUPIVACAINE-EPINEPHRINE 0.25% -1:200000 IJ SOLN
INTRAMUSCULAR | Status: DC | PRN
  Administered 2017-05-18: 30 mL

## 2017-05-18 MED ORDER — PROPOFOL 500 MG/50ML IV EMUL
INTRAVENOUS | Status: DC | PRN
  Administered 2017-05-18: 100 ug/kg/min via INTRAVENOUS

## 2017-05-18 MED ORDER — METHOCARBAMOL 500 MG PO TABS
500.0000 mg | ORAL_TABLET | Freq: Four times a day (QID) | ORAL | Status: DC | PRN
  Administered 2017-05-19: 500 mg via ORAL
  Filled 2017-05-18: qty 1

## 2017-05-18 MED ORDER — EPHEDRINE 5 MG/ML INJ
INTRAVENOUS | Status: AC
  Filled 2017-05-18: qty 10

## 2017-05-18 MED ORDER — POLYETHYLENE GLYCOL 3350 17 G PO PACK: 17.0000 g | PACK | Freq: Two times a day (BID) | ORAL | 0 refills | Status: DC

## 2017-05-18 MED ORDER — ALUM & MAG HYDROXIDE-SIMETH 200-200-20 MG/5ML PO SUSP: 15.0000 mL | ORAL | Status: DC | PRN

## 2017-05-18 MED ORDER — TRANEXAMIC ACID 1000 MG/10ML IV SOLN
1000.0000 mg | Freq: Once | INTRAVENOUS | Status: AC
  Administered 2017-05-18: 1000 mg via INTRAVENOUS
  Filled 2017-05-18: qty 1100

## 2017-05-18 MED ORDER — KETOROLAC TROMETHAMINE 30 MG/ML IJ SOLN: 30.0000 mg | Freq: Once | INTRAMUSCULAR | Status: DC | PRN

## 2017-05-18 MED ORDER — ACETAMINOPHEN 650 MG RE SUPP: 650.0000 mg | RECTAL | Status: DC | PRN

## 2017-05-18 MED ORDER — PROMETHAZINE HCL 25 MG/ML IJ SOLN: 6.2500 mg | INTRAMUSCULAR | Status: DC | PRN

## 2017-05-18 MED ORDER — ASPIRIN 81 MG PO CHEW
81.0000 mg | CHEWABLE_TABLET | Freq: Two times a day (BID) | ORAL | Status: DC
  Administered 2017-05-18 – 2017-05-19 (×2): 81 mg via ORAL
  Filled 2017-05-18 (×2): qty 1

## 2017-05-18 MED ORDER — CEFAZOLIN SODIUM-DEXTROSE 2-4 GM/100ML-% IV SOLN
2.0000 g | Freq: Four times a day (QID) | INTRAVENOUS | Status: AC
  Administered 2017-05-18 – 2017-05-19 (×2): 2 g via INTRAVENOUS
  Filled 2017-05-18 (×2): qty 100

## 2017-05-18 MED ORDER — SODIUM CHLORIDE 0.9 % IV SOLN
INTRAVENOUS | Status: DC
  Administered 2017-05-18: 20:00:00 via INTRAVENOUS

## 2017-05-18 MED ORDER — FERROUS SULFATE 325 (65 FE) MG PO TABS: 325.0000 mg | ORAL_TABLET | Freq: Three times a day (TID) | ORAL | 3 refills | Status: DC

## 2017-05-18 MED ORDER — LACTATED RINGERS IV SOLN
INTRAVENOUS | Status: DC
  Administered 2017-05-18 (×3): via INTRAVENOUS

## 2017-05-18 MED ORDER — HYDROMORPHONE HCL 1 MG/ML IJ SOLN: 0.5000 mg | INTRAMUSCULAR | Status: DC | PRN

## 2017-05-18 SURGICAL SUPPLY — 49 items
ADH SKN CLS APL DERMABOND .7 (GAUZE/BANDAGES/DRESSINGS) ×1
BAG SPEC THK2 15X12 ZIP CLS (MISCELLANEOUS) ×1
BAG ZIPLOCK 12X15 (MISCELLANEOUS) ×1 IMPLANT
BANDAGE ACE 6X5 VEL STRL LF (GAUZE/BANDAGES/DRESSINGS) ×2 IMPLANT
BANDAGE ELASTIC 6 VELCRO ST LF (GAUZE/BANDAGES/DRESSINGS) ×1 IMPLANT
BLADE SAW RECIPROCATING 77.5 (BLADE) ×2 IMPLANT
BLADE SAW SGTL 13.0X1.19X90.0M (BLADE) ×2 IMPLANT
BOWL SMART MIX CTS (DISPOSABLE) ×2 IMPLANT
CAPT KNEE PARTIAL 2 ×1 IMPLANT
CEMENT HV SMART SET (Cement) ×1 IMPLANT
CLOTH BEACON ORANGE TIMEOUT ST (SAFETY) ×2 IMPLANT
COVER SURGICAL LIGHT HANDLE (MISCELLANEOUS) ×2 IMPLANT
CUFF TOURN SGL QUICK 34 (TOURNIQUET CUFF) ×2
CUFF TRNQT CYL 34X4X40X1 (TOURNIQUET CUFF) ×1 IMPLANT
DERMABOND ADVANCED (GAUZE/BANDAGES/DRESSINGS) ×1
DERMABOND ADVANCED .7 DNX12 (GAUZE/BANDAGES/DRESSINGS) ×1 IMPLANT
DRAPE U-SHAPE 47X51 STRL (DRAPES) ×2 IMPLANT
DRESSING AQUACEL AG SP 3.5X10 (GAUZE/BANDAGES/DRESSINGS) ×1 IMPLANT
DRSG AQUACEL AG SP 3.5X10 (GAUZE/BANDAGES/DRESSINGS) ×2
DURAPREP 26ML APPLICATOR (WOUND CARE) ×4 IMPLANT
ELECT REM PT RETURN 15FT ADLT (MISCELLANEOUS) ×2 IMPLANT
GLOVE BIO SURGEON STRL SZ 6.5 (GLOVE) ×1 IMPLANT
GLOVE BIO SURGEON STRL SZ7 (GLOVE) ×1 IMPLANT
GLOVE BIOGEL PI IND STRL 6.5 (GLOVE) IMPLANT
GLOVE BIOGEL PI IND STRL 7.0 (GLOVE) IMPLANT
GLOVE BIOGEL PI IND STRL 7.5 (GLOVE) ×1 IMPLANT
GLOVE BIOGEL PI IND STRL 8.5 (GLOVE) ×1 IMPLANT
GLOVE BIOGEL PI INDICATOR 6.5 (GLOVE) ×1
GLOVE BIOGEL PI INDICATOR 7.0 (GLOVE) ×1
GLOVE BIOGEL PI INDICATOR 7.5 (GLOVE) ×3
GLOVE BIOGEL PI INDICATOR 8.5 (GLOVE) ×2
GLOVE ECLIPSE 8.0 STRL XLNG CF (GLOVE) ×3 IMPLANT
GLOVE ORTHO TXT STRL SZ7.5 (GLOVE) ×4 IMPLANT
GOWN STRL REUS W/ TWL XL LVL3 (GOWN DISPOSABLE) IMPLANT
GOWN STRL REUS W/TWL LRG LVL3 (GOWN DISPOSABLE) ×2 IMPLANT
GOWN STRL REUS W/TWL XL LVL3 (GOWN DISPOSABLE) ×5 IMPLANT
LEGGING LITHOTOMY PAIR STRL (DRAPES) ×2 IMPLANT
MANIFOLD NEPTUNE II (INSTRUMENTS) ×2 IMPLANT
PACK BLADE SAW RECIP 70 3 PT (BLADE) ×1 IMPLANT
PACK TOTAL KNEE CUSTOM (KITS) ×2 IMPLANT
SUT ETHILON 4 0 PS 2 18 (SUTURE) ×1 IMPLANT
SUT MNCRL AB 4-0 PS2 18 (SUTURE) ×2 IMPLANT
SUT STRATAFIX 0 PDS 27 VIOLET (SUTURE) ×2
SUT VIC AB 1 CT1 36 (SUTURE) ×2 IMPLANT
SUT VIC AB 2-0 CT1 27 (SUTURE) ×4
SUT VIC AB 2-0 CT1 TAPERPNT 27 (SUTURE) ×2 IMPLANT
SUTURE STRATFX 0 PDS 27 VIOLET (SUTURE) ×1 IMPLANT
SYR 50ML LL SCALE MARK (SYRINGE) ×2 IMPLANT
TRAY FOLEY CATH SILVER 14FR (SET/KITS/TRAYS/PACK) ×1 IMPLANT

## 2017-05-18 NOTE — Progress Notes (Signed)
Assisted Dr. Hatchett with right, ultrasound guided, adductor canal block. Side rails up, monitors on throughout procedure. See vital signs in flow sheet. Tolerated Procedure well.  

## 2017-05-18 NOTE — Anesthesia Postprocedure Evaluation (Signed)
Anesthesia Post Note  Patient: Debra Matthews  Procedure(s) Performed: RIGHT MEDIAL PARTIAL KNEE REPLACEMENT (Right Knee) KNEE ARTHROSCOPY WITH PARTIAL LATERAL MENISCECTOMY (Right Knee)     Patient location during evaluation: PACU Anesthesia Type: Spinal Level of consciousness: awake Pain management: pain level controlled Vital Signs Assessment: post-procedure vital signs reviewed and stable Respiratory status: spontaneous breathing Cardiovascular status: stable Postop Assessment: no headache, no backache, spinal receding, patient able to bend at knees and no apparent nausea or vomiting Anesthetic complications: no    Last Vitals:  Vitals:   05/18/17 1330 05/18/17 1345  BP: 138/70 135/72  Pulse: 84 79  Resp: 18 18  Temp:    SpO2: 99% 99%    Last Pain:  Vitals:   05/18/17 1300  TempSrc:   PainSc: Asleep   Pain Goal: Patients Stated Pain Goal: 4 (05/18/17 0721)               Lithzy Bernard JR,JOHN Mateo Flow

## 2017-05-18 NOTE — Anesthesia Preprocedure Evaluation (Signed)
Anesthesia Evaluation  Patient identified by MRN, date of birth, ID band Patient awake    Reviewed: Allergy & Precautions, NPO status , Patient's Chart, lab work & pertinent test results  Airway Mallampati: I       Dental no notable dental hx. (+) Teeth Intact   Pulmonary neg pulmonary ROS,    Pulmonary exam normal breath sounds clear to auscultation       Cardiovascular Normal cardiovascular exam Rhythm:Regular Rate:Normal     Neuro/Psych    GI/Hepatic Neg liver ROS, GERD  Medicated and Controlled,  Endo/Other  negative endocrine ROS  Renal/GU negative Renal ROS  negative genitourinary   Musculoskeletal   Abdominal Normal abdominal exam  (+)   Peds  Hematology   Anesthesia Other Findings   Reproductive/Obstetrics                             Anesthesia Physical Anesthesia Plan  ASA: II  Anesthesia Plan: Spinal   Post-op Pain Management:  Regional for Post-op pain   Induction:   PONV Risk Score and Plan: 2 and Ondansetron and Dexamethasone  Airway Management Planned: Natural Airway and Nasal Cannula  Additional Equipment:   Intra-op Plan:   Post-operative Plan:   Informed Consent: I have reviewed the patients History and Physical, chart, labs and discussed the procedure including the risks, benefits and alternatives for the proposed anesthesia with the patient or authorized representative who has indicated his/her understanding and acceptance.     Plan Discussed with: CRNA and Surgeon  Anesthesia Plan Comments:         Anesthesia Quick Evaluation

## 2017-05-18 NOTE — Op Note (Signed)
Debra Matthews, Debra Matthews NO.:  [id number]  MEDICAL RECORD NO.:  [id number]  LOCATION:  [place]                         FACILITY:  [facility]  PHYSICIAN:  Pietro Cassis. Alvan Dame, M.D.  DATE OF BIRTH:  01/27/1956  DATE OF PROCEDURE:  05/18/2017 DATE OF DISCHARGE:                              OPERATIVE REPORT   PREOPERATIVE DIAGNOSIS:  Right knee medial-based osteoarthritis with possible lateral meniscal tearing.  POSTOPERATIVE DIAGNOSES/FINDINGS: 1. Right knee advanced medial compartment osteoarthritis. 2. Degenerative tearing to the central portion of the midbody anterior     horn of the lateral meniscus.  PROCEDURES: 1. Right knee diagnostic and operative arthroscopy with lateral     partial meniscectomy. 2. Right knee medial compartment arthroplasty utilizing Oxford knee     system with a size extra small femur, a right medial tibial base     plate, and a size 4 insert.  SURGEON:  Pietro Cassis. Alvan Dame, M.D.  ASSISTANT:  Danae Orleans, PA-C.  ANESTHESIA:  Regional plus spinal block.  SPECIMENS:  None.  COMPLICATION:  None.  BLOOD LOSS:  Less than 50 mL.  TOURNIQUET:  Up for 38 minutes at 250 mmHg.  DRAINS:  None.  INDICATIONS FOR PROCEDURE:  Debra Matthews is a pleasant 61 year old female who was seen and evaluated for referral-based surgical consideration of her right knee pain.  She had predominance of her pain on the medial side of her joint, but previous MRI had indicated concern of lateral meniscus.  We discussed pros and cons of conservatively treating her arthroscopically addressing medial meniscal and lateral meniscal tearing as well as degenerative changes.  I was concerned about the overall degenerative change identified radiographically and by MRI in addition to her subchondral edematous changes identified.  I felt her best option was to evaluate and address the lateral meniscus, but at the same time, perform a more definitive management of her medial-based  arthritis.  We subsequently reviewed the risks of infection, DVT, component failure, need for future surgery.  We discussed the postoperative course and expectations from partial knee arthroplasty.  She wished at this point to proceed in this fashion as opposed to arthroscopic surgery and more importantly as opposed to a total knee arthroplasty.  PROCEDURE IN DETAIL:  The patient was brought to the operative theater. Once adequate anesthesia, preoperative antibiotics, 2 g of Ancef, 1 g of tranexamic acid and 10 mg of Decadron, she was positioned supine with a right thigh tourniquet placed.  The right leg was allowed to drape over the Oxford leg holder to allow for 120 degrees of flexion.  A time-out was performed identifying the patient, planned procedure and extremity.  Leg was exsanguinated.  Tourniquet elevated to 250 mmHg.  I first began the procedure with the arthroscopic portion of the case.  I did go ahead and make my medial-based incision, created soft tissue planes.  I then created the inferolateral portal site.  Arthroscopic camera was inserted. Diagnostic evaluation of the knee revealed intact patellofemoral compartment, advanced medial compartment osteoarthritis as expected.  In addition, there was noted to be central tearing to the midbody, anterior horn of the lateral meniscus.  The inferior medial portal  was created in line with the anticipated arthrotomy site.  I used the 3.5 Cuda shaver to debride the meniscus back to a stable level.  There was very minor articular chondral fraying, but no evidence of any degenerative changes that were significant laterally.  Once I was satisfied with the partial lateral meniscectomy and pictures taken, the instrumentation was removed from the knee.  At this point, we attended to the medial-based arthroplasty.  At this point, I created a medial arthrotomy from the proximal pole of the patella down to the tibial tubercle.  Minimal  soft tissue dissection carried out per protocol.  Partial meniscectomy carried out.  Now, the knee was exposed, I sized the medial femur to be an extra-small femur. Next, medullary guide was then attached to the extra-small sizing spoon with size 4 G clamp.  Reciprocating and oscillating saws were used to create a proximal tibial osteotomy.  At this point, the size of the cut to be best fit with a double A tibial tray with retractors removed and the double A tibial tray in place, the size 4 Feeler gauge had appropriate tension at 90 degrees of flexion on the medial collateral ligament.  Once this was determined, attention was now directed to the femur.  The femoral canal was opened with a drill and then a starting awl.  Then, intramedullary rod was then passed.  At this point, the extra-small posterior cutting block guide was positioned on the proximal tibial cut and attached to the intramedullary rod using the angle device.  Once positioned into the middle portion of the medial femoral condyle, the drill holes were made for the posterior cutting block.  The cutting block guide was removed.  The posterior cutting block was then tamped to the distal femur and the posterior cut on the femur made.  At this point, based on the initial assessment of the ligaments at 90 degrees of flexion in experience with this procedure, I selected the size 4 spigot.  The distal femur was then milled.  Excessive bone was removed from the medial aspect of the joint including osteophytes as well as the distal aspect of the femur.  I was able at this point to perform further meniscectomy.  At this point, a trial reduction was carried out with an extra-small femur, the double A tibial tray.  Here, I found that at 90 and 20 degrees of extension, the knee had symmetric tension on the medial collateral ligament.  Given these findings, the trial components were removed.  Final components were selected.  Final  preparation of the tibia and femur were carried out per protocol including creating the trough for the keeled component as well as assessing for posterior femoral osteophytes and milling the anterior aspect of the distal femur. Followup trial reduction was carried out with the keeled medial right double A tibial tray and extra-small femur.  At this point, using the lollipop and a trial insert, I found that the ligament stability remained stable both at 90 and 20 degrees.  At this point, the trial components were removed.  We drilled the sclerotic bone.  The knee was irrigated with normal saline solution. The synovial capsule junction of the knee was injected with 30 mL of 0.25% Marcaine with epinephrine and 1 mL of Toradol and 30 mL of normal saline.  Once the components were opened and cement mixed, the final components were cemented into place with a single batch technique.  The knee was held at 45 degrees of  flexion while the cement cured.  Once the cement cured, excessive cement was removed from the knee.  There was no visualized cement throughout the knee based on inspection.  Once I was satisfied with this, I re-trialed and confirmed the selection of the size 4 insert.  The size 4 insert was opened and then snapped into place.  The knee had been irrigated throughout the case and again at this point.  The extensor mechanism was reapproximated using the combination of #1 Vicryl and 0 Stratafix suture.  The remainder of the wound was closed with 2-0 Vicryl and running 4-0 Monocryl.  The inferolateral portal site used from arthroscopic standpoint was reapproximated using a 4-0 nylon. The primary incision was closed with surgical glue and an Aquacel dressing.  The knee was then wrapped in sterile bulky Jones dressing. She was then brought to the recovery room in stable condition tolerating the procedure well.  Findings were reviewed with family.  She anticipates being discharged home  today with outpatient physical therapy already established.     Pietro Cassis Alvan Dame, M.D.     MDO/MEDQ  D:  05/18/2017  T:  05/18/2017  Job:  [id number]

## 2017-05-18 NOTE — Anesthesia Procedure Notes (Signed)
Spinal  Patient location during procedure: OR Start time: 05/18/2017 10:05 AM End time: 05/18/2017 10:10 AM Staffing Anesthesiologist: Lyn Hollingshead, MD Resident/CRNA: Claudia Desanctis, CRNA Performed: resident/CRNA  Preanesthetic Checklist Completed: patient identified, site marked, surgical consent, pre-op evaluation, timeout performed, IV checked, risks and benefits discussed and monitors and equipment checked Spinal Block Patient position: sitting Prep: DuraPrep Patient monitoring: heart rate, continuous pulse ox and blood pressure Approach: right paramedian Location: L3-4 Injection technique: single-shot Needle Needle type: Pencan  Needle gauge: 24 G Needle length: 10 cm Needle insertion depth: 8 cm Assessment Sensory level: T6

## 2017-05-18 NOTE — Transfer of Care (Signed)
Immediate Anesthesia Transfer of Care Note  Patient: Debra Matthews  Procedure(s) Performed: RIGHT MEDIAL PARTIAL KNEE REPLACEMENT (Right Knee) KNEE ARTHROSCOPY WITH PARTIAL LATERAL MENISCECTOMY (Right Knee)  Patient Location: PACU  Anesthesia Type:Regional and Spinal  Level of Consciousness: awake, alert  and oriented  Airway & Oxygen Therapy: Patient Spontanous Breathing and Patient connected to face mask oxygen  Post-op Assessment: Report given to RN and Post -op Vital signs reviewed and stable  Post vital signs: Reviewed and stable  Last Vitals:  Vitals:   05/18/17 0915 05/18/17 0930  BP: (P) 127/71 113/67  Pulse: 67 72  Resp: 14 14  Temp:    SpO2: 100% 100%    Last Pain:  Vitals:   05/18/17 0721  TempSrc:   PainSc: 4       Patients Stated Pain Goal: 4 (76/28/31 5176)  Complications: No apparent anesthesia complications

## 2017-05-18 NOTE — Anesthesia Procedure Notes (Addendum)
Anesthesia Regional Block: Adductor canal block   Pre-Anesthetic Checklist: ,, timeout performed, Correct Patient, Correct Site, Correct Laterality, Correct Procedure, Correct Position, site marked, Risks and benefits discussed,  Surgical consent,  Pre-op evaluation,  At surgeon's request and post-op pain management  Laterality: Right and Lower  Prep: chloraprep       Needles:  Injection technique: Single-shot  Needle Type: Echogenic Stimulator Needle     Needle Length: 9cm  Needle Gauge: 21   Needle insertion depth: 4 cm   Additional Needles:   Procedures:,,,, ultrasound used (permanent image in chart),,,,  Narrative:  Start time: 05/18/2017 8:20 AM End time: 05/18/2017 8:27 AM Injection made incrementally with aspirations every 5 mL.  Performed by: Personally  Anesthesiologist: Lyn Hollingshead, MD

## 2017-05-18 NOTE — Discharge Instructions (Addendum)

## 2017-05-18 NOTE — Progress Notes (Signed)
Report given to [facility] on [place].

## 2017-05-18 NOTE — Interval H&P Note (Signed)
History and Physical Interval Note:  05/18/2017 8:54 AM  Debra Matthews  has presented today for surgery, with the diagnosis of Right knee osteoarthritis  The various methods of treatment have been discussed with the patient and family. After consideration of risks, benefits and other options for treatment, the patient has consented to  Procedure(s) with comments: RIGHT MEDIAL PARTIAL KNEE REPLACEMENT (Right) - 90 mins as a surgical intervention .  The patient's history has been reviewed, patient examined, no change in status, stable for surgery.  I have reviewed the patient's chart and labs.  Questions were answered to the patient's satisfaction.     Mauri Pole

## 2017-05-18 NOTE — Brief Op Note (Signed)
05/18/2017  11:39 AM  PATIENT:  Debra Matthews  61 y.o. female  PRE-OPERATIVE DIAGNOSIS:  Right knee osteoarthritis associated with possible lateral meniscal tear  POST-OPERATIVE DIAGNOSIS:  1. Right knee medial compartment osteoarthritis, 2. Right knee lateral meniscal tear  PROCEDURE:  Procedure(s) with comments: 1.  RIGHT MEDIAL PARTIAL KNEE REPLACEMENT (Right) -   2.  KNEE ARTHROSCOPY WITH PARTIAL LATERAL MENISCECTOMY (Right) -  SURGEON:  Surgeon(s) and Role:    Paralee Cancel, MD - Primary  PHYSICIAN ASSISTANT: Danae Orleans, PA-C  ANESTHESIA:   regional and spinal  EBL:  25 mL   BLOOD ADMINISTERED:none  DRAINS: none   LOCAL MEDICATIONS USED:  MARCAINE     SPECIMEN:  No Specimen  DISPOSITION OF SPECIMEN:  N/A  COUNTS:  YES  TOURNIQUET:   Total Tourniquet Time Documented: Thigh (Right) - 38 minutes Total: Thigh (Right) - 38 minutes   DICTATION: .Other Dictation: Dictation Number [id number]  PLAN OF CARE: Discharge to home after PACU  PATIENT DISPOSITION:  PACU - hemodynamically stable.   Delay start of Pharmacological VTE agent (>24hrs) due to surgical blood loss or risk of bleeding: no

## 2017-05-18 NOTE — Progress Notes (Signed)
Patient's has poor motor movement after spinal for right medial unicompartmental arthroplasty. Her husband is reluctant to take her home as it is 4 steps up into their house.  Called [place] PA. Orders will be placed after Catalina Antigua is finished with procedure he is involved with.

## 2017-05-19 ENCOUNTER — Other Ambulatory Visit: Payer: Self-pay

## 2017-05-19 ENCOUNTER — Encounter (HOSPITAL_COMMUNITY): Payer: Self-pay | Admitting: Orthopedic Surgery

## 2017-05-19 DIAGNOSIS — M1711 Unilateral primary osteoarthritis, right knee: Secondary | ICD-10-CM | POA: Diagnosis not present

## 2017-05-19 LAB — CBC
HCT: 36.6 % (ref 36.0–46.0)
HEMOGLOBIN: 11.9 g/dL — AB (ref 12.0–15.0)
MCH: 30.8 pg (ref 26.0–34.0)
MCHC: 32.5 g/dL (ref 30.0–36.0)
MCV: 94.8 fL (ref 78.0–100.0)
PLATELETS: 172 10*3/uL (ref 150–400)
RBC: 3.86 MIL/uL — AB (ref 3.87–5.11)
RDW: 12.8 % (ref 11.5–15.5)
WBC: 9.7 10*3/uL (ref 4.0–10.5)

## 2017-05-19 LAB — BASIC METABOLIC PANEL
Anion gap: 7 (ref 5–15)
BUN: 10 mg/dL (ref 6–20)
CHLORIDE: 108 mmol/L (ref 101–111)
CO2: 23 mmol/L (ref 22–32)
CREATININE: 0.62 mg/dL (ref 0.44–1.00)
Calcium: 8.5 mg/dL — ABNORMAL LOW (ref 8.9–10.3)
Glucose, Bld: 121 mg/dL — ABNORMAL HIGH (ref 65–99)
POTASSIUM: 3.8 mmol/L (ref 3.5–5.1)
SODIUM: 138 mmol/L (ref 135–145)

## 2017-05-19 MED ORDER — INFLUENZA VAC SPLIT QUAD 0.5 ML IM SUSY
0.5000 mL | PREFILLED_SYRINGE | INTRAMUSCULAR | Status: AC
  Administered 2017-05-19: 0.5 mL via INTRAMUSCULAR
  Filled 2017-05-19: qty 0.5

## 2017-05-19 NOTE — Progress Notes (Signed)
Discharge planning, no HH needs identified. Plan for OP PT, has RW but needs 3n1. Contacted [facility] to deliver to room. [phone] 

## 2017-05-19 NOTE — Evaluation (Addendum)
Physical Therapy Evaluation Patient Details Name: Debra Matthews MRN: [id number] DOB: May 08, 1956 Today's Date: 05/19/2017   History of Present Illness  Pt is a 61 year old female s/p RIGHT MEDIAL PARTIAL KNEE REPLACEMENT and KNEE ARTHROSCOPY WITH PARTIAL LATERAL MENISCECTOMY (Right). PMH: subchondral insufficiency fracture right femoral condyle 07/15/16  Clinical Impression  Patient is s/p above surgery resulting in functional limitations due to the deficits listed below (see PT Problem List).  Patient will benefit from skilled PT to increase their independence and safety with mobility to allow discharge to the venue listed below.  Pt with inability to support weight through R LE so requested KI order from RN.  Pt educated on LE exercises and provided with HEP handout.  Will return to practice safe stair technique this afternoon with spouse present.     Follow Up Recommendations Outpatient PT;DC plan and follow up therapy as arranged by surgeon    Equipment Recommendations  None recommended by PT    Recommendations for Other Services       Precautions / Restrictions Precautions Precautions: Fall;Knee Precaution Comments: right knee with buckling throughout session, requested RN ask about KI for pt safety Restrictions Weight Bearing Restrictions: No Other Position/Activity Restrictions: WBAT      Mobility  Bed Mobility Overal bed mobility: Needs Assistance Bed Mobility: Supine to Sit     Supine to sit: Min guard     General bed mobility comments: pt up in recliner  Transfers Overall transfer level: Needs assistance Equipment used: Rolling walker (2 wheeled) Transfers: Sit to/from Stand Sit to Stand: Min assist         General transfer comment: verbal cues for UE and LE positioning, difficulty with rise from low surface  Ambulation/Gait Ambulation/Gait assistance: Min guard Ambulation Distance (Feet): 120 Feet Assistive device: Rolling walker (2 wheeled) Gait  Pattern/deviations: Step-to pattern;Decreased stance time - right;Antalgic     General Gait Details: pt unable to support weight through R LE, encouraged use of UEs through RW, R knee unable to extend in stance, decreased heel strike, min/guard for safety  Stairs            Wheelchair Mobility    Modified Rankin (Stroke Patients Only)       Balance                                             Pertinent Vitals/Pain Pain Assessment: 0-10 Pain Score: 4  Pain Location: right knee Pain Descriptors / Indicators: Aching;Sore Pain Intervention(s): Limited activity within patient's tolerance;Repositioned;Monitored during session;Ice applied    Home Living Family/patient expects to be discharged to:: Private residence Living Arrangements: Spouse/significant other Available Help at Discharge: Family Type of Home: House Home Access: Stairs to enter Entrance Stairs-Rails: Right Entrance Stairs-Number of Steps: 3   Home Equipment: Walker - 2 wheels;Crutches;Shower seat - built in;Grab bars - tub/shower Additional Comments: Takes care of 8 miniature horses    Prior Function Level of Independence: Independent         Comments: was NWB for several weeks with subchondral insufficiency fracture right femoral condyle in January 2018     Hand Dominance   Dominant Hand: Right    Extremity/Trunk Assessment   Upper Extremity Assessment Upper Extremity Assessment: Overall WFL for tasks assessed    Lower Extremity Assessment Lower Extremity Assessment: RLE deficits/detail RLE Deficits / Details: unable to perform  SLR, pt reports decreased sensation lower leg, fair quad contraction, R knee AAROM flexion 85*    Cervical / Trunk Assessment Cervical / Trunk Assessment: Normal  Communication   Communication: No difficulties  Cognition Arousal/Alertness: Awake/alert Behavior During Therapy: WFL for tasks assessed/performed Overall Cognitive Status: Within  Functional Limits for tasks assessed                                 General Comments: needs cues to slow down and focus on balance/knee buckling      General Comments      Exercises Total Joint Exercises Ankle Circles/Pumps: AROM;10 reps;Both Quad Sets: AROM;Right;10 reps Short Arc QuadSinclair Ship;Right;10 reps Heel Slides: AAROM;Right;10 reps Hip ABduction/ADduction: AAROM;Right;10 reps Straight Leg Raises: AAROM;Right;10 reps   Assessment/Plan    PT Assessment Patient needs continued PT services  PT Problem List Decreased strength;Decreased mobility;Decreased range of motion;Decreased knowledge of use of DME;Decreased knowledge of precautions;Pain       PT Treatment Interventions Stair training;Gait training;DME instruction;Therapeutic activities;Therapeutic exercise;Patient/family education;Functional mobility training    PT Goals (Current goals can be found in the Care Plan section)  Acute Rehab PT Goals Patient Stated Goal: regain independence PT Goal Formulation: With patient Time For Goal Achievement: 05/23/17 Potential to Achieve Goals: Good    Frequency 7X/week   Barriers to discharge        Co-evaluation               AM-PAC PT "6 Clicks" Daily Activity  Outcome Measure Difficulty turning over in bed (including adjusting bedclothes, sheets and blankets)?: None Difficulty moving from lying on back to sitting on the side of the bed? : A Little Difficulty sitting down on and standing up from a chair with arms (e.g., wheelchair, bedside commode, etc,.)?: A Little Help needed moving to and from a bed to chair (including a wheelchair)?: A Little Help needed walking in hospital room?: A Little Help needed climbing 3-5 steps with a railing? : A Lot 6 Click Score: 18    End of Session Equipment Utilized During Treatment: Gait belt Activity Tolerance: Patient tolerated treatment well Patient left: in chair;with call bell/phone within reach;with  chair alarm set Nurse Communication: Mobility status PT Visit Diagnosis: Other abnormalities of gait and mobility (R26.89)    Time: 6712-4580 PT Time Calculation (min) (ACUTE ONLY): 21 min   Charges:   PT Evaluation $PT Eval Low Complexity: 1 Low     PT G Codes:   PT G-Codes **NOT FOR INPATIENT CLASS** Functional Assessment Tool Used: AM-PAC 6 Clicks Basic Mobility;Clinical judgement Functional Limitation: Mobility: Walking and moving around Mobility: Walking and Moving Around Current Status (D9833): At least 20 percent but less than 40 percent impaired, limited or restricted Mobility: Walking and Moving Around Goal Status [phone]): At least 1 percent but less than 20 percent impaired, limited or restricted    Carmelia Bake, PT, DPT 05/19/2017 Pager: [phone]   York Ram E 05/19/2017, 11:34 AM

## 2017-05-19 NOTE — Progress Notes (Signed)
Physical Therapy Treatment Patient Details Name: Debra Matthews MRN: [id number] DOB: 1956/05/17 Today's Date: 05/19/2017    History of Present Illness Pt is a 61 year old female s/p RIGHT MEDIAL PARTIAL KNEE REPLACEMENT and KNEE ARTHROSCOPY WITH PARTIAL LATERAL MENISCECTOMY (Right). PMH: subchondral insufficiency fracture right femoral condyle 07/15/16    PT Comments    Pt educated on using KI.  Pt and spouse practiced safe stair technique.  Pt had no further questions and feels ready for d/c home today.  Follow Up Recommendations  Outpatient PT;DC plan and follow up therapy as arranged by surgeon     Equipment Recommendations  None recommended by PT    Recommendations for Other Services       Precautions / Restrictions Precautions Precautions: Fall;Knee Precaution Comments: educated pt on KI use Required Braces or Orthoses: Knee Immobilizer - Right Knee Immobilizer - Right: On when out of bed or walking Restrictions Other Position/Activity Restrictions: WBAT    Mobility  Bed Mobility               General bed mobility comments: pt up in recliner  Transfers Overall transfer level: Needs assistance Equipment used: Rolling walker (2 wheeled) Transfers: Sit to/from Stand Sit to Stand: Min guard         General transfer comment: verbal cues for UE and LE positioning  Ambulation/Gait Ambulation/Gait assistance: Min guard Ambulation Distance (Feet): 40 Feet Assistive device: Rolling walker (2 wheeled) Gait Pattern/deviations: Step-to pattern;Decreased stance time - right;Antalgic     General Gait Details: pt reports KI improving R LE weight bearing, verbal cues for sequence, RW positioning   Stairs Stairs: Yes   Stair Management: Step to pattern;Backwards;With walker Number of Stairs: 2 General stair comments: verbal cues for sequence, RW positioning and safety, spouse present and assisted with RW, pt and spouse reports understanding  Wheelchair Mobility     Modified Rankin (Stroke Patients Only)       Balance                                            Cognition Arousal/Alertness: Awake/alert Behavior During Therapy: WFL for tasks assessed/performed Overall Cognitive Status: Within Functional Limits for tasks assessed                                        Exercises      General Comments        Pertinent Vitals/Pain Pain Assessment: 0-10 Pain Score: 3  Pain Location: right knee Pain Descriptors / Indicators: Aching;Sore Pain Intervention(s): Repositioned;Limited activity within patient's tolerance;Monitored during session    Home Living                      Prior Function            PT Goals (current goals can now be found in the care plan section) Progress towards PT goals: Progressing toward goals    Frequency    7X/week      PT Plan Current plan remains appropriate    Co-evaluation              AM-PAC PT "6 Clicks" Daily Activity  Outcome Measure  Difficulty turning over in bed (including adjusting bedclothes, sheets and blankets)?: None Difficulty moving from lying  on back to sitting on the side of the bed? : A Little Difficulty sitting down on and standing up from a chair with arms (e.g., wheelchair, bedside commode, etc,.)?: A Little Help needed moving to and from a bed to chair (including a wheelchair)?: A Little Help needed walking in hospital room?: A Little Help needed climbing 3-5 steps with a railing? : A Little 6 Click Score: 19    End of Session Equipment Utilized During Treatment: Right knee immobilizer Activity Tolerance: Patient tolerated treatment well Patient left: in chair;with family/visitor present Nurse Communication: Mobility status PT Visit Diagnosis: Other abnormalities of gait and mobility (R26.89)     Time: 1356-1410 PT Time Calculation (min) (ACUTE ONLY): 14 min  Charges:  $Gait Training: 8-22 mins                     G Codes:      Carmelia Bake, PT, DPT 05/19/2017 Pager: [phone]  York Ram E 05/19/2017, 4:03 PM

## 2017-05-19 NOTE — Progress Notes (Signed)
Patient ID: Debra Matthews, female   DOB: [birth year], 60 y.o.   MRN: [id number] Subjective: 1 Day Post-Op Procedure(s) (LRB): RIGHT MEDIAL PARTIAL KNEE REPLACEMENT (Right) KNEE ARTHROSCOPY WITH PARTIAL LATERAL MENISCECTOMY (Right)    Patient reports pain as mild.  Not able to sleep much last night.  Stayed over night due to prolonged anesthesia effects.  No problems this am  Objective:   VITALS:   Vitals:   05/19/17 0200 05/19/17 0556  BP: 116/61 115/70  Pulse: 81 69  Resp: 14 14  Temp: 98.4 F (36.9 C) 97.9 F (36.6 C)  SpO2: 99% 98%    Neurovascular intact Incision: dressing C/D/I  LABS Recent Labs    05/19/17 0534  HGB 11.9*  HCT 36.6  WBC 9.7  PLT 172    Recent Labs    05/19/17 0534  NA 138  K 3.8  BUN 10  CREATININE 0.62  GLUCOSE 121*    No results for input(s): LABPT, INR in the last 72 hours.   Assessment/Plan: 1 Day Post-Op Procedure(s) (LRB): RIGHT MEDIAL PARTIAL KNEE REPLACEMENT (Right) KNEE ARTHROSCOPY WITH PARTIAL LATERAL MENISCECTOMY (Right)   Advance diet Up with therapy   Home today after therapy outpt PT already set up RTC in 2 weeks

## 2017-05-19 NOTE — Progress Notes (Signed)
Occupational Therapy Evaluation Patient Details Name: QUANTISHA MARSICANO MRN: [id number] DOB: 1955-10-25 Today's Date: 05/19/2017    History of Present Illness s/p RIGHT MEDIAL PARTIAL KNEE REPLACEMENT and KNEE ARTHROSCOPY WITH PARTIAL LATERAL MENISCECTOMY (Right). PMH: subchondral insufficiency fracture right femoral condyle 07/15/16   Clinical Impression   All OT education completed and patient questions answered. Patient is at high risk for falling due to right knee buckling during mobility tasks. Recommend 24/7 supervision/assistance by husband until that resolves. OT will sign off.    Follow Up Recommendations  No OT follow up;Supervision/Assistance - 24 hour    Equipment Recommendations  3 in 1 bedside commode    Recommendations for Other Services PT consult     Precautions / Restrictions Precautions Precautions: Fall;Knee Precaution Comments: right knee with buckling throughout session Restrictions Weight Bearing Restrictions: No Other Position/Activity Restrictions: WBAT      Mobility Bed Mobility Overal bed mobility: Needs Assistance Bed Mobility: Supine to Sit     Supine to sit: Min guard        Transfers Overall transfer level: Needs assistance Equipment used: Rolling walker (2 wheeled) Transfers: Sit to/from Stand Sit to Stand: Min assist;From elevated surface         General transfer comment: right knee buckling during sit to/from stand    Balance                                           ADL either performed or assessed with clinical judgement   ADL Overall ADL's : Needs assistance/impaired Eating/Feeding: Independent;Sitting   Grooming: Wash/dry hands;Wash/dry face;Oral care;Set up;Sitting Grooming Details (indicate cue type and reason): sat in chair at sink due to knee buckling Upper Body Bathing: Set up;Sitting   Lower Body Bathing: Minimal assistance;Sit to/from stand   Upper Body Dressing : Set up;Sitting   Lower Body  Dressing: Minimal assistance;Sit to/from stand Lower Body Dressing Details (indicate cue type and reason): needed safety cues due to knee buckling Toilet Transfer: Minimal assistance;Ambulation;Comfort height toilet;Grab bars;RW   Toileting- Clothing Manipulation and Hygiene: Minimal assistance;Sit to/from stand Toileting - Clothing Manipulation Details (indicate cue type and reason): for balance during toilet hygiene/clothing management due to knee buckling Tub/ Shower Transfer: Walk-in shower;Minimal assistance;Cueing for safety;Ambulation;Shower seat;Grab Engineer, agricultural Details (indicate cue type and reason): educated patient that husband had to be present for shower transfer due to knee buckling Functional mobility during ADLs: Minimal assistance;Rolling walker;Cueing for safety General ADL Comments: Knee buckling throughout session and patient occasionally impulsive with mobility tasks. Needs safety cues to slow down. Education on patient's increased fall risk due to knee buckling. Patient verbalized understanding. Patient also verbalized understanding that husband needed to be present for mobility tasks at this time for safety reasons.     Vision         Perception     Praxis      Pertinent Vitals/Pain Pain Assessment: 0-10 Pain Score: 4  Pain Location: right knee Pain Descriptors / Indicators: Aching;Sore Pain Intervention(s): Limited activity within patient's tolerance;Monitored during session;Ice applied     Hand Dominance Right   Extremity/Trunk Assessment Upper Extremity Assessment Upper Extremity Assessment: Overall WFL for tasks assessed   Lower Extremity Assessment Lower Extremity Assessment: Defer to PT evaluation   Cervical / Trunk Assessment Cervical / Trunk Assessment: Normal   Communication Communication Communication: No difficulties   Cognition Arousal/Alertness: Awake/alert  Behavior During Therapy: [facility] for tasks  assessed/performed;Impulsive Overall Cognitive Status: Within Functional Limits for tasks assessed                                 General Comments: needs cues to slow down and focus on balance/knee buckling   General Comments       Exercises     Shoulder Instructions      Home Living Family/patient expects to be discharged to:: Private residence Living Arrangements: Spouse/significant other Available Help at Discharge: Family Type of Home: House             Bathroom Shower/Tub: Walk-in Corporate treasurer Toilet: Standard Bathroom Accessibility: Yes How Accessible: Accessible via walker Home Equipment: Walker - 2 wheels;Crutches;Shower seat - built in;Grab bars - tub/shower   Additional Comments: Takes care of 8 miniature horses      Prior Functioning/Environment Level of Independence: Independent        Comments: was NWB for several weeks with subchondral insufficiency fracture right femoral condyle in January 2018        OT Problem List: Decreased strength;Decreased range of motion;Impaired balance (sitting and/or standing);Decreased knowledge of use of DME or AE;Decreased safety awareness;Pain      OT Treatment/Interventions:      OT Goals(Current goals can be found in the care plan section) Acute Rehab OT Goals Patient Stated Goal: regain independence OT Goal Formulation: All assessment and education complete, DC therapy  OT Frequency:     Barriers to D/C:            Co-evaluation              AM-PAC PT "6 Clicks" Daily Activity     Outcome Measure Help from another person eating meals?: None Help from another person taking care of personal grooming?: A Little Help from another person toileting, which includes using toliet, bedpan, or urinal?: A Little Help from another person bathing (including washing, rinsing, drying)?: A Little Help from another person to put on and taking off regular upper body clothing?: None Help from  another person to put on and taking off regular lower body clothing?: A Little 6 Click Score: 20   End of Session Equipment Utilized During Treatment: Rolling walker Nurse Communication: Mobility status  Activity Tolerance: Patient tolerated treatment well Patient left: in chair;with call bell/phone within reach  OT Visit Diagnosis: Unsteadiness on feet (R26.81);Muscle weakness (generalized) (M62.81)                Time: 0630-1601 OT Time Calculation (min): 26 min Charges:  OT General Charges $OT Visit: 1 Visit OT Evaluation $OT Eval Low Complexity: 1 Low OT Treatments $Self Care/Home Management : 8-22 mins G-Codes: OT G-codes **NOT FOR INPATIENT CLASS** Functional Assessment Tool Used: Clinical judgement Functional Limitation: Self care Self Care Current Status (U9323): At least 20 percent but less than 40 percent impaired, limited or restricted Self Care Goal Status (F5732): At least 20 percent but less than 40 percent impaired, limited or restricted Self Care Discharge Status [phone]): At least 20 percent but less than 40 percent impaired, limited or restricted     Tharon Bomar A Gavino Fouch 05/19/2017, 9:27 AM

## 2017-05-19 NOTE — Plan of Care (Signed)
Plan of care reviewed with patient.  Verbalizes u/o same. Denies questions at this time.

## 2017-05-20 DIAGNOSIS — Z96651 Presence of right artificial knee joint: Secondary | ICD-10-CM | POA: Diagnosis not present

## 2017-05-20 DIAGNOSIS — M25561 Pain in right knee: Secondary | ICD-10-CM | POA: Diagnosis not present

## 2017-05-20 DIAGNOSIS — M25461 Effusion, right knee: Secondary | ICD-10-CM | POA: Diagnosis not present

## 2017-05-21 DIAGNOSIS — M25461 Effusion, right knee: Secondary | ICD-10-CM | POA: Diagnosis not present

## 2017-05-21 DIAGNOSIS — Z96651 Presence of right artificial knee joint: Secondary | ICD-10-CM | POA: Diagnosis not present

## 2017-05-21 DIAGNOSIS — M25561 Pain in right knee: Secondary | ICD-10-CM | POA: Diagnosis not present

## 2017-05-22 DIAGNOSIS — M25461 Effusion, right knee: Secondary | ICD-10-CM | POA: Diagnosis not present

## 2017-05-22 DIAGNOSIS — Z96651 Presence of right artificial knee joint: Secondary | ICD-10-CM | POA: Diagnosis not present

## 2017-05-22 DIAGNOSIS — M25561 Pain in right knee: Secondary | ICD-10-CM | POA: Diagnosis not present

## 2017-05-26 DIAGNOSIS — M25461 Effusion, right knee: Secondary | ICD-10-CM | POA: Diagnosis not present

## 2017-05-26 DIAGNOSIS — M25561 Pain in right knee: Secondary | ICD-10-CM | POA: Diagnosis not present

## 2017-05-26 DIAGNOSIS — Z96651 Presence of right artificial knee joint: Secondary | ICD-10-CM | POA: Diagnosis not present

## 2017-05-27 DIAGNOSIS — Z96651 Presence of right artificial knee joint: Secondary | ICD-10-CM | POA: Diagnosis not present

## 2017-05-27 DIAGNOSIS — M25461 Effusion, right knee: Secondary | ICD-10-CM | POA: Diagnosis not present

## 2017-05-27 DIAGNOSIS — M25561 Pain in right knee: Secondary | ICD-10-CM | POA: Diagnosis not present

## 2017-05-28 NOTE — Discharge Summary (Signed)
Physician Discharge Summary  Patient ID: Debra Matthews MRN: [id number] DOB/AGE: [id number] y.o.  Admit date: 05/18/2017 Discharge date: 05/19/2017   Procedures:  Procedure(s) (LRB): RIGHT MEDIAL PARTIAL KNEE REPLACEMENT (Right) KNEE ARTHROSCOPY WITH PARTIAL LATERAL MENISCECTOMY (Right)  Attending Physician:  Dr. Paralee Cancel   Admission Diagnoses:   Right knee medial compartmental primary OA /pain and lateral compartment meniscal tear  Discharge Diagnoses:  Active Problems:   S/P right unicompartmental knee replacement  Past Medical History:  Diagnosis Date  . Abdominal pain, epigastric   . Anemia    hx of iron def.  . Arthritis   . Cancer ([place])    non melanoma skin cancer  chest  . Chronic constipation    in past  . Cyst in hand    right wrist  . 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 GERD (gastroesophageal reflux disease)   . Headache    hx of   . History of chicken pox   . History of shingles   . RLS (restless legs syndrome)   . Subchondral insufficiency fracture of femoral condyle, right, initial encounter ([place]) 07/15/2016    HPI:    Debra Matthews, 61 y.o. female , has a history of pain and functional disability in the right and has failed non-surgical conservative treatments for greater than 12 weeks to include NSAID's and/or analgesics, corticosteriod injections, use of assistive devices and activity modification.  Onset of symptoms was gradual, starting 1+ years ago with rapidlly worsening course since that time. The patient noted no past surgery on the right knee(s).  Patient currently rates pain in the right knee(s) at 9 out of 10 with activity. Patient has night pain, worsening of pain with activity and weight bearing, pain that interferes with activities of daily living, pain with passive range of motion, crepitus and joint swelling.  Patient has evidence of periarticular osteophytes and joint space narrowing of the medial compartment  by imaging studies.  There is no active infection.  Risks, benefits and expectations were discussed with the patient.  Risks including but not limited to the risk of anesthesia, blood clots, nerve damage, blood vessel damage, failure of the prosthesis, infection and up to and including death.  Patient understand the risks, benefits and expectations and wishes to proceed with surgery.   PCP: Midge Minium, MD   Discharged Condition: good  Hospital Course:  Patient underwent the above stated procedure on 05/18/2017. Patient tolerated the procedure well and brought to the recovery room in good condition and subsequently to the floor.  POD #1 BP: 115/70 ; Pulse: 69 ; Temp: 97.9 F (36.6 C) ; Resp: 14 Patient reports pain as mild.  Not able to sleep much last night.  Stayed over night due to prolonged anesthesia effects.  No problems this am. Neurovascular intact and incision: dressing C/D/I.   LABS  Basename    HGB     11.9  HCT     36.6    Discharge Exam: General appearance: alert, cooperative and no distress Extremities: Homans sign is negative, no sign of DVT, no edema, redness or tenderness in the calves or thighs and no ulcers, gangrene or trophic changes  Disposition: Home with follow up in 2 weeks   Follow-up Information    Paralee Cancel, MD. Schedule an appointment as soon as possible for a visit in 2 week(s).   Specialty:  Orthopedic Surgery Contact information: [address] [place] [address] [phone]  [facility] Follow up.   Why:  [facility] Contact information: [address] [place] [address] [phone]           Discharge Instructions    Call MD / Call 911   Complete by:  As directed    If you experience chest pain or shortness of breath, CALL 911 and be transported to the hospital emergency room.  If you develope a fever above 101 F, pus (white drainage) or increased drainage or redness at the  wound, or calf pain, call your surgeon's office.   Call MD / Call 911   Complete by:  As directed    If you experience chest pain or shortness of breath, CALL 911 and be transported to the hospital emergency room.  If you develope a fever above 101 F, pus (white drainage) or increased drainage or redness at the wound, or calf pain, call your surgeon's office.   Change dressing   Complete by:  As directed    Maintain surgical dressing until follow up in the clinic. If the edges start to pull up, may reinforce with tape. If the dressing is no longer working, may remove and cover with gauze and tape, but must keep the area dry and clean.  Call with any questions or concerns.   Constipation Prevention   Complete by:  As directed    Drink plenty of fluids.  Prune juice may be helpful.  You may use a stool softener, such as Colace (over the counter) 100 mg twice a day.  Use MiraLax (over the counter) for constipation as needed.   Constipation Prevention   Complete by:  As directed    Drink plenty of fluids.  Prune juice may be helpful.  You may use a stool softener, such as Colace (over the counter) 100 mg twice a day.  Use MiraLax (over the counter) for constipation as needed.   Diet - low sodium heart healthy   Complete by:  As directed    Diet - low sodium heart healthy   Complete by:  As directed    Discharge instructions   Complete by:  As directed    Maintain surgical dressing until follow up in the clinic. If the edges start to pull up, may reinforce with tape. If the dressing is no longer working, may remove and cover with gauze and tape, but must keep the area dry and clean.  Follow up in 2 weeks at [facility]. Call with any questions or concerns.   Driving restrictions   Complete by:  As directed    No driving until further directed through office   Increase activity slowly as tolerated   Complete by:  As directed    Weight bearing as tolerated with assist device (walker,  cane, etc) as directed, use it as long as suggested by your surgeon or therapist, typically at least 4-6 weeks.   Increase activity slowly as tolerated   Complete by:  As directed    TED hose   Complete by:  As directed    Use stockings (TED hose) for 2 weeks on both leg(s).  You may remove them at night for sleeping.      Allergies as of 05/19/2017      Reactions   Pseudoephedrine Other (See Comments)   Insomnia   Prednisone    Pt feels bad, headache, no appetite.      Medication List    STOP taking these medications  acetaminophen 500 MG tablet Commonly known as:  TYLENOL   naproxen 500 MG tablet Commonly known as:  NAPROSYN   VAGIFEM 10 MCG Tabs vaginal tablet Generic drug:  Estradiol     TAKE these medications   aspirin 81 MG chewable tablet Commonly known as:  ASPIRIN CHILDRENS Chew 1 tablet (81 mg total) by mouth 2 (two) times daily.   BENEFIBER PO Take 1 scoop by mouth 2 (two) times daily as needed (constipation).   clobetasol ointment 0.05 % Commonly known as:  TEMOVATE Apply 1 application topically 2 (two) times daily as needed for rash.   docusate sodium 100 MG capsule Commonly known as:  COLACE Take 1 capsule (100 mg total) by mouth 2 (two) times daily.   ferrous sulfate 325 (65 FE) MG tablet Commonly known as:  FERROUSUL Take 1 tablet (325 mg total) by mouth 3 (three) times daily with meals.   HYDROcodone-acetaminophen 7.5-325 MG tablet Commonly known as:  NORCO Take 1-2 tablets by mouth every 4 (four) hours as needed for moderate pain or severe pain.   methocarbamol 500 MG tablet Commonly known as:  ROBAXIN Take 1 tablet (500 mg total) by mouth every 6 (six) hours as needed for muscle spasms.   polyethylene glycol packet Commonly known as:  MIRALAX / GLYCOLAX Take 17 g by mouth 2 (two) times daily.   pramipexole 1 MG tablet Commonly known as:  MIRAPEX Take 1.5 tablets (1.5 mg total) by mouth daily.   ranitidine 150 MG tablet Commonly  known as:  ZANTAC Take 300 mg daily by mouth.   SUMAtriptan 100 MG tablet Commonly known as:  IMITREX TAKE 1 TABLET BY MOUTH ONCE A DAY AS NEEDED FOR MIGRAINE HEADACHE. (DO NOT EXCEED 2 IN 24 HOURS)   tretinoin 0.025 % cream Commonly known as:  RETIN-A APPLY ON THE SKIN AT BEDTIME APPLY TO FACE AT BEDTIME AS NEEDED FOR ACNE   valACYclovir 500 MG tablet Commonly known as:  VALTREX Take 1 tablet (500 mg total) by mouth 2 (two) times daily. What changed:    when to take this  reasons to take this   zolpidem 12.5 MG CR tablet Commonly known as:  AMBIEN CR Take 1 tablet (12.5 mg total) by mouth at bedtime as needed for sleep.            Discharge Care Instructions  (From admission, onward)        Start     Ordered   05/18/17 0000  Change dressing    Comments:  Maintain surgical dressing until follow up in the clinic. If the edges start to pull up, may reinforce with tape. If the dressing is no longer working, may remove and cover with gauze and tape, but must keep the area dry and clean.  Call with any questions or concerns.   05/18/17 1202       Signed: [place]. Jaisha Villacres   PA-C  05/28/2017, 5:27 PM

## 2017-06-03 DIAGNOSIS — M25561 Pain in right knee: Secondary | ICD-10-CM | POA: Diagnosis not present

## 2017-06-03 DIAGNOSIS — M25461 Effusion, right knee: Secondary | ICD-10-CM | POA: Diagnosis not present

## 2017-06-03 DIAGNOSIS — Z96651 Presence of right artificial knee joint: Secondary | ICD-10-CM | POA: Diagnosis not present

## 2017-06-04 ENCOUNTER — Other Ambulatory Visit: Payer: Self-pay | Admitting: General Practice

## 2017-06-04 DIAGNOSIS — Z96651 Presence of right artificial knee joint: Secondary | ICD-10-CM | POA: Diagnosis not present

## 2017-06-04 DIAGNOSIS — M25461 Effusion, right knee: Secondary | ICD-10-CM | POA: Diagnosis not present

## 2017-06-04 DIAGNOSIS — M25561 Pain in right knee: Secondary | ICD-10-CM | POA: Diagnosis not present

## 2017-06-04 MED ORDER — VALACYCLOVIR HCL 500 MG PO TABS: 500.0000 mg | ORAL_TABLET | Freq: Two times a day (BID) | ORAL | 3 refills | Status: DC | PRN

## 2017-06-05 DIAGNOSIS — L570 Actinic keratosis: Secondary | ICD-10-CM | POA: Diagnosis not present

## 2017-06-08 DIAGNOSIS — M25461 Effusion, right knee: Secondary | ICD-10-CM | POA: Diagnosis not present

## 2017-06-08 DIAGNOSIS — Z96651 Presence of right artificial knee joint: Secondary | ICD-10-CM | POA: Diagnosis not present

## 2017-06-08 DIAGNOSIS — M25561 Pain in right knee: Secondary | ICD-10-CM | POA: Diagnosis not present

## 2017-06-10 DIAGNOSIS — Z96651 Presence of right artificial knee joint: Secondary | ICD-10-CM | POA: Diagnosis not present

## 2017-06-10 DIAGNOSIS — M25461 Effusion, right knee: Secondary | ICD-10-CM | POA: Diagnosis not present

## 2017-06-10 DIAGNOSIS — M25561 Pain in right knee: Secondary | ICD-10-CM | POA: Diagnosis not present

## 2017-06-11 DIAGNOSIS — M25561 Pain in right knee: Secondary | ICD-10-CM | POA: Diagnosis not present

## 2017-06-11 DIAGNOSIS — M25461 Effusion, right knee: Secondary | ICD-10-CM | POA: Diagnosis not present

## 2017-06-11 DIAGNOSIS — Z96651 Presence of right artificial knee joint: Secondary | ICD-10-CM | POA: Diagnosis not present

## 2017-06-12 ENCOUNTER — Ambulatory Visit (INDEPENDENT_AMBULATORY_CARE_PROVIDER_SITE_OTHER): Payer: 59 | Admitting: *Deleted

## 2017-06-12 DIAGNOSIS — Z96651 Presence of right artificial knee joint: Secondary | ICD-10-CM | POA: Diagnosis not present

## 2017-06-12 DIAGNOSIS — Z23 Encounter for immunization: Secondary | ICD-10-CM

## 2017-06-12 DIAGNOSIS — M25461 Effusion, right knee: Secondary | ICD-10-CM | POA: Diagnosis not present

## 2017-06-12 DIAGNOSIS — M25561 Pain in right knee: Secondary | ICD-10-CM | POA: Diagnosis not present

## 2017-06-26 ENCOUNTER — Telehealth: Payer: Self-pay | Admitting: General Practice

## 2017-06-26 MED ORDER — ALENDRONATE SODIUM 70 MG PO TABS: 70.0000 mg | ORAL_TABLET | ORAL | 11 refills | Status: DC

## 2017-06-26 NOTE — Telephone Encounter (Signed)
Medication filled to pharmacy as requested.   

## 2017-06-26 NOTE — Telephone Encounter (Signed)
Received a refill request from pharmacy for pt alendronate. This was last filled 07/27/16 with 11 refills. Please advise if pt should still be on this? I am not sure why this was removed from med list

## 2017-06-26 NOTE — Telephone Encounter (Signed)
Yes please refill 

## 2017-07-01 DIAGNOSIS — Z96651 Presence of right artificial knee joint: Secondary | ICD-10-CM | POA: Diagnosis not present

## 2017-07-01 DIAGNOSIS — Z471 Aftercare following joint replacement surgery: Secondary | ICD-10-CM | POA: Diagnosis not present

## 2017-07-03 ENCOUNTER — Other Ambulatory Visit: Payer: Self-pay | Admitting: General Practice

## 2017-07-03 MED ORDER — PRAMIPEXOLE DIHYDROCHLORIDE 1 MG PO TABS: 1.5000 mg | ORAL_TABLET | Freq: Every day | ORAL | 1 refills | Status: DC

## 2017-08-26 ENCOUNTER — Other Ambulatory Visit: Payer: Self-pay | Admitting: General Practice

## 2017-08-26 MED ORDER — VALACYCLOVIR HCL 500 MG PO TABS: 500.0000 mg | ORAL_TABLET | Freq: Two times a day (BID) | ORAL | 0 refills | Status: DC | PRN

## 2017-09-17 ENCOUNTER — Ambulatory Visit (INDEPENDENT_AMBULATORY_CARE_PROVIDER_SITE_OTHER): Payer: 59 | Admitting: Family Medicine

## 2017-09-17 DIAGNOSIS — Z23 Encounter for immunization: Secondary | ICD-10-CM | POA: Diagnosis not present

## 2017-09-17 NOTE — Progress Notes (Signed)
Gave patient 2nd Shingrix vaccine in left deltoid requested by patient.  Above order is mine.  Annye Asa, MD

## 2017-10-02 ENCOUNTER — Encounter: Payer: Self-pay | Admitting: Family Medicine

## 2017-10-06 ENCOUNTER — Encounter: Payer: Self-pay | Admitting: Family Medicine

## 2017-10-23 ENCOUNTER — Encounter: Payer: Self-pay | Admitting: Orthopaedic Surgery

## 2017-11-12 DIAGNOSIS — H524 Presbyopia: Secondary | ICD-10-CM | POA: Diagnosis not present

## 2017-11-12 DIAGNOSIS — H52223 Regular astigmatism, bilateral: Secondary | ICD-10-CM | POA: Diagnosis not present

## 2017-11-12 DIAGNOSIS — H5203 Hypermetropia, bilateral: Secondary | ICD-10-CM | POA: Diagnosis not present

## 2017-11-20 DIAGNOSIS — Z124 Encounter for screening for malignant neoplasm of cervix: Secondary | ICD-10-CM | POA: Diagnosis not present

## 2017-11-20 DIAGNOSIS — Z1231 Encounter for screening mammogram for malignant neoplasm of breast: Secondary | ICD-10-CM | POA: Diagnosis not present

## 2017-11-20 DIAGNOSIS — Z01419 Encounter for gynecological examination (general) (routine) without abnormal findings: Secondary | ICD-10-CM | POA: Diagnosis not present

## 2017-12-04 LAB — HM PAP SMEAR

## 2017-12-04 LAB — HM MAMMOGRAPHY: HM MAMMO: NORMAL (ref 0–4)

## 2017-12-11 ENCOUNTER — Encounter: Payer: Self-pay | Admitting: General Practice

## 2018-01-28 ENCOUNTER — Other Ambulatory Visit: Payer: Self-pay | Admitting: Family Medicine

## 2018-01-28 NOTE — Telephone Encounter (Signed)
Last OV 05/06/17 ok to fill?

## 2018-01-29 NOTE — Telephone Encounter (Signed)
Will refill but please have pt schedule

## 2018-03-18 DIAGNOSIS — L570 Actinic keratosis: Secondary | ICD-10-CM | POA: Diagnosis not present

## 2018-03-18 DIAGNOSIS — C44622 Squamous cell carcinoma of skin of right upper limb, including shoulder: Secondary | ICD-10-CM | POA: Diagnosis not present

## 2018-03-18 DIAGNOSIS — L7211 Pilar cyst: Secondary | ICD-10-CM | POA: Diagnosis not present

## 2018-03-18 DIAGNOSIS — L812 Freckles: Secondary | ICD-10-CM | POA: Diagnosis not present

## 2018-03-18 DIAGNOSIS — Z85828 Personal history of other malignant neoplasm of skin: Secondary | ICD-10-CM | POA: Diagnosis not present

## 2018-05-19 ENCOUNTER — Ambulatory Visit (INDEPENDENT_AMBULATORY_CARE_PROVIDER_SITE_OTHER): Payer: 59

## 2018-05-19 DIAGNOSIS — Z23 Encounter for immunization: Secondary | ICD-10-CM

## 2018-05-19 DIAGNOSIS — Z471 Aftercare following joint replacement surgery: Secondary | ICD-10-CM | POA: Diagnosis not present

## 2018-05-19 DIAGNOSIS — Z96651 Presence of right artificial knee joint: Secondary | ICD-10-CM | POA: Diagnosis not present

## 2018-06-01 ENCOUNTER — Other Ambulatory Visit: Payer: Self-pay | Admitting: Family Medicine

## 2018-06-23 ENCOUNTER — Encounter: Payer: Self-pay | Admitting: Family Medicine

## 2018-06-23 ENCOUNTER — Other Ambulatory Visit: Payer: Self-pay

## 2018-06-23 ENCOUNTER — Ambulatory Visit (INDEPENDENT_AMBULATORY_CARE_PROVIDER_SITE_OTHER): Payer: 59 | Admitting: Family Medicine

## 2018-06-23 VITALS — BP 120/80 | HR 79 | Temp 98.1°F | Resp 16 | Ht 62.0 in | Wt 143.5 lb

## 2018-06-23 DIAGNOSIS — M79672 Pain in left foot: Secondary | ICD-10-CM | POA: Diagnosis not present

## 2018-06-23 DIAGNOSIS — Z0000 Encounter for general adult medical examination without abnormal findings: Secondary | ICD-10-CM

## 2018-06-23 DIAGNOSIS — F419 Anxiety disorder, unspecified: Secondary | ICD-10-CM

## 2018-06-23 DIAGNOSIS — E785 Hyperlipidemia, unspecified: Secondary | ICD-10-CM | POA: Diagnosis not present

## 2018-06-23 DIAGNOSIS — M7711 Lateral epicondylitis, right elbow: Secondary | ICD-10-CM | POA: Diagnosis not present

## 2018-06-23 DIAGNOSIS — G5762 Lesion of plantar nerve, left lower limb: Secondary | ICD-10-CM

## 2018-06-23 LAB — CBC WITH DIFFERENTIAL/PLATELET
BASOS ABS: 0.1 10*3/uL (ref 0.0–0.1)
Basophils Relative: 1.4 % (ref 0.0–3.0)
Eosinophils Absolute: 0.5 10*3/uL (ref 0.0–0.7)
Eosinophils Relative: 8.5 % — ABNORMAL HIGH (ref 0.0–5.0)
HCT: 39.7 % (ref 36.0–46.0)
HEMOGLOBIN: 13.2 g/dL (ref 12.0–15.0)
LYMPHS ABS: 1.4 10*3/uL (ref 0.7–4.0)
Lymphocytes Relative: 25.2 % (ref 12.0–46.0)
MCHC: 33.3 g/dL (ref 30.0–36.0)
MCV: 91 fl (ref 78.0–100.0)
MONO ABS: 0.3 10*3/uL (ref 0.1–1.0)
Monocytes Relative: 5.6 % (ref 3.0–12.0)
NEUTROS PCT: 59.3 % (ref 43.0–77.0)
Neutro Abs: 3.4 10*3/uL (ref 1.4–7.7)
Platelets: 219 10*3/uL (ref 150.0–400.0)
RBC: 4.36 Mil/uL (ref 3.87–5.11)
RDW: 13.6 % (ref 11.5–15.5)
WBC: 5.6 10*3/uL (ref 4.0–10.5)

## 2018-06-23 LAB — BASIC METABOLIC PANEL
BUN: 15 mg/dL (ref 6–23)
CHLORIDE: 105 meq/L (ref 96–112)
CO2: 26 meq/L (ref 19–32)
CREATININE: 0.69 mg/dL (ref 0.40–1.20)
Calcium: 9.7 mg/dL (ref 8.4–10.5)
GFR: 91.12 mL/min (ref >60.00)
GLUCOSE: 93 mg/dL (ref 70–99)
POTASSIUM: 4.4 meq/L (ref 3.5–5.1)
Sodium: 139 mEq/L (ref 135–145)

## 2018-06-23 LAB — LIPID PANEL
CHOL/HDL RATIO: 3
CHOLESTEROL: 222 mg/dL — AB (ref 0–200)
HDL: 73.7 mg/dL (ref >39.00)
LDL Cholesterol: 119 mg/dL — ABNORMAL HIGH (ref 0–99)
NonHDL: 148.17
Triglycerides: 147 mg/dL (ref 0.0–149.0)
VLDL: 29.4 mg/dL (ref 0.0–40.0)

## 2018-06-23 LAB — HEPATIC FUNCTION PANEL
ALBUMIN: 4.5 g/dL (ref 3.5–5.2)
ALK PHOS: 61 U/L (ref 39–117)
ALT: 18 U/L (ref 0–35)
AST: 24 U/L (ref 0–37)
Bilirubin, Direct: 0.2 mg/dL (ref 0.0–0.3)
TOTAL PROTEIN: 7.4 g/dL (ref 6.0–8.3)
Total Bilirubin: 1 mg/dL (ref 0.2–1.2)

## 2018-06-23 LAB — TSH: TSH: 2.16 u[IU]/mL (ref 0.35–4.50)

## 2018-06-23 MED ORDER — FLUOXETINE HCL 10 MG PO CAPS: 10.0000 mg | ORAL_CAPSULE | Freq: Every day | ORAL | 3 refills | Status: DC

## 2018-06-23 MED ORDER — PRAMIPEXOLE DIHYDROCHLORIDE 1 MG PO TABS: 1.5000 mg | ORAL_TABLET | Freq: Every day | ORAL | 1 refills | Status: DC

## 2018-06-23 NOTE — Patient Instructions (Addendum)
Follow up in 4-6 weeks to recheck sleep/anxiety We'll notify you of your lab results and make any changes if needed We'll call you with your Ortho appt START the Fluoxetine once daily ICE the elbow Ibuprofen as needed for foot/elbow pain START Cetirizine (Zyrtec) daily to decrease drainage We cannot increase the Mirapex at this time.  I don't want to switch it b/c we're starting the Prozac and I don't like starting 2 new meds at once Continue to work on healthy diet and regular exercise- you can do it! Call with any questions or concerns Happy New Year!!

## 2018-06-23 NOTE — Progress Notes (Signed)
   Subjective:    Patient ID: Debra Matthews, female    DOB: 11/08/1955, 63 y.o.   MRN: [id number]  HPI CPE- UTD on pap, mammo, colonoscopy.  UTD on immunizations.    Review of Systems Patient reports no vision/ hearing changes, adenopathy,fever, weight change,  persistant/recurrent hoarseness , swallowing issues, chest pain, palpitations, edema, persistant/recurrent cough, hemoptysis, dyspnea (rest/exertional/paroxysmal nocturnal), gastrointestinal bleeding (melena, rectal bleeding), abdominal pain, significant heartburn, bowel changes, GU symptoms (dysuria, hematuria, incontinence), Gyn symptoms (abnormal  bleeding, pain),  syncope, focal weakness, memory loss, numbness & tingling, skin/hair/nail changes, abnormal bruising or bleeding.   + fatigue, anxiety- unable to sleep, feels like she's 'walking on glass' + R lateral epicondylitis L heel pain, morton's neuroma + RLS- not controlled on 1.5 tabs of Mirapex    Objective:   Physical Exam General Appearance:    Alert, cooperative, no distress, appears stated age  Head:    Normocephalic, without obvious abnormality, atraumatic  Eyes:    PERRL, conjunctiva/corneas clear, EOM's intact, fundi    benign, both eyes  Ears:    Normal TM's and external ear canals, both ears  Nose:   Nares normal, septum midline, mucosa normal, no drainage    or sinus tenderness  Throat:   Lips, mucosa, and tongue normal; teeth and gums normal  Neck:   Supple, symmetrical, trachea midline, no adenopathy;    Thyroid: no enlargement/tenderness/nodules  Back:     Symmetric, no curvature, ROM normal, no CVA tenderness  Lungs:     Clear to auscultation bilaterally, respirations unlabored  Chest Wall:    No tenderness or deformity   Heart:    Regular rate and rhythm, S1 and S2 normal, no murmur, rub   or gallop  Breast Exam:    Deferred to GYN  Abdomen:     Soft, non-tender, bowel sounds active all four quadrants,    no masses, no organomegaly  Genitalia:     Deferred to GYN  Rectal:    Extremities:   Extremities normal, atraumatic, no cyanosis or edema.  TTP over R lateral epicondyle  Pulses:   2+ and symmetric all extremities  Skin:   Skin color, texture, turgor normal, no rashes or lesions  Lymph nodes:   Cervical, supraclavicular, and axillary nodes normal  Neurologic:   CNII-XII intact, normal strength, sensation and reflexes    throughout          Assessment & Plan:  R lateral epicondylitis- reviewed dx and tx w/ pt.  NSAIDs, ice, counterforce strap.  If no improvement, will need ortho referral.  Pt expressed understanding and is in agreement w/ plan.   L foot pain- new.  Pt is asking for referral to ortho or podiatry.  Referral placed  Anxiety- new.  This is pt's biggest issue at this time.  She reports she is not able to sleep b/c she is unable to 'turn off my brain'.  This has been an ongoing issue for her and she has been on multiple medications in the past.  She is concerned for possible weight gain.  Will start Prozac as this is weight neutral and follow closely.

## 2018-06-24 ENCOUNTER — Encounter: Payer: Self-pay | Admitting: General Practice

## 2018-06-24 NOTE — Assessment & Plan Note (Signed)
Pt's PE WNL.  UTD on pap, mammo, colonoscopy.  UTD on immunizations.  Pt has multiple musculoskeletal complaints- she will be referred to ortho.  Check labs.  Anticipatory guidance provided.

## 2018-06-24 NOTE — Assessment & Plan Note (Addendum)
Chronic problem.  Attempting to control w/ diet and exercise.  Check labs.  Adjust meds prn

## 2018-07-01 ENCOUNTER — Ambulatory Visit (INDEPENDENT_AMBULATORY_CARE_PROVIDER_SITE_OTHER): Payer: Self-pay | Admitting: Orthopedic Surgery

## 2018-07-02 DIAGNOSIS — M76822 Posterior tibial tendinitis, left leg: Secondary | ICD-10-CM | POA: Diagnosis not present

## 2018-07-05 DIAGNOSIS — C44321 Squamous cell carcinoma of skin of nose: Secondary | ICD-10-CM | POA: Diagnosis not present

## 2018-07-05 DIAGNOSIS — C44329 Squamous cell carcinoma of skin of other parts of face: Secondary | ICD-10-CM | POA: Diagnosis not present

## 2018-07-12 ENCOUNTER — Encounter: Payer: Self-pay | Admitting: Family Medicine

## 2018-07-13 MED ORDER — FLUOXETINE HCL 20 MG PO CAPS: 20.0000 mg | ORAL_CAPSULE | Freq: Every day | ORAL | 3 refills | Status: DC

## 2018-07-21 ENCOUNTER — Ambulatory Visit: Payer: 59 | Admitting: Family Medicine

## 2018-07-28 DIAGNOSIS — M722 Plantar fascial fibromatosis: Secondary | ICD-10-CM | POA: Diagnosis not present

## 2018-07-28 DIAGNOSIS — M7672 Peroneal tendinitis, left leg: Secondary | ICD-10-CM | POA: Diagnosis not present

## 2018-07-29 DIAGNOSIS — C44321 Squamous cell carcinoma of skin of nose: Secondary | ICD-10-CM | POA: Diagnosis not present

## 2018-08-12 ENCOUNTER — Other Ambulatory Visit: Payer: Self-pay | Admitting: Family Medicine

## 2018-09-06 ENCOUNTER — Encounter (INDEPENDENT_AMBULATORY_CARE_PROVIDER_SITE_OTHER): Payer: 59 | Admitting: Family Medicine

## 2018-09-06 DIAGNOSIS — G2581 Restless legs syndrome: Secondary | ICD-10-CM | POA: Diagnosis not present

## 2018-09-07 ENCOUNTER — Encounter: Payer: Self-pay | Admitting: Family Medicine

## 2018-09-07 MED ORDER — ROPINIROLE HCL 4 MG PO TABS: 4.0000 mg | ORAL_TABLET | Freq: Every day | ORAL | 1 refills | Status: DC

## 2018-09-07 NOTE — Telephone Encounter (Signed)
Cumulative Time: 12 minutes reviewing chart, previous medications, and determining new treatment plan Consent: As noted in MyChart message above People Involved: pt, myself, and Elyn Aquas (verbal consult) CC: RLS HPI: Pt has been on high dose of Mirapex since prior to establishing care with me in 2017.  She reports this is not effective in controlling her sxs.   AP: Based on uncontrolled RLS, will switch to high dose Requip and monitor for improvement.  If no improvement, will wean Requip and start Lyrica.

## 2018-09-14 DIAGNOSIS — H2513 Age-related nuclear cataract, bilateral: Secondary | ICD-10-CM | POA: Diagnosis not present

## 2018-09-14 DIAGNOSIS — H02831 Dermatochalasis of right upper eyelid: Secondary | ICD-10-CM | POA: Diagnosis not present

## 2018-09-14 DIAGNOSIS — H43811 Vitreous degeneration, right eye: Secondary | ICD-10-CM | POA: Diagnosis not present

## 2018-09-20 ENCOUNTER — Encounter: Payer: Self-pay | Admitting: Family Medicine

## 2018-09-22 ENCOUNTER — Encounter: Payer: Self-pay | Admitting: Family Medicine

## 2018-09-22 ENCOUNTER — Other Ambulatory Visit: Payer: Self-pay | Admitting: Family Medicine

## 2018-09-22 ENCOUNTER — Ambulatory Visit (INDEPENDENT_AMBULATORY_CARE_PROVIDER_SITE_OTHER): Payer: 59 | Admitting: Family Medicine

## 2018-09-22 ENCOUNTER — Other Ambulatory Visit: Payer: Self-pay

## 2018-09-22 VITALS — BP 139/54 | Temp 97.7°F

## 2018-09-22 DIAGNOSIS — G2581 Restless legs syndrome: Secondary | ICD-10-CM | POA: Diagnosis not present

## 2018-09-22 MED ORDER — GABAPENTIN 100 MG PO CAPS: ORAL_CAPSULE | ORAL | 3 refills | Status: DC

## 2018-09-22 MED ORDER — PRAMIPEXOLE DIHYDROCHLORIDE 1 MG PO TABS: 1.0000 mg | ORAL_TABLET | Freq: Three times a day (TID) | ORAL | 1 refills | Status: DC

## 2018-09-22 NOTE — Progress Notes (Signed)
I have discussed the procedure for the virtual visit with the patient who has given consent to proceed with assessment and treatment.   Gerilyn Nestle, RN

## 2018-09-22 NOTE — Telephone Encounter (Signed)
Requested medication (s) are due for refill today: Yes  Requested medication (s) are on the active medication list: Yes  Last refill:  n/a  Future visit scheduled: Had visit today  Notes to clinic:  Pt. Had a visit today.    Requested Prescriptions  Pending Prescriptions Disp Refills   pramipexole (MIRAPEX) 1 MG tablet      Sig: Take 1 tablet (1 mg total) by mouth 3 (three) times daily.     Neurology:  Parkinsonian Agents Passed - 09/22/2018 10:35 AM      Passed - Last BP in normal range    BP Readings from Last 1 Encounters:  09/22/18 (!) 139/54         Passed - Valid encounter within last 12 months    Recent Outpatient Visits          Today RLS (restless legs syndrome)   [place] Midge Minium, MD   3 months ago Physical exam   [facility] [place] Midge Minium, MD   1 year ago Need for vaccination   [facility] [place] Midge Minium, MD   1 year ago Pre-op examination   [facility] [place] Midge Minium, MD   2 years ago [facility] [place], [place], [place]           Yes

## 2018-09-22 NOTE — Progress Notes (Signed)
Virtual Visit via Video   I connected with Debra Matthews on 09/22/18 at  8:30 AM EDT by a video enabled telemedicine application and verified that I am speaking with the correct person using two identifiers. Location patient: Home Location provider: Acup[facility]ist, Office Persons participating in the virtual visit: Pt and Myself  I discussed the limitations of evaluation and management by telemedicine and the availability of in person appointments. The patient expressed understanding and agreed to proceed.  Subjective:   HPI:  RLS- pt reports that switching from the Mirapex to the Requip worsened her sxs.  She attempted medication for a week or so before she had to quit.  Not only were sxs more intense, they were more frequent.  Pt is back to Mirapex 1 tab QAM and 2-2.5 tabs QPM.  Has not been on other medications prior to Mirapex.  ROS: See pertinent positives and negatives per HPI.  Patient Active Problem List   Diagnosis Date Noted  . S/P right unicompartmental knee replacement 05/18/2017  . Hyperlipidemia 05/06/2017  . Physical exam 08/13/2016  . Osteoporosis 08/13/2016  . Carpal tunnel syndrome, bilateral 08/13/2016  . Insomnia 08/13/2016  . Subchondral insufficiency fracture of femoral condyle, right, initial encounter ([place]) 07/15/2016  . Anxiety state 03/17/2016  . RLS (restless legs syndrome) 02/11/2016  . GERD (gastroesophageal reflux disease) 02/11/2016  . Migraines 02/11/2016    Social History   Tobacco Use  . Smoking status: Never Smoker  . Smokeless tobacco: Never Used  Substance Use Topics  . Alcohol use: No    Alcohol/week: 0.0 standard drinks    Current Outpatient Medications:  .  clobetasol ointment (TEMOVATE) 9.70 %, Apply 1 application topically 2 (two) times daily as needed for rash., Disp: , Rfl: 2 .  FLUoxetine (PROZAC) 20 MG capsule, TAKE 1 CAPSULE BY MOUTH EVERY DAY, Disp: 90 capsule, Rfl: 1 .  pramipexole (MIRAPEX) 1 MG tablet, Take 1 mg by  mouth 3 (three) times daily., Disp: , Rfl:  .  SUMAtriptan (IMITREX) 100 MG tablet, TAKE 1 TABLET BY MOUTH ONCE A DAY AS NEEDED FOR MIGRAINE HEADACHE. (DO NOT EXCEED 2 IN 24 HOURS), Disp: 30 tablet, Rfl: 3 .  tretinoin (RETIN-A) 0.025 % cream, APPLY ON THE SKIN AT BEDTIME APPLY TO FACE AT BEDTIME AS NEEDED FOR ACNE, Disp: , Rfl: 3 .  valACYclovir (VALTREX) 500 MG tablet, Take 1 tablet (500 mg total) by mouth 2 (two) times daily as needed (cold sores)., Disp: 180 tablet, Rfl: 0 .  Wheat Dextrin (BENEFIBER PO), Take 1 scoop by mouth 2 (two) times daily as needed (constipation). , Disp: , Rfl:  .  rOPINIRole (REQUIP) 4 MG tablet, Take 1 tablet (4 mg total) by mouth at bedtime. (Patient not taking: Reported on 09/22/2018), Disp: 30 tablet, Rfl: 1  Allergies  Allergen Reactions  . Pseudoephedrine Other (See Comments)    Insomnia  . Prednisone     Pt feels bad, headache, no appetite.    Objective:   BP (!) 139/54   Temp 97.7 F (36.5 C)  AAOx3, NAD NCAT, EOMI No obvious CN deficits Coloring WNL Pt is able to speak clearly, coherently without shortness of breath or increased work of breathing.  Thought process is linear.  Mood is appropriate.   Assessment and Plan:   RLS- deteriorated.  Pt reports Requip worsened her sxs but when she returned to Mirapex she increased the dose to 2-2.5mg  nightly.  She had previously been taking 1.5mg  nightly for ~8 yrs.  She reports that dose was ineffective.  Encouraged her to resume the 1.5mg  nightly dose and we will add gabapentin- titrating as needed.  Pt expressed understanding and is in agreement w/ plan.    Annye Asa, MD 09/22/2018

## 2018-09-27 DIAGNOSIS — L570 Actinic keratosis: Secondary | ICD-10-CM | POA: Diagnosis not present

## 2018-10-03 ENCOUNTER — Other Ambulatory Visit: Payer: Self-pay | Admitting: Family Medicine

## 2018-10-13 ENCOUNTER — Encounter: Payer: Self-pay | Admitting: Family Medicine

## 2018-10-13 ENCOUNTER — Ambulatory Visit (INDEPENDENT_AMBULATORY_CARE_PROVIDER_SITE_OTHER): Payer: 59 | Admitting: Family Medicine

## 2018-10-13 ENCOUNTER — Other Ambulatory Visit: Payer: Self-pay

## 2018-10-13 VITALS — BP 122/73 | HR 67 | Temp 97.0°F | Ht 62.0 in | Wt 142.0 lb

## 2018-10-13 DIAGNOSIS — R252 Cramp and spasm: Secondary | ICD-10-CM

## 2018-10-13 DIAGNOSIS — G2581 Restless legs syndrome: Secondary | ICD-10-CM

## 2018-10-13 MED ORDER — GABAPENTIN 300 MG PO CAPS: 300.0000 mg | ORAL_CAPSULE | Freq: Every day | ORAL | 1 refills | Status: DC

## 2018-10-13 NOTE — Progress Notes (Signed)
I have discussed the procedure for the virtual visit with the patient who has given consent to proceed with assessment and treatment.   BETHANY DILLARD, CMA     

## 2018-10-13 NOTE — Progress Notes (Signed)
Virtual Visit via Video   I connected with Debra Matthews on 10/13/18 at  9:00 AM EDT by a video enabled telemedicine application and verified that I am speaking with the correct person using two identifiers.  Location Debra Matthews: Home Location provider: [facility], Office Persons participating in the virtual visit: Debra Matthews, Provider, [place] ([place])  I discussed the limitations of evaluation and management by telemedicine and the availability of in person appointments. The Debra Matthews expressed understanding and agreed to proceed.  Subjective:   HPI:   RLS- pt reports sxs are much better controlled w/ addition of gabapentin.  Pt is on 300mg  nightly dose.  Continues to take 1.5 tabs Mirapex.  Sleeping better.  Leg cramps- pt has issues w/ excessive sweating and does quite a bit of yard work in the spring and summer.  Pt is having cramping that wakes her from sleep, 'it feels like my muscle is ripping'.  Only occurs after excessive sweating.    ROS:   See pertinent positives and negatives per HPI.  Debra Matthews Active Problem List   Diagnosis Date Noted  . S/P right unicompartmental knee replacement 05/18/2017  . Hyperlipidemia 05/06/2017  . Physical exam 08/13/2016  . Osteoporosis 08/13/2016  . Carpal tunnel syndrome, bilateral 08/13/2016  . Insomnia 08/13/2016  . Subchondral insufficiency fracture of femoral condyle, right, initial encounter ([place]) 07/15/2016  . Anxiety state 03/17/2016  . RLS (restless legs syndrome) 02/11/2016  . GERD (gastroesophageal reflux disease) 02/11/2016  . Migraines 02/11/2016    Social History   Tobacco Use  . Smoking status: Never Smoker  . Smokeless tobacco: Never Used  Substance Use Topics  . Alcohol use: No    Alcohol/week: 0.0 standard drinks    Current Outpatient Medications:  .  clobetasol ointment (TEMOVATE) 8.33 %, Apply 1 application topically 2 (two) times daily as needed for rash., Disp: , Rfl: 2 .  FLUoxetine (PROZAC) 20 MG  capsule, TAKE 1 CAPSULE BY MOUTH EVERY DAY, Disp: 90 capsule, Rfl: 1 .  gabapentin (NEURONTIN) 100 MG capsule, Take 1 capsule (100 mg total) by mouth at bedtime for 7 days, THEN 2 capsules (200 mg total) at bedtime for 7 days, THEN 3 capsules (300 mg total) at bedtime for 7 days., Disp: 90 capsule, Rfl: 3 .  pramipexole (MIRAPEX) 1 MG tablet, Take 1 tablet (1 mg total) by mouth 3 (three) times daily., Disp: 60 tablet, Rfl: 1 .  rOPINIRole (REQUIP) 4 MG tablet, TAKE 1 TABLET (4 MG TOTAL) BY MOUTH AT BEDTIME., Disp: 30 tablet, Rfl: 1 .  SUMAtriptan (IMITREX) 100 MG tablet, TAKE 1 TABLET BY MOUTH ONCE A DAY AS NEEDED FOR MIGRAINE HEADACHE. (DO NOT EXCEED 2 IN 24 HOURS), Disp: 30 tablet, Rfl: 3 .  tretinoin (RETIN-A) 0.025 % cream, APPLY ON THE SKIN AT BEDTIME APPLY TO FACE AT BEDTIME AS NEEDED FOR ACNE, Disp: , Rfl: 3 .  valACYclovir (VALTREX) 500 MG tablet, Take 1 tablet (500 mg total) by mouth 2 (two) times daily as needed (cold sores)., Disp: 180 tablet, Rfl: 0 .  Wheat Dextrin (BENEFIBER PO), Take 1 scoop by mouth 2 (two) times daily as needed (constipation). , Disp: , Rfl:   Allergies  Allergen Reactions  . Pseudoephedrine Other (See Comments)    Insomnia  . Prednisone     Pt feels bad, headache, no appetite.    Objective:   BP 122/73   Pulse 67   Temp (!) 97 F (36.1 C)   Ht 5\' 2"  (1.575 m)  Wt 142 lb (64.4 kg)   BMI 25.97 kg/m   AAOx3, NAD NCAT, EOMI No obvious CN deficits Coloring WNL Pt is able to speak clearly, coherently without shortness of breath or increased work of breathing.  Thought process is linear.  Mood is appropriate.   Assessment and Plan:   RLS- much improved w/ addition and titration of gabapentin.  Continue the Mirapex 1.5mg  QHS and 300mg  Gabapentin nightly.  New prescription sent.  Leg cramps- discussed repleting electrolytes after excessive sweating.  Gatorade, bananas, OJ, mustard, and tonic water were all discussed as possible txs/prophylactics for  leg cramps.  Pt expressed understanding and is in agreement w/ plan.   Annye Asa, MD 10/13/2018

## 2018-12-07 ENCOUNTER — Other Ambulatory Visit: Payer: Self-pay | Admitting: Family Medicine

## 2019-02-03 LAB — HM MAMMOGRAPHY: HM Mammogram: NORMAL (ref 0–4)

## 2019-02-03 LAB — HM PAP SMEAR

## 2019-02-04 ENCOUNTER — Encounter: Payer: Self-pay | Admitting: Emergency Medicine

## 2019-03-04 ENCOUNTER — Other Ambulatory Visit: Payer: Self-pay | Admitting: Family Medicine

## 2019-05-04 ENCOUNTER — Encounter: Payer: Self-pay | Admitting: Family Medicine

## 2019-05-06 ENCOUNTER — Encounter: Payer: Self-pay | Admitting: Family Medicine

## 2019-05-19 ENCOUNTER — Encounter: Payer: Self-pay | Admitting: Family Medicine

## 2019-05-19 ENCOUNTER — Other Ambulatory Visit: Payer: Self-pay

## 2019-05-19 ENCOUNTER — Ambulatory Visit (INDEPENDENT_AMBULATORY_CARE_PROVIDER_SITE_OTHER): Payer: 59 | Admitting: Family Medicine

## 2019-05-19 VITALS — BP 121/73 | HR 76 | Temp 97.9°F | Resp 16 | Ht 62.0 in | Wt 141.1 lb

## 2019-05-19 DIAGNOSIS — R0982 Postnasal drip: Secondary | ICD-10-CM

## 2019-05-19 DIAGNOSIS — Z23 Encounter for immunization: Secondary | ICD-10-CM | POA: Diagnosis not present

## 2019-05-19 DIAGNOSIS — F411 Generalized anxiety disorder: Secondary | ICD-10-CM | POA: Diagnosis not present

## 2019-05-19 DIAGNOSIS — G2581 Restless legs syndrome: Secondary | ICD-10-CM | POA: Diagnosis not present

## 2019-05-19 MED ORDER — PREGABALIN 150 MG PO CAPS: 150.0000 mg | ORAL_CAPSULE | Freq: Every day | ORAL | 3 refills | Status: DC

## 2019-05-19 MED ORDER — FLUOXETINE HCL 40 MG PO CAPS: 40.0000 mg | ORAL_CAPSULE | Freq: Every day | ORAL | 3 refills | Status: DC

## 2019-05-19 MED ORDER — FLUTICASONE PROPIONATE 50 MCG/ACT NA SUSP: 2.0000 | Freq: Every day | NASAL | 6 refills | Status: DC

## 2019-05-19 NOTE — Assessment & Plan Note (Signed)
Deteriorated.  Pt feels her sxs are worsening and feels that the Pramipexole is possibly the cause.  She read an article about Lyrica causing less augmentation than the Mirapex and would like to try.  Will wean her off the Mirapex and start the Lyrica.  Pt expressed understanding and is in agreement w/ plan.

## 2019-05-19 NOTE — Assessment & Plan Note (Signed)
New to provider, ongoing for pt.  On Zyrtec daily.  Will add Flonase and monitor for improvement.

## 2019-05-19 NOTE — Progress Notes (Signed)
   Subjective:    Patient ID: Debra Matthews, female    DOB: 1955-06-26, 63 y.o.   MRN: [id number]  HPI RLS- ongoing issue for pt.  On Mirapex 1.5mg  QHS.  Medication will last ~2 hrs.  At times will take extra doses throughout the day b/c 'my legs are driving me crazy'.  Is interested in trying Lyrica after reading an article.  Continues to have muscle cramps that can be severe.  Fatigue- has had recent lab work that indicates everything is fine w/ exception of mildly low Vit D.  Started OTC daily supplement.  Reports mood has been really down due to COVID.  Increased stress/anxiety, insomnia.  On Prozac 20mg  daily but doesn't notice a difference  PND- 'it's a solid wall of gunk'.  Develops frequent tonsil stones.  Taking Cetirizine daily w/o improvement.  Not using nasal spray   Review of Systems For ROS see HPI   This visit occurred during the SARS-CoV-2 public health emergency.  Safety protocols were in place, including screening questions prior to the visit, additional usage of staff PPE, and extensive cleaning of exam room while observing appropriate contact time as indicated for disinfecting solutions.       Objective:   Physical Exam Vitals signs reviewed.  Constitutional:      General: She is not in acute distress.    Appearance: Normal appearance. She is not ill-appearing.  HENT:     Head: Normocephalic and atraumatic.  Skin:    General: Skin is warm and dry.  Neurological:     General: No focal deficit present.     Mental Status: She is alert and oriented to person, place, and time.  Psychiatric:        Mood and Affect: Mood normal.        Behavior: Behavior normal.        Thought Content: Thought content normal.           Assessment & Plan:

## 2019-05-19 NOTE — Patient Instructions (Addendum)
Follow up in 1 month to recheck anxiety INCREASE the fluoxetine (Prozac) to 40mg - 2 of what you have and then 1 of the new prescription DECREASE the Pramipexole to 1 tab x1 week and then 1/2 tab x1 week START the Lyrica nightly for restless leg CONTINUE the Cetirizine daily for allergies ADD the Fluticasone nasal spray Call with any questions or concerns Stay Safe!  Stay Healthy!

## 2019-05-19 NOTE — Assessment & Plan Note (Signed)
Deteriorated.  Pt is struggling during this time of COVID.  I suspect that some of her fatigue is directly related to her mood symptoms.  Will increase Prozac to 40mg  daily and monitor for improvement.  Pt expressed understanding and is in agreement w/ plan.

## 2019-05-23 ENCOUNTER — Other Ambulatory Visit: Payer: Self-pay | Admitting: Orthopaedic Surgery

## 2019-05-23 DIAGNOSIS — M8430XA Stress fracture, unspecified site, initial encounter for fracture: Secondary | ICD-10-CM

## 2019-05-30 ENCOUNTER — Ambulatory Visit
Admission: RE | Admit: 2019-05-30 | Discharge: 2019-05-30 | Disposition: A | Discharge status: Home / Self Care | Payer: 59 | Source: Ambulatory Visit | Attending: Orthopaedic Surgery | Admitting: Orthopaedic Surgery

## 2019-05-30 DIAGNOSIS — M8430XA Stress fracture, unspecified site, initial encounter for fracture: Secondary | ICD-10-CM

## 2019-06-12 ENCOUNTER — Other Ambulatory Visit: Payer: Self-pay | Admitting: Family Medicine

## 2019-06-15 ENCOUNTER — Other Ambulatory Visit: Payer: Self-pay

## 2019-06-15 ENCOUNTER — Ambulatory Visit (INDEPENDENT_AMBULATORY_CARE_PROVIDER_SITE_OTHER): Payer: 59 | Admitting: Family Medicine

## 2019-06-15 ENCOUNTER — Encounter: Payer: Self-pay | Admitting: Family Medicine

## 2019-06-15 VITALS — BP 116/61 | HR 72 | Temp 97.8°F | Ht 62.0 in | Wt 141.0 lb

## 2019-06-15 DIAGNOSIS — F411 Generalized anxiety disorder: Secondary | ICD-10-CM

## 2019-06-15 DIAGNOSIS — G2581 Restless legs syndrome: Secondary | ICD-10-CM

## 2019-06-15 MED ORDER — PREGABALIN 300 MG PO CAPS: 300.0000 mg | ORAL_CAPSULE | Freq: Every day | ORAL | 1 refills | Status: DC

## 2019-06-15 NOTE — Progress Notes (Signed)
Virtual Visit via Video   I connected with patient on 06/15/19 at 11:30 AM EST by a video enabled telemedicine application and verified that I am speaking with the correct person using two identifiers.  Location patient: Home Location provider: [facility], Office Persons participating in the virtual visit: Patient, Provider, [place] (Jess B)  I discussed the limitations of evaluation and management by telemedicine and the availability of in person appointments. The patient expressed understanding and agreed to proceed.  Subjective:   HPI:  Anxiety- at last visit Prozac was increased to 40mg  daily.  Pt reports increased dose has helped and sxs are under better control.  RLS- at last visit we decided to wean off Mirapex and start Lyrica for sxs relief.  Pt reports as she was weaning the Mirapex and this was overlapping w/ Lyrica, she had no symptoms.  Has had some breakthrough since taking just Lyrica.  Having breakthrough 2-3x/week which is 'greatly improved'.  Pt is interested in increasing dose if possible.  ROS:   See pertinent positives and negatives per HPI.  Patient Active Problem List   Diagnosis Date Noted  . PND (post-nasal drip) 05/19/2019  . S/P right unicompartmental knee replacement 05/18/2017  . Hyperlipidemia 05/06/2017  . Physical exam 08/13/2016  . Osteoporosis 08/13/2016  . Carpal tunnel syndrome, bilateral 08/13/2016  . Insomnia 08/13/2016  . Subchondral insufficiency fracture of femoral condyle, right, initial encounter ([place]) 07/15/2016  . Anxiety state 03/17/2016  . RLS (restless legs syndrome) 02/11/2016  . GERD (gastroesophageal reflux disease) 02/11/2016  . Migraines 02/11/2016    Social History   Tobacco Use  . Smoking status: Never Smoker  . Smokeless tobacco: Never Used  Substance Use Topics  . Alcohol use: No    Alcohol/week: 0.0 standard drinks    Current Outpatient Medications:  .  FLUoxetine (PROZAC) 40 MG capsule, TAKE 1 CAPSULE  BY MOUTH EVERY DAY, Disp: 90 capsule, Rfl: 1 .  fluticasone (FLONASE) 50 MCG/ACT nasal spray, Place 2 sprays into both nostrils daily., Disp: 16 g, Rfl: 6 .  meloxicam (MOBIC) 15 MG tablet, Take 15 mg by mouth every morning., Disp: , Rfl:  .  pregabalin (LYRICA) 150 MG capsule, Take 1 capsule (150 mg total) by mouth at bedtime., Disp: 30 capsule, Rfl: 3 .  tretinoin (RETIN-A) 0.025 % cream, APPLY ON THE SKIN AT BEDTIME APPLY TO FACE AT BEDTIME AS NEEDED FOR ACNE, Disp: , Rfl: 3 .  valACYclovir (VALTREX) 500 MG tablet, Take 1 tablet (500 mg total) by mouth 2 (two) times daily as needed (cold sores)., Disp: 180 tablet, Rfl: 0 .  Wheat Dextrin (BENEFIBER PO), Take 1 scoop by mouth 2 (two) times daily as needed (constipation). , Disp: , Rfl:  .  clobetasol ointment (TEMOVATE) [id number] %, Apply 1 application topically 2 (two) times daily as needed for rash., Disp: , Rfl: 2  Allergies  Allergen Reactions  . Pseudoephedrine Other (See Comments)    Insomnia  . Prednisone     Pt feels bad, headache, no appetite.    Objective:   BP 116/61   Pulse 72   Temp 97.8 F (36.6 C) (Oral)   Ht 5\' 2"  (1.575 m)   Wt 141 lb (64 kg)   BMI 25.79 kg/m  AAOx3, NAD NCAT, EOMI No obvious CN deficits Coloring WNL Pt is able to speak clearly, coherently without shortness of breath or increased work of breathing.  Thought process is linear.  Mood is appropriate.   Assessment and Plan:  Anxiety- improved since increasing Prozac to 40mg  daily.  Pt reports feeling better.  No med changes at this time.  RLS- improved since switching to Lyrica.  Best sxs management was when she was weaning Mirapex and overlapping it w/ Lyrica.  She still has breakthrough symptoms so will increase to 300mg  nightly.  Pt expressed understanding and is in agreement w/ plan.   Annye Asa, MD 06/15/2019

## 2019-06-15 NOTE — Progress Notes (Signed)
I have discussed the procedure for the virtual visit with the patient who has given consent to proceed with assessment and treatment.   Thais Silberstein L Vernice Bowker, CMA     

## 2019-11-02 ENCOUNTER — Other Ambulatory Visit: Payer: Self-pay | Admitting: Family Medicine

## 2019-11-02 NOTE — Telephone Encounter (Signed)
Please advise, this was notated as having a change in therapy.

## 2019-11-18 ENCOUNTER — Encounter: Payer: Self-pay | Admitting: Family Medicine

## 2019-11-18 ENCOUNTER — Other Ambulatory Visit: Payer: Self-pay

## 2019-11-18 ENCOUNTER — Ambulatory Visit (INDEPENDENT_AMBULATORY_CARE_PROVIDER_SITE_OTHER): Payer: Medicare Other | Admitting: Family Medicine

## 2019-11-18 VITALS — BP 122/83 | HR 74 | Temp 98.0°F | Resp 16 | Ht 62.0 in | Wt 147.1 lb

## 2019-11-18 DIAGNOSIS — R202 Paresthesia of skin: Secondary | ICD-10-CM | POA: Diagnosis not present

## 2019-11-18 DIAGNOSIS — R5383 Other fatigue: Secondary | ICD-10-CM

## 2019-11-18 DIAGNOSIS — G2581 Restless legs syndrome: Secondary | ICD-10-CM | POA: Diagnosis not present

## 2019-11-18 DIAGNOSIS — E559 Vitamin D deficiency, unspecified: Secondary | ICD-10-CM | POA: Diagnosis not present

## 2019-11-18 DIAGNOSIS — E663 Overweight: Secondary | ICD-10-CM

## 2019-11-18 LAB — B12 AND FOLATE PANEL
Folate: 17.2 ng/mL (ref >5.9)
Vitamin B-12: 283 pg/mL (ref 211–911)

## 2019-11-18 LAB — CBC WITH DIFFERENTIAL/PLATELET
Basophils Absolute: 0.1 10*3/uL (ref 0.0–0.1)
Basophils Relative: 1.2 % (ref 0.0–3.0)
Eosinophils Absolute: 0.1 10*3/uL (ref 0.0–0.7)
Eosinophils Relative: 1.5 % (ref 0.0–5.0)
HCT: 31.9 % — ABNORMAL LOW (ref 36.0–46.0)
Hemoglobin: 10.7 g/dL — ABNORMAL LOW (ref 12.0–15.0)
Lymphocytes Relative: 37.7 % (ref 12.0–46.0)
Lymphs Abs: 1.7 10*3/uL (ref 0.7–4.0)
MCHC: 33.6 g/dL (ref 30.0–36.0)
MCV: 85.3 fl (ref 78.0–100.0)
Monocytes Absolute: 0.3 10*3/uL (ref 0.1–1.0)
Monocytes Relative: 7.6 % (ref 3.0–12.0)
Neutro Abs: 2.3 10*3/uL (ref 1.4–7.7)
Neutrophils Relative %: 52 % (ref 43.0–77.0)
Platelets: 216 10*3/uL (ref 150.0–400.0)
RBC: 3.74 Mil/uL — ABNORMAL LOW (ref 3.87–5.11)
RDW: 15.4 % (ref 11.5–15.5)
WBC: 4.4 10*3/uL (ref 4.0–10.5)

## 2019-11-18 LAB — BASIC METABOLIC PANEL
BUN: 14 mg/dL (ref 6–23)
CO2: 26 mEq/L (ref 19–32)
Calcium: 9.2 mg/dL (ref 8.4–10.5)
Chloride: 107 mEq/L (ref 96–112)
Creatinine, Ser: 0.65 mg/dL (ref 0.40–1.20)
GFR: 91.45 mL/min (ref >60.00)
Glucose, Bld: 86 mg/dL (ref 70–99)
Potassium: 4.1 mEq/L (ref 3.5–5.1)
Sodium: 140 mEq/L (ref 135–145)

## 2019-11-18 LAB — HEPATIC FUNCTION PANEL
ALT: 19 U/L (ref 0–35)
AST: 22 U/L (ref 0–37)
Albumin: 4.4 g/dL (ref 3.5–5.2)
Alkaline Phosphatase: 65 U/L (ref 39–117)
Bilirubin, Direct: 0.2 mg/dL (ref 0.0–0.3)
Total Bilirubin: 1.2 mg/dL (ref 0.2–1.2)
Total Protein: 6.7 g/dL (ref 6.0–8.3)

## 2019-11-18 LAB — VITAMIN D 25 HYDROXY (VIT D DEFICIENCY, FRACTURES): VITD: 48.77 ng/mL (ref 30.00–100.00)

## 2019-11-18 LAB — MAGNESIUM: Magnesium: 1.9 mg/dL (ref 1.5–2.5)

## 2019-11-18 LAB — TSH: TSH: 1.46 u[IU]/mL (ref 0.35–4.50)

## 2019-11-18 MED ORDER — PREGABALIN 200 MG PO CAPS: 200.0000 mg | ORAL_CAPSULE | Freq: Every day | ORAL | 3 refills | Status: DC

## 2019-11-18 NOTE — Patient Instructions (Addendum)
Reschedule your Welcome to Medicare Visit We'll notify you of your lab results and make any changes if needed Take the Lyrica 200mg  nightly If not already taking, add a multivitamin Drink plenty of fluids Continue to work on healthy diet and regular exercise- you can do it! Call with any questions or concerns Hang in there!

## 2019-11-18 NOTE — Assessment & Plan Note (Signed)
Deteriorated.  Lyrica 150mg  wasn't enough but she wasn't able to tolerate 300mg .  Will switch to 200mg  nightly in hopes of better symptom relief.  Offered Neuro referral but pt declined.  Discussed that combo of Mirapex and Lyrica can result in sedation, dizziness, dry mouth, confusion.  Pt expressed understanding and is in agreement w/ plan.

## 2019-11-18 NOTE — Progress Notes (Signed)
   Subjective:    Patient ID: Debra Matthews, female    DOB: 06-30-1955, 64 y.o.   MRN: [id number]  HPI RLS- 'just about ready to drive me crazy'.  Was not able to tolerate Lyrica 300mg  daily.  Is back on 150mg  daily but 'it's not enough'.  Some nights will add a Pramipexole.  Pt is wondering if she has a metabolic cause of her restless leg- iron, B12, Vit D, Mg  Was not able to tolerate Requip.  Gabapentin was ineffective.  Fatigue- has worsened over the past 6 weeks.  'i'm just absolutely wiped out.  It's different from what I had'.  Now impacting daily activities.  Pt has garden, horses to care for, house to care for, waits on husband 'hand and foot'.  Pt reports husband is 'very moody' and she is 'always on edge'  Overweight- pt has gained 6 lbs since last visit.   Review of Systems For ROS see HPI   This visit occurred during the SARS-CoV-2 public health emergency.  Safety protocols were in place, including screening questions prior to the visit, additional usage of staff PPE, and extensive cleaning of exam room while observing appropriate contact time as indicated for disinfecting solutions.       Objective:   Physical Exam Vitals reviewed.  Constitutional:      General: She is not in acute distress.    Appearance: Normal appearance. She is well-developed.  HENT:     Head: Normocephalic and atraumatic.  Eyes:     Conjunctiva/sclera: Conjunctivae normal.     Pupils: Pupils are equal, round, and reactive to light.  Neck:     Thyroid: No thyromegaly.  Cardiovascular:     Rate and Rhythm: Normal rate and regular rhythm.     Heart sounds: Normal heart sounds. No murmur.  Pulmonary:     Effort: Pulmonary effort is normal. No respiratory distress.     Breath sounds: Normal breath sounds.  Abdominal:     General: There is no distension.     Palpations: Abdomen is soft.     Tenderness: There is no abdominal tenderness.  Musculoskeletal:     Cervical back: Normal range of motion  and neck supple.  Lymphadenopathy:     Cervical: No cervical adenopathy.  Skin:    General: Skin is warm and dry.  Neurological:     Mental Status: She is alert and oriented to person, place, and time.  Psychiatric:        Behavior: Behavior normal.     Comments: anxious           Assessment & Plan:  Fatigue- new.  Pt states this feels different than her usual fatigue that results from her RLS.  Suspect it is a combo of RLS and anxiety/depression.  Will check labs to r/o metabolic cause of fatigue and treat any underlying issues if needed  Overweight- pt has gained weight since last visit.  She is typically a busy person but recently has struggled w/ energy b/c of her overwhelming fatigue.  Encouraged healthy diet and regular exercise.  Will follow.

## 2019-11-19 LAB — IRON,TIBC AND FERRITIN PANEL
%SAT: 9 % (calc) — ABNORMAL LOW (ref 16–45)
Ferritin: 6 ng/mL — ABNORMAL LOW (ref 16–288)
Iron: 43 ug/dL — ABNORMAL LOW (ref 45–160)
TIBC: 469 mcg/dL (calc) — ABNORMAL HIGH (ref 250–450)

## 2019-11-21 ENCOUNTER — Other Ambulatory Visit: Payer: Self-pay

## 2019-11-21 ENCOUNTER — Other Ambulatory Visit: Payer: Self-pay | Admitting: General Practice

## 2019-11-21 ENCOUNTER — Encounter: Payer: Self-pay | Admitting: Family Medicine

## 2019-11-21 DIAGNOSIS — D649 Anemia, unspecified: Secondary | ICD-10-CM

## 2019-11-21 MED ORDER — FERROUS SULFATE 325 (65 FE) MG PO TABS: 325.0000 mg | ORAL_TABLET | Freq: Every day | ORAL | 3 refills | Status: DC

## 2019-11-21 MED ORDER — FERROUS SULFATE 325 (65 FE) MG PO TABS
325.0000 mg | ORAL_TABLET | Freq: Every day | ORAL | 6 refills | Status: DC
Start: 2019-11-21 — End: 2020-05-29

## 2019-11-21 NOTE — Telephone Encounter (Signed)
Patient called back and I read Dr. Virgil Benedict note to her.

## 2019-11-29 ENCOUNTER — Other Ambulatory Visit (INDEPENDENT_AMBULATORY_CARE_PROVIDER_SITE_OTHER): Payer: Medicare Other

## 2019-11-29 ENCOUNTER — Other Ambulatory Visit: Payer: Self-pay

## 2019-11-29 DIAGNOSIS — D649 Anemia, unspecified: Secondary | ICD-10-CM | POA: Diagnosis not present

## 2019-11-29 LAB — FECAL OCCULT BLOOD, IMMUNOCHEMICAL: Fecal Occult Bld: NEGATIVE

## 2019-11-29 NOTE — Progress Notes (Signed)
i

## 2019-12-08 ENCOUNTER — Ambulatory Visit: Payer: 59 | Admitting: Family Medicine

## 2019-12-26 ENCOUNTER — Other Ambulatory Visit: Payer: Self-pay

## 2019-12-26 ENCOUNTER — Ambulatory Visit (INDEPENDENT_AMBULATORY_CARE_PROVIDER_SITE_OTHER): Payer: Medicare Other | Admitting: Emergency Medicine

## 2019-12-26 DIAGNOSIS — D649 Anemia, unspecified: Secondary | ICD-10-CM

## 2019-12-26 LAB — CBC WITH DIFFERENTIAL/PLATELET
Basophils Absolute: 0.1 10*3/uL (ref 0.0–0.1)
Basophils Relative: 1.1 % (ref 0.0–3.0)
Eosinophils Absolute: 0.1 10*3/uL (ref 0.0–0.7)
Eosinophils Relative: 1.3 % (ref 0.0–5.0)
HCT: 38.8 % (ref 36.0–46.0)
Hemoglobin: 12.9 g/dL (ref 12.0–15.0)
Lymphocytes Relative: 27.3 % (ref 12.0–46.0)
Lymphs Abs: 1.3 10*3/uL (ref 0.7–4.0)
MCHC: 33.2 g/dL (ref 30.0–36.0)
MCV: 90.1 fl (ref 78.0–100.0)
Monocytes Absolute: 0.2 10*3/uL (ref 0.1–1.0)
Monocytes Relative: 4.7 % (ref 3.0–12.0)
Neutro Abs: 3.1 10*3/uL (ref 1.4–7.7)
Neutrophils Relative %: 65.6 % (ref 43.0–77.0)
Platelets: 182 10*3/uL (ref 150.0–400.0)
RBC: 4.31 Mil/uL (ref 3.87–5.11)
RDW: 16.5 % — ABNORMAL HIGH (ref 11.5–15.5)
WBC: 4.7 10*3/uL (ref 4.0–10.5)

## 2019-12-27 ENCOUNTER — Other Ambulatory Visit: Payer: Self-pay | Admitting: Family Medicine

## 2019-12-27 ENCOUNTER — Encounter: Payer: Self-pay | Admitting: General Practice

## 2019-12-27 LAB — IRON,TIBC AND FERRITIN PANEL
%SAT: 24 % (calc) (ref 16–45)
Ferritin: 17 ng/mL (ref 16–288)
Iron: 99 ug/dL (ref 45–160)
TIBC: 416 mcg/dL (calc) (ref 250–450)

## 2020-05-18 ENCOUNTER — Encounter: Payer: Self-pay | Admitting: Family Medicine

## 2020-05-18 ENCOUNTER — Other Ambulatory Visit: Payer: Self-pay

## 2020-05-18 ENCOUNTER — Other Ambulatory Visit: Payer: Self-pay | Admitting: Family Medicine

## 2020-05-18 ENCOUNTER — Ambulatory Visit (INDEPENDENT_AMBULATORY_CARE_PROVIDER_SITE_OTHER): Payer: Medicare Other | Admitting: Family Medicine

## 2020-05-18 VITALS — BP 124/72 | HR 76 | Temp 98.3°F | Resp 20 | Ht 62.0 in | Wt 144.6 lb

## 2020-05-18 DIAGNOSIS — M533 Sacrococcygeal disorders, not elsewhere classified: Secondary | ICD-10-CM

## 2020-05-18 DIAGNOSIS — M79672 Pain in left foot: Secondary | ICD-10-CM

## 2020-05-18 DIAGNOSIS — G4700 Insomnia, unspecified: Secondary | ICD-10-CM

## 2020-05-18 DIAGNOSIS — Z23 Encounter for immunization: Secondary | ICD-10-CM

## 2020-05-18 DIAGNOSIS — M79671 Pain in right foot: Secondary | ICD-10-CM

## 2020-05-18 DIAGNOSIS — G2581 Restless legs syndrome: Secondary | ICD-10-CM

## 2020-05-18 MED ORDER — TEMAZEPAM 7.5 MG PO CAPS
7.5000 mg | ORAL_CAPSULE | Freq: Every evening | ORAL | 3 refills | Status: DC | PRN
Start: 2020-05-18 — End: 2020-10-22

## 2020-05-18 MED ORDER — PRAMIPEXOLE DIHYDROCHLORIDE 1 MG PO TABS
ORAL_TABLET | ORAL | 1 refills | Status: DC
Start: 2020-05-18 — End: 2021-03-20

## 2020-05-18 NOTE — Telephone Encounter (Signed)
Medication Refills  Medication:  Generic for flonase   Pharmacy:  [facility] in [place]  ** Let patient know to contact pharmacy at the end of the day to make sure medication is ready.**  ** Please notify patient to allow 48-72 hours to process.**  ** Encourage patient to contact the pharmacy for refills or they can request refills through [facility]**  Clinical Fills out below:   Last refill:  QTY:  Refill Date:    Other Comments:   Okay for refill?  Please advise.

## 2020-05-18 NOTE — Patient Instructions (Signed)
Follow as needed or as scheduled Go get your xray at [facility]- they should call you to schedule We'll call you with your [facility] referral START the low dose Restoril nightly (Temazepam) to help w/ sleep We'll call you with your Neurology appt for the insomnia and restless leg Call with any questions or concerns Stay Safe!  Stay Healthy! Happy Holidays!!!

## 2020-05-18 NOTE — Progress Notes (Signed)
   Subjective:    Patient ID: Debra Matthews, female    DOB: 07/28/1955, 64 y.o.   MRN: [id number]  HPI Insomnia- didn't feel 'normal' on Lyrica or Gabapentin.  Is currently taking Mirapex for RLS but this is not well controlled.  Reports she is only getting ~2 hrs/sleep at a time.  Has taken Trazodone, Lunesta, Belsomra, Lorazepam.  'I can't relax, I can't shut my head off'.    Pain in tailbone- 'walking it's fine but sitting is painful'.  Hard to sit on anything firm or sit for prolonged periods.  TTP.  'if it's cushioned, i'm pretty good'.  2-3 months.  No injury.  No riding horses or biking.  Bilateral foot pain- pt has had pain in L foot 'for years and years'.  'toes will go numb w/ a piercing pain' along MTP joints.  After an hour she will start limping.  Has been seeing ortho foot and ankle since last year.  Had MRI, multiple injections.  Has custom inserts- no relief.  Now middle toe on R foot 'looks like it has a marble in it'.  Had xray in September 2021- bone spur noted.  Is considering [facility] for 2nd opinion.   Review of Systems For ROS see HPI   This visit occurred during the SARS-CoV-2 public health emergency.  Safety protocols were in place, including screening questions prior to the visit, additional usage of staff PPE, and extensive cleaning of exam room while observing appropriate contact time as indicated for disinfecting solutions.       Objective:   Physical Exam Vitals reviewed.  Constitutional:      General: She is not in acute distress.    Appearance: Normal appearance. She is not ill-appearing.  HENT:     Head: Normocephalic and atraumatic.  Eyes:     Extraocular Movements: Extraocular movements intact.     Conjunctiva/sclera: Conjunctivae normal.     Pupils: Pupils are equal, round, and reactive to light.  Musculoskeletal:        General: No tenderness (no overt TTP over coccyx).  Skin:    General: Skin is warm and dry.  Neurological:      General: No focal deficit present.     Mental Status: She is alert and oriented to person, place, and time.     Cranial Nerves: No cranial nerve deficit.     Motor: No weakness.  Psychiatric:        Mood and Affect: Mood normal.        Behavior: Behavior normal.        Thought Content: Thought content normal.           Assessment & Plan:  Bilateral foot pain- pt has been seeing ortho foot and ankle but after MRI, multiple injxns, and custom orthotics nothing has improved.  Agree that a 2nd opinion would be worthwhile.  Will refer to [facility].  Pt expressed understanding and is in agreement w/ plan.   Pain in coccyx- new.  No known injury.  Tolerable if chair is cushioned/padded.  Will get xray to assess for fx or degeneration.  Will determine next steps based on imaging results.  Pt expressed understanding and is in agreement w/ plan.

## 2020-05-25 ENCOUNTER — Encounter: Payer: Self-pay | Admitting: Podiatry

## 2020-05-25 ENCOUNTER — Ambulatory Visit (HOSPITAL_COMMUNITY): Payer: Medicare Other

## 2020-05-25 ENCOUNTER — Ambulatory Visit (HOSPITAL_COMMUNITY)
Admission: RE | Admit: 2020-05-25 | Discharge: 2020-05-25 | Disposition: A | Discharge status: Home / Self Care | Payer: Medicare Other | Source: Ambulatory Visit | Attending: Family Medicine | Admitting: Family Medicine

## 2020-05-25 ENCOUNTER — Ambulatory Visit (INDEPENDENT_AMBULATORY_CARE_PROVIDER_SITE_OTHER): Payer: Medicare Other | Admitting: Podiatry

## 2020-05-25 ENCOUNTER — Ambulatory Visit (INDEPENDENT_AMBULATORY_CARE_PROVIDER_SITE_OTHER): Payer: Medicare Other

## 2020-05-25 ENCOUNTER — Other Ambulatory Visit: Payer: Self-pay

## 2020-05-25 DIAGNOSIS — M2041 Other hammer toe(s) (acquired), right foot: Secondary | ICD-10-CM

## 2020-05-25 DIAGNOSIS — S9030XA Contusion of unspecified foot, initial encounter: Secondary | ICD-10-CM

## 2020-05-25 DIAGNOSIS — G5762 Lesion of plantar nerve, left lower limb: Secondary | ICD-10-CM

## 2020-05-25 DIAGNOSIS — M533 Sacrococcygeal disorders, not elsewhere classified: Secondary | ICD-10-CM | POA: Insufficient documentation

## 2020-05-25 DIAGNOSIS — M779 Enthesopathy, unspecified: Secondary | ICD-10-CM

## 2020-05-25 DIAGNOSIS — D3610 Benign neoplasm of peripheral nerves and autonomic nervous system, unspecified: Secondary | ICD-10-CM

## 2020-05-26 NOTE — Progress Notes (Signed)
Subjective:   Patient ID: Debra Matthews, female   DOB: 64 y.o.   MRN: [id number]   HPI Patient presents with shooting pain between the third and fourth toe of the left foot with radiating discomforts that have been present for years worse over the last 2 years with history of injections and on the right is found to have swelling and pain of the distal interphalangeal joint that has been a more recent problem.  Patient does not smoke likes to be active and states the pain becomes debilitating at times and is worse with tighter shoes   Review of Systems  All other systems reviewed and are negative.       Objective:  Physical Exam Vitals and nursing note reviewed.  Constitutional:      Appearance: She is well-developed and well-nourished.  Cardiovascular:     Pulses: Intact distal pulses.  Pulmonary:     Effort: Pulmonary effort is normal.  Musculoskeletal:        General: Normal range of motion.  Skin:    General: Skin is warm.  Neurological:     Mental Status: She is alert.     Neurovascular status intact muscle strength was found to be adequate range of motion adequate.  Patient is found to have shooting radiating discomfort third interspace left foot with pain into the adjacent digits and on the top of the right third toe there is swelling occurring at the distal to phalangeal joint with pain.  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  I have recommended neurectomy left and possible distal arthroplasty) to try conservative first and today for the left I did sterile prep and injected the nerve III milligrams Kenalog 5 mg  Xylocaine and for the right I anesthetized the digit and injected the joint 1.5 mg Kenalog 3 mg Xylocaine and reappoint for [place] to recheck again in January to decide what will be best long-term  X-rays indicate there is no signs of stress fracture no signs of arthritis with moderate compression distal interphalangeal joint digit 3 right

## 2020-05-28 ENCOUNTER — Encounter: Payer: Self-pay | Admitting: Family Medicine

## 2020-05-29 ENCOUNTER — Other Ambulatory Visit: Payer: Self-pay

## 2020-05-29 ENCOUNTER — Encounter: Payer: Self-pay | Admitting: Neurology

## 2020-05-29 ENCOUNTER — Ambulatory Visit (INDEPENDENT_AMBULATORY_CARE_PROVIDER_SITE_OTHER): Payer: Medicare Other | Admitting: Neurology

## 2020-05-29 VITALS — BP 142/84 | HR 77 | Ht 62.0 in | Wt 144.0 lb

## 2020-05-29 DIAGNOSIS — G4762 Sleep related leg cramps: Secondary | ICD-10-CM | POA: Insufficient documentation

## 2020-05-29 DIAGNOSIS — F5104 Psychophysiologic insomnia: Secondary | ICD-10-CM

## 2020-05-29 DIAGNOSIS — F429 Obsessive-compulsive disorder, unspecified: Secondary | ICD-10-CM

## 2020-05-29 DIAGNOSIS — D509 Iron deficiency anemia, unspecified: Secondary | ICD-10-CM

## 2020-05-29 DIAGNOSIS — G2581 Restless legs syndrome: Secondary | ICD-10-CM

## 2020-05-29 MED ORDER — TRAZODONE HCL 50 MG PO TABS: 50.0000 mg | ORAL_TABLET | Freq: Every evening | ORAL | 1 refills | Status: DC | PRN

## 2020-05-29 NOTE — Patient Instructions (Signed)
Restless Legs Syndrome Restless legs syndrome is a condition that causes uncomfortable feelings or sensations in the legs, especially while sitting or lying down. The sensations usually cause an overwhelming urge to move the legs. The arms can also sometimes be affected. The condition can range from mild to severe. The symptoms often interfere with a person's ability to sleep. What are the causes? The cause of this condition is not known. What increases the risk? The following factors may make you more likely to develop this condition: Being older than 50. Pregnancy. Being a woman. In general, the condition is more common in women than in men. A family history of the condition. Having iron deficiency. Overuse of caffeine, nicotine, or alcohol. Certain medical conditions, such as kidney disease, Parkinson's disease, or nerve damage. Certain medicines, such as those for high blood pressure, nausea, colds, allergies, depression, and some heart conditions. What are the signs or symptoms? The main symptom of this condition is uncomfortable sensations in the legs, such as: Pulling. Tingling. Prickling. Throbbing. Crawling. Burning. Usually, the sensations: Affect both sides of the body. Are worse when you sit or lie down. Are worse at night. These may wake you up or make it difficult to fall asleep. Make you have a strong urge to move your legs. Are temporarily relieved by moving your legs. The arms can also be affected, but this is rare. People who have this condition often have tiredness during the day because of their lack of sleep at night. How is this diagnosed? This condition may be diagnosed based on: Your symptoms. Blood tests. In some cases, you may be monitored in a sleep lab by a specialist (a sleep study). This can detect any disruptions in your sleep. How is this treated? This condition is treated by managing the symptoms. This may include: Lifestyle changes, such as  exercising, using relaxation techniques, and avoiding caffeine, alcohol, or tobacco. Medicines. Anti-seizure medicines may be tried first. Follow these instructions at home:     General instructions Take over-the-counter and prescription medicines only as told by your health care provider. Use methods to help relieve the uncomfortable sensations, such as: Massaging your legs. Walking or stretching. Taking a cold or hot bath. Keep all follow-up visits as told by your health care provider. This is important. Lifestyle Practice good sleep habits. For example, go to bed and get up at the same time every day. Most adults should get 7-9 hours of sleep each night. Exercise regularly. Try to get at least 30 minutes of exercise most days of the week. Practice ways of relaxing, such as yoga or meditation. Avoid caffeine and alcohol. Do not use any products that contain nicotine or tobacco, such as cigarettes and e-cigarettes. If you need help quitting, ask your health care provider. Contact a health care provider if: Your symptoms get worse or they do not improve with treatment. Summary Restless legs syndrome is a condition that causes uncomfortable feelings or sensations in the legs, especially while sitting or lying down. The symptoms often interfere with a person's ability to sleep. This condition is treated by managing the symptoms. You may need to make lifestyle changes or take medicines. This information is not intended to replace advice given to you by your health care provider. Make sure you discuss any questions you have with your health care provider. Document Revised: 06/22/2017 Document Reviewed: 06/22/2017 Elsevier Patient Education  [place]. Insomnia Insomnia is a sleep disorder that makes it difficult to fall asleep or  stay asleep. Insomnia can cause fatigue, low energy, difficulty concentrating, mood swings, and poor performance at work or school. There are three  different ways to classify insomnia:  Difficulty falling asleep.  Difficulty staying asleep.  Waking up too early in the morning. Any type of insomnia can be long-term (chronic) or short-term (acute). Both are common. Short-term insomnia usually lasts for three months or less. Chronic insomnia occurs at least three times a week for longer than three months. What are the causes? Insomnia may be caused by another condition, situation, or substance, such as:  Anxiety.  Certain medicines.  Gastroesophageal reflux disease (GERD) or other gastrointestinal conditions.  Asthma or other breathing conditions.  Restless legs syndrome, sleep apnea, or other sleep disorders.  Chronic pain.  Menopause.  Stroke.  Abuse of alcohol, tobacco, or illegal drugs.  Mental health conditions, such as depression.  Caffeine.  Neurological disorders, such as Alzheimer's disease.  An overactive thyroid (hyperthyroidism). Sometimes, the cause of insomnia may not be known. What increases the risk? Risk factors for insomnia include:  Gender. Women are affected more often than men.  Age. Insomnia is more common as you get older.  Stress.  Lack of exercise.  Irregular work schedule or working night shifts.  Traveling between different time zones.  Certain medical and mental health conditions. What are the signs or symptoms? If you have insomnia, the main symptom is having trouble falling asleep or having trouble staying asleep. This may lead to other symptoms, such as:  Feeling fatigued or having low energy.  Feeling nervous about going to sleep.  Not feeling rested in the morning.  Having trouble concentrating.  Feeling irritable, anxious, or depressed. How is this diagnosed? This condition may be diagnosed based on:  Your symptoms and medical history. Your health care provider may ask about: ? Your sleep habits. ? Any medical conditions you have. ? Your mental health.  A  physical exam. How is this treated? Treatment for insomnia depends on the cause. Treatment may focus on treating an underlying condition that is causing insomnia. Treatment may also include:  Medicines to help you sleep.  Counseling or therapy.  Lifestyle adjustments to help you sleep better. Follow these instructions at home: Eating and drinking   Limit or avoid alcohol, caffeinated beverages, and cigarettes, especially close to bedtime. These can disrupt your sleep.  Do not eat a large meal or eat spicy foods right before bedtime. This can lead to digestive discomfort that can make it hard for you to sleep. Sleep habits   Keep a sleep diary to help you and your health care provider figure out what could be causing your insomnia. Write down: ? When you sleep. ? When you wake up during the night. ? How well you sleep. ? How rested you feel the next day. ? Any side effects of medicines you are taking. ? What you eat and drink.  Make your bedroom a dark, comfortable place where it is easy to fall asleep. ? Put up shades or blackout curtains to block light from outside. ? Use a white noise machine to block noise. ? Keep the temperature cool.  Limit screen use before bedtime. This includes: ? Watching TV. ? Using your smartphone, tablet, or computer.  Stick to a routine that includes going to bed and waking up at the same times every day and night. This can help you fall asleep faster. Consider making a quiet activity, such as reading, part of your nighttime routine.  Try to avoid taking naps during the day so that you sleep better at night.  Get out of bed if you are still awake after 15 minutes of trying to sleep. Keep the lights down, but try reading or doing a quiet activity. When you feel sleepy, go back to bed. General instructions  Take over-the-counter and prescription medicines only as told by your health care provider.  Exercise regularly, as told by your health  care provider. Avoid exercise starting several hours before bedtime.  Use relaxation techniques to manage stress. Ask your health care provider to suggest some techniques that may work well for you. These may include: ? Breathing exercises. ? Routines to release muscle tension. ? Visualizing peaceful scenes.  Make sure that you drive carefully. Avoid driving if you feel very sleepy.  Keep all follow-up visits as told by your health care provider. This is important. Contact a health care provider if:  You are tired throughout the day.  You have trouble in your daily routine due to sleepiness.  You continue to have sleep problems, or your sleep problems get worse. Get help right away if:  You have serious thoughts about hurting yourself or someone else. If you ever feel like you may hurt yourself or others, or have thoughts about taking your own life, get help right away. You can go to your nearest emergency department or call:  Your local emergency services (911 in the [place]  A suicide crisis helpline, such as the [facility] at 540-301-1703. This is open 24 hours a day. Summary  Insomnia is a sleep disorder that makes it difficult to fall asleep or stay asleep.  Insomnia can be long-term (chronic) or short-term (acute).  Treatment for insomnia depends on the cause. Treatment may focus on treating an underlying condition that is causing insomnia.  Keep a sleep diary to help you and your health care provider figure out what could be causing your insomnia. This information is not intended to replace advice given to you by your health care provider. Make sure you discuss any questions you have with your health care provider. Document Revised: 05/15/2017 Document Reviewed: 03/12/2017 Elsevier Patient Education  2020 [employer].

## 2020-05-29 NOTE — Progress Notes (Signed)
SLEEP MEDICINE CLINIC    Provider:  Larey Seat, MD  Primary Care Physician:  Midge Minium, MD [address] [place] [address]     Referring Provider: Midge Minium, [place] [address] [place],   [id number]          Chief Complaint according to patient   Patient presents with:    . New Patient (Initial Visit)     chronic insomnia.       HISTORY OF PRESENT ILLNESS:  Debra Matthews is a 64  year old Caucasian female patient who was seen upon a referral on 05/29/2020 from Dr Birdie Riddle.  Chief concern according to patient :  I have RLS and can't sleep well for well over a decade. I also had the right  parial knee replaced.  She could not tolerate Ropinorol ( 'I got tense"  and gabapentin made her drowsy, but Mirapex was well tolerated and now struggles with anticipation, early and earlier in the day. Has a lot of muscle cramps.      Debra Matthews is a right -handed  Caucasian female with Insomnia and RLS, with severe muscle cramping  She has a past medical history of Abdominal pain, epigastric, Anemia, Arthritis, Cancer (HCC), Chronic constipation, Cyst in hand, Gastritis due to nonsteroidal anti-inflammatory drug (NSAID), GERD (gastroesophageal reflux disease), Headache, History of chicken pox, History of shingles, RLS (restless legs syndrome), and Subchondral insufficiency fracture of femoral condyle, right, initial encounter ([facility]) (07/15/2016). She has chronic insomnia and OCD tendendencies. She described herself as a "clean freak".  The patient never had a sleep study. She had iron infusions in 2015 - anemia panel, followed by hematology.  Was attributed to ulcerative gastritis.    Sleep relevant medical history: Nocturia following arousal, Sleep walking on Ambien,no TBI, no ENT procedures.   Family medical /sleep history: No other family member with OSA, insomnia, RLS.     Social history: her husband works third shift- she stays up - and she cleaned the  house, the barn.   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 3 adult children, 5 grandchildren. Helps home-schooling  In [facility].  Pets are present. 6 miniature horses, 2 cats,  lost her dog. Tobacco use: never .  ETOH use - seldomly ,  Caffeine intake in form of Coffee( /) Soda( /) Tea ( at restaurants) or energy drinks. 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 Swimming in summer.      Sleep habits are as follows: The patient's dinner time is between 6 PM. The patient goes to bed at 10.30 PM and struggles with RLS and cramps, once asleep , she continues to sleep for intervals of 2 hours, wakes for unknown reasons- can be RLS related, can be spontaneous- , the first time at 2-3 AM.   The preferred sleep position is variable , with the support of 1 pillow only in spite of GERD. Adjustable bed - 15 degrees for husbands COPD.  Dreams are reportedly rare. 2-4 AM is the usual rise time.  She feels wide awake and goes to watch TV.  The patient wakes up spontaneously. Average sleep in 24 hours- 4 hours.   She reports not feeling refreshed or restored in AM, with symptoms such as leg pain,  Sinus headaches, and residual fatigue. Naps are taken infrequently, dozing off in front of the  TV or while reading ,lasting from 15-30 minutes is refreshing as nocturnal sleep.    Review of Systems: Out of a complete 14 system review, the patient complains of only the following symptoms, and all other reviewed systems are negative.:  Fatigue, sleepiness , snoring, fragmented sleep,  Insomnia , RLS and sleep hygiene, GERD.    How likely are you to doze in the following situations: 0 = not likely, 1 = slight chance, 2 = moderate chance, 3 = high chance   Sitting and Reading? Watching Television? Sitting inactive in a public place (theater or meeting)? As a passenger in a car for an hour without a break? Lying down in the afternoon when  circumstances permit? Sitting and talking to someone? Sitting quietly after lunch without alcohol? In a car, while stopped for a few minutes in traffic?   Total = 14/ 24 points   FSS endorsed at 46/ 63 points.   Social History   Socioeconomic History  . Marital status: Married    Spouse name: Not on file  . Number of children: 3  . Years of education: Not on file  . Highest education level: Not on file  Occupational History  . Occupation: retired  Tobacco Use  . Smoking status: Never Smoker  . Smokeless tobacco: Never Used  Vaping Use  . Vaping Use: Never used  Substance and Sexual Activity  . Alcohol use: No    Alcohol/week: 0.0 standard drinks  . Drug use: No  . Sexual activity: Yes  Other Topics Concern  . Not on file  Social History Narrative  . Not on file   Social Determinants of Health   Financial Resource Strain: Not on file  Food Insecurity: Not on file  Transportation Needs: Not on file  Physical Activity: Not on file  Stress: Not on file  Social Connections: Not on file    Family History  Problem Relation Age of Onset  . Scoliosis Mother   . Mitral valve prolapse Mother   . Colon cancer Maternal Aunt     Past Medical History:  Diagnosis Date  . Abdominal pain, epigastric   . Anemia    hx of iron def.  . Arthritis   . Cancer ([place])    non melanoma skin cancer  chest  . Chronic constipation    in past  . Cyst in hand    right wrist  . 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 GERD (gastroesophageal reflux disease)   . Headache    hx of   . History of chicken pox   . History of shingles   . RLS (restless legs syndrome)   . Subchondral insufficiency fracture of femoral condyle, right, initial encounter ([facility]) 07/15/2016    Past Surgical History:  Procedure Laterality Date  . KNEE ARTHROSCOPY WITH MENISCAL REPAIR Right 05/18/2017   Procedure: KNEE ARTHROSCOPY WITH PARTIAL LATERAL MENISCECTOMY;  Surgeon: Paralee Cancel,  MD;  Location: [facility];  Service: Orthopedics;  Laterality: Right;  Adductor Block  . MEDIAL PARTIAL KNEE REPLACEMENT Right 05/18/2017   Procedure: RIGHT MEDIAL PARTIAL KNEE REPLACEMENT;  Surgeon: Paralee Cancel, MD;  Location: [facility];  Service: Orthopedics;  Laterality: Right;  Adductor Block  . TUBAL LIGATION       Current Outpatient Medications on File Prior to Visit  Medication Sig Dispense Refill  . clobetasol ointment (TEMOVATE) 5.57 % Apply 1 application topically 2 (two) times daily as needed for rash.  2  . fluticasone (FLONASE)  50 MCG/ACT nasal spray SPRAY 2 SPRAYS INTO EACH NOSTRIL EVERY DAY 48 mL 2  . pramipexole (MIRAPEX) 1 MG tablet TAKE 1 TABLET (1 MG TOTAL) BY MOUTH 3 (THREE) TIMES DAILY. (Patient taking differently: Take 1.5-3 mg by mouth daily. 1.5 tablet daily with additional 1.5 tab as needed for break-through) 270 tablet 1  . tretinoin (RETIN-A) 0.025 % cream APPLY ON THE SKIN AT BEDTIME APPLY TO FACE AT BEDTIME AS NEEDED FOR ACNE  3  . valACYclovir (VALTREX) 500 MG tablet Take 1 tablet (500 mg total) by mouth 2 (two) times daily as needed (cold sores). 180 tablet 0  . Wheat Dextrin (BENEFIBER PO) Take 1 scoop by mouth 2 (two) times daily as needed (constipation).     . temazepam (RESTORIL) 7.5 MG capsule Take 1 capsule (7.5 mg total) by mouth at bedtime as needed for sleep. (Patient not taking: Reported on 05/29/2020) 30 capsule 3   No current facility-administered medications on file prior to visit.    Allergies  Allergen Reactions  . Pseudoephedrine Other (See Comments)    Insomnia  . Prednisone     Pt feels bad, headache, no appetite.    Physical exam:  Today's Vitals   05/29/20 0846  BP: (!) 142/84  Pulse: 77  Weight: 144 lb (65.3 kg)  Height: 5\' 2"  (1.575 m)   Body mass index is 26.34 kg/m.   Wt Readings from Last 3 Encounters:  05/29/20 144 lb (65.3 kg)  05/18/20 144 lb 9.6 oz (65.6 kg)  11/18/19 147 lb 2 oz (66.7 kg)     Ht Readings from Last 3  Encounters:  05/29/20 5\' 2"  (1.575 m)  05/18/20 5\' 2"  (1.575 m)  11/18/19 5\' 2"  (1.575 m)      General: The patient is awake, alert and appears not in acute distress.   She is logorrhoeic.  The patient is well groomed. Head: Normocephalic, atraumatic. Neck is supple. Mallampati  1 ,  neck circumference: 14 inches . Nasal airflow patent.  Retrognathia is not  seen.  Dental status: intact  Cardiovascular:  Regular rate and cardiac rhythm by pulse,  without distended neck veins. Respiratory: Lungs are clear to auscultation.  Skin:  Without evidence of ankle edema, or rash. Trunk: The patient's posture is erect.   Neurologic exam : The patient is awake and alert, oriented to place and time.   Memory subjective described as intact.  Attention span & concentration ability appears normal.  Speech is fluent,  without  dysarthria, dysphonia or aphasia.  Mood and affect are appropriate.   Cranial nerves: no loss of smell or taste reported, fully vaccinated for COVID.   Pupils are equal and briskly reactive to light. Funduscopic exam deferred. .  Extraocular movements in vertical and horizontal planes were intact and without nystagmus. No Diplopia. Visual fields by finger perimetry are intact. Hearing was intact to soft voice and finger rubbing. Facial sensation intact to fine touch. Facial motor strength is symmetric and tongue and uvula move midline. Neck ROM : rotation, tilt and flexion extension were normal for age and shoulder shrug was symmetrical.    Motor exam:  Symmetric bulk, tone and ROM.   Normal tone without cog- wheeling, symmetric grip strength .  Sensory:  Fine touch, pinprick and vibration were tested  and  normal.  Proprioception tested in the upper extremities was normal. Coordination: Rapid alternating movements in the fingers/hands were of normal speed.  The Finger-to-nose maneuver was intact without evidence of ataxia, dysmetria  or tremor.  Gait and station: Patient  could rise unassisted from a seated position, walked without assistive device.  Stance is of normal width/ base and the patient turned with 3 steps.  Toe and heel walk were deferred.  Deep tendon reflexes: in the upper and lower extremities are brisk, symmetric and intact.  Babinski response was deferred.        After spending a total time of 45 minutes face to face and additional time for physical and neurologic examination, review of laboratory studies,  personal review of imaging studies, reports and results of other testing and review of referral information / records as far as provided in visit, I have established the following assessments:  1) chronic insomnia related to OCD, high level of internal stress. 2)  RLS and leg cramping.  3)  Chronically sleep deprived due to both  4) Anemia has been followed and iron levels were reportedly normal in October.    My Plan is to proceed with:  1) I would prefer an attended sleep study but patient may not be able to sleep in a lab. So we start with a HST- rule out apnea- and I will increase mirapex.  Just started on Restoril- no effect.   2) Trazodone has been used in the past and helped for awhile, we can restart.  50 mg at night.   3) Anemia, ferritin was 6 ng/ mL in June 2021. 17 in  July, with hemoglb at 10.7 , her HCT at 85 - both low.   She should have 50 as a RLS patient.   Consider SSRI in the future to get OCD relief.   I would like to thank  Midge Minium, Md [address] [place],  [place] [address] for allowing me to meet with and to take care of this pleasant patient.   In short, Debra Matthews is presenting with RLS, cramping, and chronic insomnia, all symptom that can be attributed to RLS and psychological insomnia as well.   I plan to follow up either personally or through our NP within 3 month.   CC: I will share my notes with PCP.   Electronically signed by: Larey Seat, MD 05/29/2020 9:04 AM  [facility] Board certified by The [employer] of Sleep Medicine and Diplomate of the [employer] of Sleep Medicine. Board certified In Neurology through the [employer], Fellow of the [employer] of Neurology. Medical Director of [facility].

## 2020-05-30 ENCOUNTER — Encounter: Payer: Self-pay | Admitting: Neurology

## 2020-05-30 ENCOUNTER — Institutional Professional Consult (permissible substitution): Payer: Medicare Other | Admitting: Neurology

## 2020-05-30 LAB — ELECTROLYTE PANEL
CO2: 22 mmol/L (ref 20–29)
Chloride: 102 mmol/L (ref 96–106)
Potassium: 4.8 mmol/L (ref 3.5–5.2)
Sodium: 138 mmol/L (ref 134–144)

## 2020-05-30 LAB — IRON,TIBC AND FERRITIN PANEL
Ferritin: 13 ng/mL — ABNORMAL LOW (ref 15–150)
Iron Saturation: 18 % (ref 15–55)
Iron: 79 ug/dL (ref 27–139)
Total Iron Binding Capacity: 431 ug/dL (ref 250–450)
UIBC: 352 ug/dL (ref 118–369)

## 2020-05-30 NOTE — Progress Notes (Signed)
Much to low ferritin, invite patient for IV iron supplement .

## 2020-05-31 ENCOUNTER — Telehealth: Payer: Self-pay | Admitting: Neurology

## 2020-05-31 ENCOUNTER — Other Ambulatory Visit: Payer: Self-pay | Admitting: Neurology

## 2020-05-31 NOTE — Assessment & Plan Note (Signed)
Ongoing issue.  Has been on Requip w/o relief.  Now on Mirapex w/o relief.  Not able to tolerate Lyrica or Gabapentin.  Will refer to [facility] for evaluation and tx.

## 2020-05-31 NOTE — Telephone Encounter (Signed)
-----   Message from Larey Seat, MD sent at 05/30/2020  6:04 PM EST ----- Much to low ferritin, invite patient for IV iron supplement .

## 2020-05-31 NOTE — Assessment & Plan Note (Signed)
Ongoing issue for pt.  Likely multifactorial- anxiety, RLS.  She has taken Trazodone, Lunesta, Belsomra, Lorazepam w/o relief.  Did not tolerate Lyrica or Gabapentin.  On Mirapex for RLS but this is not well controlled.  At this time, she needs to see a sleep specialist to help get her sxs under better control.  Will refer to Dr Brett Fairy at [facility].  Pt expressed understanding and is in agreement w/ plan.

## 2020-05-31 NOTE — Telephone Encounter (Signed)
Called and reviewed the lab results with the patient. The ferritin level is significantly low. Dr Brett Fairy would like to get that at 58 or above to help with RLS symptoms. Pt will come for IV Iron infusion to help assist with bringing that up. Advised the infusion suite will be in contact once they have insurance authorization to get her scheduled. Pt will repeat labs 2 weeks post infusion. Pt verbalized understanding. Pt had no questions at this time but was encouraged to call back if questions arise.

## 2020-06-20 ENCOUNTER — Other Ambulatory Visit: Payer: Self-pay | Admitting: Neurology

## 2020-06-20 ENCOUNTER — Ambulatory Visit (INDEPENDENT_AMBULATORY_CARE_PROVIDER_SITE_OTHER): Payer: Medicare Other | Admitting: Podiatry

## 2020-06-20 ENCOUNTER — Other Ambulatory Visit: Payer: Self-pay

## 2020-06-20 ENCOUNTER — Other Ambulatory Visit (INDEPENDENT_AMBULATORY_CARE_PROVIDER_SITE_OTHER): Payer: Self-pay

## 2020-06-20 DIAGNOSIS — D509 Iron deficiency anemia, unspecified: Secondary | ICD-10-CM

## 2020-06-20 DIAGNOSIS — G4762 Sleep related leg cramps: Secondary | ICD-10-CM

## 2020-06-20 DIAGNOSIS — G2581 Restless legs syndrome: Secondary | ICD-10-CM

## 2020-06-20 DIAGNOSIS — M2041 Other hammer toe(s) (acquired), right foot: Secondary | ICD-10-CM

## 2020-06-20 DIAGNOSIS — F5104 Psychophysiologic insomnia: Secondary | ICD-10-CM

## 2020-06-20 DIAGNOSIS — D3610 Benign neoplasm of peripheral nerves and autonomic nervous system, unspecified: Secondary | ICD-10-CM | POA: Diagnosis not present

## 2020-06-20 DIAGNOSIS — Z0289 Encounter for other administrative examinations: Secondary | ICD-10-CM

## 2020-06-20 DIAGNOSIS — F429 Obsessive-compulsive disorder, unspecified: Secondary | ICD-10-CM

## 2020-06-20 NOTE — Progress Notes (Signed)
Subjective:   Patient ID: [name], female   DOB: 65 y.o.   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  Patient wants surgery and I allowed her to read consent form for correction explaining all procedures that will be necessary for neurectomy and distal arthroplasty and reviewed risk alternative treatments.  Patient wants surgery signed consent form and will be scheduled for outpatient surgery and I actually made him aware to her that there is no guarantee that this will solve her problem.  Patient is completely understanding and is scheduled for outpatient surgery encouraged to call with questions

## 2020-06-21 ENCOUNTER — Encounter: Payer: Self-pay | Admitting: Neurology

## 2020-06-21 LAB — IRON,TIBC AND FERRITIN PANEL
Ferritin: 44 ng/mL (ref 15–150)
Iron Saturation: 14 % — ABNORMAL LOW (ref 15–55)
Iron: 53 ug/dL (ref 27–139)
Total Iron Binding Capacity: 379 ug/dL (ref 250–450)
UIBC: 326 ug/dL (ref 118–369)

## 2020-06-21 NOTE — Progress Notes (Signed)
Ferritin is still under 50 ( slightly decreased now) and has improved  , but the iron saturation remains still very low.  I like for the patient to have one IV iron supplementation after her foot surgery- January 11 th and then continue with an oral supplement (if GI- tolerated).  I will share with Dr. Beverely Low.

## 2020-06-26 ENCOUNTER — Institutional Professional Consult (permissible substitution): Payer: Medicare Other | Admitting: Neurology

## 2020-06-27 ENCOUNTER — Encounter: Payer: Self-pay | Admitting: Neurology

## 2020-06-27 NOTE — Telephone Encounter (Signed)
Absolutely fine to get labs 1 week later,  you could even ask to have the pre OP labs include TIBC, iron saturation and ferritin level- I can send the order to you to go through outside lab if you like.

## 2020-06-28 ENCOUNTER — Encounter: Payer: Self-pay | Admitting: Neurology

## 2020-07-03 ENCOUNTER — Encounter: Payer: Self-pay | Admitting: Neurology

## 2020-07-03 ENCOUNTER — Other Ambulatory Visit: Payer: Self-pay | Admitting: Neurology

## 2020-07-03 DIAGNOSIS — G2581 Restless legs syndrome: Secondary | ICD-10-CM

## 2020-07-03 DIAGNOSIS — D509 Iron deficiency anemia, unspecified: Secondary | ICD-10-CM

## 2020-07-03 DIAGNOSIS — G4762 Sleep related leg cramps: Secondary | ICD-10-CM

## 2020-07-03 NOTE — Telephone Encounter (Signed)
My nurse has send the orders, and I signed them today . You may contact Gerline Legacy, RN

## 2020-07-09 MED ORDER — HYDROCODONE-ACETAMINOPHEN 10-325 MG PO TABS: 1.0000 | ORAL_TABLET | Freq: Three times a day (TID) | ORAL | 0 refills | Status: AC | PRN

## 2020-07-09 NOTE — Addendum Note (Signed)
Addended by: Wallene Huh on: 07/09/2020 01:12 PM   Modules accepted: Orders

## 2020-07-13 ENCOUNTER — Encounter: Payer: Self-pay | Admitting: Podiatry

## 2020-07-13 DIAGNOSIS — M2041 Other hammer toe(s) (acquired), right foot: Secondary | ICD-10-CM

## 2020-07-13 DIAGNOSIS — G5762 Lesion of plantar nerve, left lower limb: Secondary | ICD-10-CM

## 2020-07-16 ENCOUNTER — Encounter: Payer: Medicare Other | Admitting: Podiatry

## 2020-07-16 ENCOUNTER — Other Ambulatory Visit: Payer: Self-pay

## 2020-07-19 ENCOUNTER — Ambulatory Visit (INDEPENDENT_AMBULATORY_CARE_PROVIDER_SITE_OTHER): Payer: Medicare Other

## 2020-07-19 ENCOUNTER — Encounter: Payer: Self-pay | Admitting: Podiatry

## 2020-07-19 ENCOUNTER — Other Ambulatory Visit (INDEPENDENT_AMBULATORY_CARE_PROVIDER_SITE_OTHER): Payer: Self-pay

## 2020-07-19 ENCOUNTER — Ambulatory Visit (INDEPENDENT_AMBULATORY_CARE_PROVIDER_SITE_OTHER): Payer: Medicare Other | Admitting: Podiatry

## 2020-07-19 ENCOUNTER — Other Ambulatory Visit: Payer: Self-pay

## 2020-07-19 DIAGNOSIS — D509 Iron deficiency anemia, unspecified: Secondary | ICD-10-CM

## 2020-07-19 DIAGNOSIS — D3610 Benign neoplasm of peripheral nerves and autonomic nervous system, unspecified: Secondary | ICD-10-CM

## 2020-07-19 DIAGNOSIS — G2581 Restless legs syndrome: Secondary | ICD-10-CM

## 2020-07-19 DIAGNOSIS — M2041 Other hammer toe(s) (acquired), right foot: Secondary | ICD-10-CM

## 2020-07-19 DIAGNOSIS — Z0289 Encounter for other administrative examinations: Secondary | ICD-10-CM

## 2020-07-19 DIAGNOSIS — G4762 Sleep related leg cramps: Secondary | ICD-10-CM

## 2020-07-20 LAB — IRON,TIBC AND FERRITIN PANEL
Ferritin: 65 ng/mL (ref 15–150)
Iron Saturation: 20 % (ref 15–55)
Iron: 73 ug/dL (ref 27–139)
Total Iron Binding Capacity: 363 ug/dL (ref 250–450)
UIBC: 290 ug/dL (ref 118–369)

## 2020-07-20 NOTE — Progress Notes (Signed)
Subjective:   Patient ID: Debra Matthews, female   DOB: 65 y.o.   MRN: [id number]   HPI Patient presents stating I am doing well overall with some discomfort in my right third toe from the dressing   ROS      Objective:  Physical Exam  Neurovascular status intact negative Bevelyn Buckles' sign noted wound edges coapted well good alignment third digit right neuroma site left     Assessment:  Doing well overall very satisfied with results     Plan:  H&P x-rays reviewed and discussed continued elevation immobilization compression with sterile dressings reapplied.  Reappoint 2 weeks suture move or earlier if needed  X-rays of the right indicated satisfactory resection of bone

## 2020-07-23 ENCOUNTER — Encounter: Payer: Self-pay | Admitting: Neurology

## 2020-07-23 NOTE — Progress Notes (Signed)
Normal ferritin and iron saturation is reached.

## 2020-07-24 ENCOUNTER — Encounter: Payer: Self-pay | Admitting: *Deleted

## 2020-07-24 ENCOUNTER — Encounter: Payer: Self-pay | Admitting: Podiatry

## 2020-08-02 ENCOUNTER — Other Ambulatory Visit: Payer: Self-pay

## 2020-08-02 ENCOUNTER — Ambulatory Visit (INDEPENDENT_AMBULATORY_CARE_PROVIDER_SITE_OTHER): Payer: Medicare Other | Admitting: Podiatry

## 2020-08-02 ENCOUNTER — Encounter: Payer: Self-pay | Admitting: Podiatry

## 2020-08-02 ENCOUNTER — Ambulatory Visit (INDEPENDENT_AMBULATORY_CARE_PROVIDER_SITE_OTHER): Payer: Medicare Other

## 2020-08-02 DIAGNOSIS — M2041 Other hammer toe(s) (acquired), right foot: Secondary | ICD-10-CM

## 2020-08-05 NOTE — Progress Notes (Signed)
Subjective:   Patient ID: Debra Matthews, female   DOB: 65 y.o.   MRN: [id number]   HPI Patient states doing well with surgery very pleased   ROS      Objective:  Physical Exam  Neurovascular status intact negative Bevelyn Buckles' sign noted wound edges well coapted right fourth toe good alignment noted     Assessment:  Doing well post arthroplasty digit 4 right neuroma excision left     Plan:  Stitches removed right sterile dressing reapplied begin gradual increase in activity reappoint to recheck  X-rays indicate satisfactory resection of bone right good alignment noted

## 2020-08-10 ENCOUNTER — Encounter: Payer: Self-pay | Admitting: Family Medicine

## 2020-08-10 MED ORDER — CEPHALEXIN 500 MG PO CAPS: 500.0000 mg | ORAL_CAPSULE | Freq: Two times a day (BID) | ORAL | 0 refills | Status: AC

## 2020-08-16 ENCOUNTER — Encounter: Payer: Self-pay | Admitting: Neurology

## 2020-09-03 ENCOUNTER — Institutional Professional Consult (permissible substitution): Payer: Medicare Other | Admitting: Neurology

## 2020-09-06 ENCOUNTER — Ambulatory Visit (INDEPENDENT_AMBULATORY_CARE_PROVIDER_SITE_OTHER): Payer: Medicare Other | Admitting: Neurology

## 2020-09-06 ENCOUNTER — Other Ambulatory Visit: Payer: Self-pay

## 2020-09-06 DIAGNOSIS — G4733 Obstructive sleep apnea (adult) (pediatric): Secondary | ICD-10-CM | POA: Diagnosis not present

## 2020-09-06 DIAGNOSIS — D509 Iron deficiency anemia, unspecified: Secondary | ICD-10-CM

## 2020-09-06 DIAGNOSIS — F429 Obsessive-compulsive disorder, unspecified: Secondary | ICD-10-CM

## 2020-09-06 DIAGNOSIS — G2581 Restless legs syndrome: Secondary | ICD-10-CM

## 2020-09-06 DIAGNOSIS — F5104 Psychophysiologic insomnia: Secondary | ICD-10-CM

## 2020-09-06 DIAGNOSIS — G4762 Sleep related leg cramps: Secondary | ICD-10-CM

## 2020-09-17 ENCOUNTER — Encounter: Payer: Self-pay | Admitting: Neurology

## 2020-09-17 ENCOUNTER — Other Ambulatory Visit: Payer: Self-pay | Admitting: Neurology

## 2020-09-17 DIAGNOSIS — D509 Iron deficiency anemia, unspecified: Secondary | ICD-10-CM

## 2020-09-17 DIAGNOSIS — F5104 Psychophysiologic insomnia: Secondary | ICD-10-CM

## 2020-09-17 DIAGNOSIS — G2581 Restless legs syndrome: Secondary | ICD-10-CM

## 2020-09-17 DIAGNOSIS — G4733 Obstructive sleep apnea (adult) (pediatric): Secondary | ICD-10-CM

## 2020-09-17 DIAGNOSIS — F429 Obsessive-compulsive disorder, unspecified: Secondary | ICD-10-CM

## 2020-09-17 DIAGNOSIS — G4762 Sleep related leg cramps: Secondary | ICD-10-CM

## 2020-09-17 NOTE — Procedures (Signed)
PATIENT'S NAME:  Jamaris, Biernat DOB:      07/21/1955      MR#:    [id number]     DATE OF RECORDING: 09/06/2020  Richard Miu.  REFERRING M.D.:  Dr Birdie Riddle  Study Performed:   Baseline Polysomnogram HISTORY: 05-29-2020:  KEAUNA BRASEL is a right -handed Caucasian female with Insomnia and RLS, and with severe muscle cramping.  She has a past medical history of Abdominal pain/ Epigastric, Anemia, Arthritis, Cancer ([place]), Chronic constipation, Gastritis/ GERD due to nonsteroidal anti-inflammatory drug (NSAID),  RLS (restless legs syndrome), unable to tolerate Requip and currently on Mirapex. Subchondral insufficiency fracture of femoral condyle, right, initial encounter ([place]) (07/15/2016). She has chronic insomnia and OCD tendencies. She described herself as a "clean freak".   The patient never had a sleep study. She had iron infusions in 2015 -after an anemia panel, was followed by hematology, attributed to ulcerative gastritis. The patient endorsed the Epworth Sleepiness Scale at 14/24 points.  FSS at 46/63 points. High!  GDS not indicated.  The patient's weight 144 pounds with a height of 62 (inches), resulting in a BMI of 26.4 kg/m2. The patient's neck circumference measured 14 inches.  CURRENT MEDICATIONS: Flonase, Mirapex, Valtrex, Benefiber, Restoril   PROCEDURE:  This is a multichannel digital polysomnogram utilizing the Somnostar 11.2 system.  Electrodes and sensors were applied and monitored per AASM Specifications.   EEG, EOG, Chin and Limb EMG, were sampled at 200 Hz.  ECG, Snore and Nasal Pressure, Thermal Airflow, Respiratory Effort, CPAP Flow and Pressure, Oximetry was sampled at 50 Hz. Digital video and audio were recorded.      BASELINE STUDY: Lights Out was at 21:06 and Lights On at 04:42.  Total recording time (TRT) was 456.5 minutes, with a total sleep time (TST) of 312 minutes. The patient's sleep latency was 26 minutes.  REM latency was 165 minutes.  The sleep efficiency was 68.3 %.      SLEEP ARCHITECTURE: WASO (Wake after sleep onset) was 118 minutes.  There were 13 minutes in Stage N1, 217.5 minutes Stage N2, 24 minutes Stage N3 and 57.5 minutes in Stage REM.  The percentage of Stage N1 was 4.2%, Stage N2 was 69.7%, Stage N3 was 7.7% and Stage R (REM sleep) was 18.4%.   RESPIRATORY ANALYSIS:  There were a total of 137 respiratory events:  59 obstructive apneas, 7 central apneas and 2 mixed apneas with 69 hypopneas. The patient also had additional respiratory event related arousals (RERAs).      The total APNEA/HYPOPNEA INDEX (AHI) was 26.3/hour and the total RESPIRATORY DISTURBANCE INDEX was 28.3 /hour.  37 events occurred in REM sleep and 120 events in NREM. The REM AHI was 38.6 /hour, versus a non-REM AHI of 23.6/h. The patient spent 266 minutes of total sleep time in the supine position and 46 minutes in non-supine. The supine AHI was 29.1/h versus a non-supine AHI of 10.4/h.  OXYGEN SATURATION & C02:  The Wake baseline 02 saturation was 94%, with the lowest being 81%. Time spent below 89% saturation equaled 14 minutes.  The arousals were noted as: 54 were spontaneous, 0 were associated with PLMs, 85 were associated with respiratory events. There were many intermittent short deoxygenation- events, these were clustered in REM sleep.  The patient had a total of 0 Periodic Limb Movements.   Audio and video analysis did not show any abnormal or unusual movements, behaviors, phonations or vocalizations.   The patient took 2 bathroom breaks. Snoring was  noted. EKG was in keeping with normal sinus rhythm (NSR).   IMPRESSION:  1. Moderate-Severity of Obstructive Sleep Apnea (OSA) at AHI 26/h and accentuated but not dependent on REM sleep and supine sleep.  2. Almost no limb movements in sleep were recorded.    RECOMMENDATIONS:  1. Advise full night, attended, CPAP titration study to optimize therapy.  Plan B would be to sue an autotitration device for a setting of 6-18 cm  water, 3 cm EPR and heated humidification with a mask of patient's choice.  2. I would be less optimistic about dental device or INSPIRE given the drops in oxygen during REM sleep. 3. Avoid supine sleep.  4. Continue sleeping in an adjustable bed with elevated head of bed.    I certify that I have reviewed the entire raw data recording prior to the issuance of this report in accordance with the Standards of Accreditation of the American Academy of Sleep Medicine (AASM)     Larey Seat, MD Diplomat, American Board of Neurology  Diplomat, American Board of Sleep Medicine Market researcher, [facility] [employer]

## 2020-09-17 NOTE — Progress Notes (Signed)
IMPRESSION:  1. Moderate-Severity of Obstructive Sleep Apnea (OSA) at AHI 26/h and accentuated but not dependent on REM sleep and supine sleep.  2. Almost no limb movements in sleep were recorded.    RECOMMENDATIONS:  1. Advise full night, attended, CPAP titration study to optimize therapy.  Plan B would be to use an autotitration CPAP device for a setting of 6-18 cm water, with 3 cm EPR and heated humidification with a mask of patient's choice.  2. I would be less optimistic about dental device or INSPIRE given the drops in oxygen during REM sleep. 3. Avoid supine sleep.  4. Continue sleeping in an adjustable bed with elevated head of bed.

## 2020-09-18 ENCOUNTER — Encounter: Payer: Self-pay | Admitting: Neurology

## 2020-09-18 NOTE — Progress Notes (Signed)
We can only count limb movements in sleep as PLMs- periodic limb movements.  The patient was clearly restless, did not sleep deep and well.  Most apnea noted when sleeping on the back .  The patient advised that she had more than2 bathroom breaks that night, CD.  I already ordered auto CPAP.

## 2020-09-19 ENCOUNTER — Telehealth: Payer: Self-pay

## 2020-09-19 NOTE — Telephone Encounter (Signed)
LVM for pt to call me back to schedule sleep study  

## 2020-09-24 ENCOUNTER — Encounter: Payer: Self-pay | Admitting: Neurology

## 2020-10-01 ENCOUNTER — Ambulatory Visit (INDEPENDENT_AMBULATORY_CARE_PROVIDER_SITE_OTHER): Payer: Medicare Other

## 2020-10-01 VITALS — Ht 62.0 in | Wt 144.0 lb

## 2020-10-01 DIAGNOSIS — Z8639 Personal history of other endocrine, nutritional and metabolic disease: Secondary | ICD-10-CM | POA: Diagnosis not present

## 2020-10-01 DIAGNOSIS — Z1231 Encounter for screening mammogram for malignant neoplasm of breast: Secondary | ICD-10-CM

## 2020-10-01 DIAGNOSIS — Z780 Asymptomatic menopausal state: Secondary | ICD-10-CM | POA: Diagnosis not present

## 2020-10-01 DIAGNOSIS — Z0000 Encounter for general adult medical examination without abnormal findings: Secondary | ICD-10-CM | POA: Diagnosis not present

## 2020-10-01 NOTE — Patient Instructions (Signed)
Debra Matthews , Thank you for taking time to complete your Medicare Wellness Visit. I appreciate your ongoing commitment to your health goals. Please review the following plan we discussed and let me know if I can assist you in the future.   Screening recommendations/referrals: Colonoscopy: Completed 03/21/2015-Due 03/20/2025 Mammogram: Ordered today.Someone will be calling you to schedule. Bone Density:  Ordered today.Someone will be calling you to schedule. Recommended yearly ophthalmology/optometry visit for glaucoma screening and checkup Recommended yearly dental visit for hygiene and checkup  Vaccinations: Influenza vaccine: Up to date Pneumococcal vaccine: Due-May obtain vaccines at your next office visit. Tdap vaccine: Up to date- Due-02/10/2026 Shingles vaccine: Completed vaccines  Covid-19:Completed vaccines  Advanced directives: Please bring a copy for your chart  Conditions/risks identified: See problem list  Next appointment: Follow up in one year for your annual wellness visit    Preventive Care 65 Years and Older, Female Preventive care refers to lifestyle choices and visits with your health care provider that can promote health and wellness. What does preventive care include?  A yearly physical exam. This is also called an annual well check.  Dental exams once or twice a year.  Routine eye exams. Ask your health care provider how often you should have your eyes checked.  Personal lifestyle choices, including:  Daily care of your teeth and gums.  Regular physical activity.  Eating a healthy diet.  Avoiding tobacco and drug use.  Limiting alcohol use.  Practicing safe sex.  Taking low-dose aspirin every day.  Taking vitamin and mineral supplements as recommended by your health care provider. What happens during an annual well check? The services and screenings done by your health care provider during your annual well check will depend on your age, overall  health, lifestyle risk factors, and family history of disease. Counseling  Your health care provider may ask you questions about your:  Alcohol use.  Tobacco use.  Drug use.  Emotional well-being.  Home and relationship well-being.  Sexual activity.  Eating habits.  History of falls.  Memory and ability to understand (cognition).  Work and work Statistician.  Reproductive health. Screening  You may have the following tests or measurements:  Height, weight, and BMI.  Blood pressure.  Lipid and cholesterol levels. These may be checked every 5 years, or more frequently if you are over 68 years old.  Skin check.  Lung cancer screening. You may have this screening every year starting at age 21 if you have a 30-pack-year history of smoking and currently smoke or have quit within the past 15 years.  Fecal occult blood test (FOBT) of the stool. You may have this test every year starting at age 66.  Flexible sigmoidoscopy or colonoscopy. You may have a sigmoidoscopy every 5 years or a colonoscopy every 10 years starting at age 32.  Hepatitis C blood test.  Hepatitis B blood test.  Sexually transmitted disease (STD) testing.  Diabetes screening. This is done by checking your blood sugar (glucose) after you have not eaten for a while (fasting). You may have this done every 1-3 years.  Bone density scan. This is done to screen for osteoporosis. You may have this done starting at age 37.  Mammogram. This may be done every 1-2 years. Talk to your health care provider about how often you should have regular mammograms. Talk with your health care provider about your test results, treatment options, and if necessary, the need for more tests. Vaccines  Your health care provider may  recommend certain vaccines, such as:  Influenza vaccine. This is recommended every year.  Tetanus, diphtheria, and acellular pertussis (Tdap, Td) vaccine. You may need a Td booster every 10  years.  Zoster vaccine. You may need this after age 16.  Pneumococcal 13-valent conjugate (PCV13) vaccine. One dose is recommended after age 41.  Pneumococcal polysaccharide (PPSV23) vaccine. One dose is recommended after age 56. Talk to your health care provider about which screenings and vaccines you need and how often you need them. This information is not intended to replace advice given to you by your health care provider. Make sure you discuss any questions you have with your health care provider. Document Released: 06/29/2015 Document Revised: 02/20/2016 Document Reviewed: 04/03/2015 Elsevier Interactive Patient Education  2017 [place] Prevention in the Home Falls can cause injuries. They can happen to people of all ages. There are many things you can do to make your home safe and to help prevent falls. What can I do on the outside of my home?  Regularly fix the edges of walkways and driveways and fix any cracks.  Remove anything that might make you trip as you walk through a door, such as a raised step or threshold.  Trim any bushes or trees on the path to your home.  Use bright outdoor lighting.  Clear any walking paths of anything that might make someone trip, such as rocks or tools.  Regularly check to see if handrails are loose or broken. Make sure that both sides of any steps have handrails.  Any raised decks and porches should have guardrails on the edges.  Have any leaves, snow, or ice cleared regularly.  Use sand or salt on walking paths during winter.  Clean up any spills in your garage right away. This includes oil or grease spills. What can I do in the bathroom?  Use night lights.  Install grab bars by the toilet and in the tub and shower. Do not use towel bars as grab bars.  Use non-skid mats or decals in the tub or shower.  If you need to sit down in the shower, use a plastic, non-slip stool.  Keep the floor dry. Clean up any water that  spills on the floor as soon as it happens.  Remove soap buildup in the tub or shower regularly.  Attach bath mats securely with double-sided non-slip rug tape.  Do not have throw rugs and other things on the floor that can make you trip. What can I do in the bedroom?  Use night lights.  Make sure that you have a light by your bed that is easy to reach.  Do not use any sheets or blankets that are too big for your bed. They should not hang down onto the floor.  Have a firm chair that has side arms. You can use this for support while you get dressed.  Do not have throw rugs and other things on the floor that can make you trip. What can I do in the kitchen?  Clean up any spills right away.  Avoid walking on wet floors.  Keep items that you use a lot in easy-to-reach places.  If you need to reach something above you, use a strong step stool that has a grab bar.  Keep electrical cords out of the way.  Do not use floor polish or wax that makes floors slippery. If you must use wax, use non-skid floor wax.  Do not have throw rugs and  other things on the floor that can make you trip. What can I do with my stairs?  Do not leave any items on the stairs.  Make sure that there are handrails on both sides of the stairs and use them. Fix handrails that are broken or loose. Make sure that handrails are as long as the stairways.  Check any carpeting to make sure that it is firmly attached to the stairs. Fix any carpet that is loose or worn.  Avoid having throw rugs at the top or bottom of the stairs. If you do have throw rugs, attach them to the floor with carpet tape.  Make sure that you have a light switch at the top of the stairs and the bottom of the stairs. If you do not have them, ask someone to add them for you. What else can I do to help prevent falls?  Wear shoes that:  Do not have high heels.  Have rubber bottoms.  Are comfortable and fit you well.  Are closed at the  toe. Do not wear sandals.  If you use a stepladder:  Make sure that it is fully opened. Do not climb a closed stepladder.  Make sure that both sides of the stepladder are locked into place.  Ask someone to hold it for you, if possible.  Clearly mark and make sure that you can see:  Any grab bars or handrails.  First and last steps.  Where the edge of each step is.  Use tools that help you move around (mobility aids) if they are needed. These include:  Canes.  Walkers.  Scooters.  Crutches.  Turn on the lights when you go into a dark area. Replace any light bulbs as soon as they burn out.  Set up your furniture so you have a clear path. Avoid moving your furniture around.  If any of your floors are uneven, fix them.  If there are any pets around you, be aware of where they are.  Review your medicines with your doctor. Some medicines can make you feel dizzy. This can increase your chance of falling. Ask your doctor what other things that you can do to help prevent falls. This information is not intended to replace advice given to you by your health care provider. Make sure you discuss any questions you have with your health care provider. Document Released: 03/29/2009 Document Revised: 11/08/2015 Document Reviewed: 07/07/2014 Elsevier Interactive Patient Education  2017 [employer].

## 2020-10-01 NOTE — Progress Notes (Signed)
Subjective:   Debra Matthews is a 65 y.o. female who presents for Medicare Annual (Subsequent) preventive examination.  I connected with Debra Matthews today by telephone and verified that I am speaking with the correct person using two identifiers. Location patient: home Location provider: work Persons participating in the virtual visit: patient, Marine scientist.    I discussed the limitations, risks, security and privacy concerns of performing an evaluation and management service by telephone and the availability of in person appointments. I also discussed with the patient that there may be a patient responsible charge related to this service. The patient expressed understanding and verbally consented to this telephonic visit.    Interactive audio and video telecommunications were attempted between this provider and patient, however failed, due to patient having technical difficulties OR patient did not have access to video capability.  We continued and completed visit with audio only.  Some vital signs may be absent or patient reported.   Time Spent with patient on telephone encounter: 30 minutes   Review of Systems     Cardiac Risk Factors include: dyslipidemia;sedentary lifestyle     Objective:    Today's Vitals   10/01/20 1416  Weight: 144 lb (65.3 kg)  Height: 5\' 2"  (1.575 m)   Body mass index is 26.34 kg/m.  Advanced Directives 10/01/2020 05/18/2017 05/18/2017 05/13/2017  Does Patient Have a Medical Advance Directive? Yes - Yes Yes  Type of Paramedic of [place];Living will [place];Living will [place];Living will [place]  Does patient want to make changes to medical advance directive? - No - Patient declined - No - Patient declined  Copy of [place] in Chart? No - copy requested No - copy requested No - copy requested No - copy requested    Current Medications (verified) Outpatient  Encounter Medications as of 10/01/2020  Medication Sig  . clobetasol ointment (TEMOVATE) 6.37 % Apply 1 application topically 2 (two) times daily as needed for rash.  . fluticasone (FLONASE) 50 MCG/ACT nasal spray SPRAY 2 SPRAYS INTO EACH NOSTRIL EVERY DAY  . pramipexole (MIRAPEX) 1 MG tablet TAKE 1 TABLET (1 MG TOTAL) BY MOUTH 3 (THREE) TIMES DAILY. (Patient taking differently: Take 1.5-3 mg by mouth daily. 1.5 tablet daily with additional 1.5 tab as needed for break-through)  . traZODone (DESYREL) 50 MG tablet Take 1 tablet (50 mg total) by mouth at bedtime as needed for sleep.  Marland Kitchen tretinoin (RETIN-A) 0.025 % cream APPLY ON THE SKIN AT BEDTIME APPLY TO FACE AT BEDTIME AS NEEDED FOR ACNE  . valACYclovir (VALTREX) 500 MG tablet Take 1 tablet (500 mg total) by mouth 2 (two) times daily as needed (cold sores).  . Wheat Dextrin (BENEFIBER PO) Take 1 scoop by mouth 2 (two) times daily as needed (constipation).   . temazepam (RESTORIL) 7.5 MG capsule Take 1 capsule (7.5 mg total) by mouth at bedtime as needed for sleep. (Patient not taking: Reported on 05/29/2020)   No facility-administered encounter medications on file as of 10/01/2020.    Allergies (verified) Pseudoephedrine and Prednisone   History: Past Medical History:  Diagnosis Date  . Abdominal pain, epigastric   . Anemia    hx of iron def.  . Arthritis   . Cancer ([place])    non melanoma skin cancer  chest  . Chronic constipation    in past  . Cyst in hand    right wrist  . 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 GERD (gastroesophageal reflux disease)   . Headache    hx of   . History of chicken pox   . History of shingles   . RLS (restless legs syndrome)   . Subchondral insufficiency fracture of femoral condyle, right, initial encounter ([place]) 07/15/2016   Past Surgical History:  Procedure Laterality Date  . KNEE ARTHROSCOPY WITH MENISCAL REPAIR Right 05/18/2017   Procedure: KNEE ARTHROSCOPY WITH PARTIAL  LATERAL MENISCECTOMY;  Surgeon: Paralee Cancel, MD;  Location: [facility];  Service: Orthopedics;  Laterality: Right;  Adductor Block  . MEDIAL PARTIAL KNEE REPLACEMENT Right 05/18/2017   Procedure: RIGHT MEDIAL PARTIAL KNEE REPLACEMENT;  Surgeon: Paralee Cancel, MD;  Location: [facility];  Service: Orthopedics;  Laterality: Right;  Adductor Block  . TUBAL LIGATION     Family History  Problem Relation Age of Onset  . Scoliosis Mother   . Mitral valve prolapse Mother   . Colon cancer Maternal Aunt    Social History   Socioeconomic History  . Marital status: Married    Spouse name: Not on file  . Number of children: 3  . Years of education: Not on file  . Highest education level: Not on file  Occupational History  . Occupation: retired  Tobacco Use  . Smoking status: Never Smoker  . Smokeless tobacco: Never Used  Vaping Use  . Vaping Use: Never used  Substance and Sexual Activity  . Alcohol use: No    Alcohol/week: 0.0 standard drinks  . Drug use: No  . Sexual activity: Yes  Other Topics Concern  . Not on file  Social History Narrative  . Not on file   Social Determinants of Health   Financial Resource Strain: Low Risk   . Difficulty of Paying Living Expenses: Not hard at all  Food Insecurity: No Food Insecurity  . Worried About Charity fundraiser in the Last Year: Never true  . Ran Out of Food in the Last Year: Never true  Transportation Needs: No Transportation Needs  . Lack of Transportation (Medical): No  . Lack of Transportation (Non-Medical): No  Physical Activity: Inactive  . Days of Exercise per Week: 0 days  . Minutes of Exercise per Session: 0 min  Stress: No Stress Concern Present  . Feeling of Stress : Not at all  Social Connections: Moderately Isolated  . Frequency of Communication with Friends and Family: More than three times a week  . Frequency of Social Gatherings with Friends and Family: More than three times a week  . Attends Religious Services: Never  .  Active Member of Clubs or Organizations: No  . Attends Archivist Meetings: Never  . Marital Status: Married    Tobacco Counseling Counseling given: Not Answered   Clinical Intake:  Pre-visit preparation completed: Yes  Pain : No/denies pain     Nutritional Status: BMI 25 -29 Overweight Nutritional Risks: None Diabetes: No  How often do you need to have someone help you when you read instructions, pamphlets, or other written materials from your doctor or pharmacy?: 1 - Never  Diabetic?No  Interpreter Needed?: No  Information entered by :: Caroleen Hamman LPN   Activities of Daily Living In your present state of health, do you have any difficulty performing the following activities: 10/01/2020 05/18/2020  Hearing? N N  Vision? N N  Difficulty concentrating or making decisions? N N  Walking or climbing stairs? N N  Dressing or bathing? N N  Doing  errands, shopping? - N  Conservation officer, nature and eating ? N -  Using the Toilet? N -  In the past six months, have you accidently leaked urine? N -  Do you have problems with loss of bowel control? N -  Managing your Medications? N -  Managing your Finances? N -  Housekeeping or managing your Housekeeping? N -  Some recent data might be hidden    Patient Care Team: Midge Minium, MD as PCP - General (Family Medicine) Jacquiline Doe, Marko Stai, MD as Consulting Physician (Internal Medicine) Vanessa Kick, MD as Consulting Physician (Obstetrics and Gynecology)  Indicate any recent Medical Services you may have received from other than [facility] providers in the past year (date may be approximate).     Assessment:   This is a routine wellness examination for Younique.  Hearing/Vision screen  Hearing Screening   125Hz  250Hz  500Hz  1000Hz  2000Hz  3000Hz  4000Hz  6000Hz  8000Hz   Right ear:           Left ear:           Comments: No issues  Vision Screening Comments: Wears reading glasses Last eye exam-08/2020-Dr.Cotter  Dietary  issues and exercise activities discussed: Current Exercise Habits: The patient does not participate in regular exercise at present, Exercise limited by: None identified  Goals    . Patient Stated     Lose 8 pounds & drink more water      Depression Screen PHQ 2/9 Scores 10/01/2020 05/18/2020 11/18/2019 06/15/2019 05/19/2019 09/22/2018 06/23/2018  PHQ - 2 Score 0 0 0 1 1 0 0  PHQ- 9 Score - 0 0 1 1 - 0    Fall Risk Fall Risk  10/01/2020 05/18/2020 11/18/2019 06/15/2019 05/19/2019  Falls in the past year? 1 0 0 0 0  Number falls in past yr: 0 0 0 0 0  Injury with Fall? 0 0 0 0 0  Risk for fall due to : - No Fall Risks - - -  Follow up Falls prevention discussed - Falls evaluation completed Falls evaluation completed Falls evaluation completed    [place]:  Any stairs in or around the home? Yes  If so, are there any without handrails? No  Home free of loose throw rugs in walkways, pet beds, electrical cords, etc? Yes  Adequate lighting in your home to reduce risk of falls? Yes   ASSISTIVE DEVICES UTILIZED TO PREVENT FALLS:  Life alert? No  Use of a cane, walker or w/c? No  Grab bars in the bathroom? Yes  Shower chair or bench in shower? No  Elevated toilet seat or a handicapped toilet? No   TIMED UP AND GO:  Was the test performed? No . phone   Cognitive Function:Normal cognitive status assessed by  this Nurse Health Advisor. No abnormalities found.          Immunizations Immunization History  Administered Date(s) Administered  . Fluad Quad(high Dose 65+) 05/18/2020  . Influenza,inj,Quad PF,6+ Mos 06/11/2016, 05/19/2017, 05/19/2018, 05/19/2019  . Influenza-Unspecified 03/16/2016  . Moderna Sars-Covid-2 Vaccination 09/01/2019, 10/10/2019, 04/21/2020  . Tdap 02/11/2016  . Zoster Recombinat (Shingrix) 06/12/2017, 09/17/2017    TDAP status: Up to date  Flu Vaccine status: Up to date  Pneumococcal vaccine status: Due, Education has been  provided regarding the importance of this vaccine. Advised may receive this vaccine at local pharmacy or Health Dept. Aware to provide a copy of the vaccination record if obtained from local pharmacy or Health Dept.  Verbalized acceptance and understanding.  Covid-19 vaccine status: Completed vaccines  Qualifies for Shingles Vaccine? No   Zostavax completed No   Shingrix Completed?: Yes  Screening Tests Health Maintenance  Topic Date Due  . PNA vac Low Risk Adult (1 of 2 - PCV13) Never done  . Hepatitis C Screening  11/17/2020 (Originally [birth year])  . HIV Screening  11/17/2020 (Originally 10/08/1970)  . INFLUENZA VACCINE  01/14/2021  . MAMMOGRAM  02/02/2021  . PAP SMEAR-Modifier  02/02/2022  . COLONOSCOPY (Pts 45-12yrs Insurance coverage will need to be confirmed)  03/20/2025  . TETANUS/TDAP  02/10/2026  . DEXA SCAN  Completed  . COVID-19 Vaccine  Completed  . HPV VACCINES  Aged Out    Health Maintenance  Health Maintenance Due  Topic Date Due  . PNA vac Low Risk Adult (1 of 2 - PCV13) Never done    Colorectal cancer screening: Type of screening: Colonoscopy. Completed 03/21/2015. Repeat every 10 years  Mammogram status: Ordered today. Pt provided with contact info and advised to call to schedule appt.   Bone Density status: Ordered today. Pt provided with contact info and advised to call to schedule appt.  Lung Cancer Screening: (Low Dose CT Chest recommended if Age 27-80 years, 30 pack-year currently smoking OR have quit w/in 15years.) does not qualify.    Additional Screening:  Hepatitis C Screening: does qualify; Declined  Vision Screening: Recommended annual ophthalmology exams for early detection of glaucoma and other disorders of the eye. Is the patient up to date with their annual eye exam?  Yes  Who is the provider or what is the name of the office in which the patient attends annual eye exams? Dr. Jorja Loa   Dental Screening: Recommended annual dental exams for  proper oral hygiene  Community Resource Referral / Chronic Care Management: CRR required this visit?  No   CCM required this visit?  No      Plan:     I have personally reviewed and noted the following in the patient's chart:   . Medical and social history . Use of alcohol, tobacco or illicit drugs  . Current medications and supplements . Functional ability and status . Nutritional status . Physical activity . Advanced directives . List of other physicians . Hospitalizations, surgeries, and ER visits in previous 12 months . Vitals . Screenings to include cognitive, depression, and falls . Referrals and appointments  In addition, I have reviewed and discussed with patient certain preventive protocols, quality metrics, and best practice recommendations. A written personalized care plan for preventive services as well as general preventive health recommendations were provided to patient.   Due to this being a telephonic visit, the after visit summary with patients personalized plan was offered to patient via mail or my-chart. Patient would like to access on my-chart.   Marta Antu, LPN   [id number]  Nurse Health Advisor  Nurse Notes: None

## 2020-10-09 ENCOUNTER — Encounter: Payer: Self-pay | Admitting: Registered Nurse

## 2020-10-09 ENCOUNTER — Ambulatory Visit (INDEPENDENT_AMBULATORY_CARE_PROVIDER_SITE_OTHER): Payer: Medicare Other | Admitting: Registered Nurse

## 2020-10-09 ENCOUNTER — Other Ambulatory Visit: Payer: Self-pay

## 2020-10-09 VITALS — Temp 97.8°F | Wt 144.0 lb

## 2020-10-09 DIAGNOSIS — J019 Acute sinusitis, unspecified: Secondary | ICD-10-CM | POA: Diagnosis not present

## 2020-10-09 DIAGNOSIS — R059 Cough, unspecified: Secondary | ICD-10-CM | POA: Diagnosis not present

## 2020-10-09 DIAGNOSIS — B9689 Other specified bacterial agents as the cause of diseases classified elsewhere: Secondary | ICD-10-CM | POA: Diagnosis not present

## 2020-10-09 MED ORDER — AZELASTINE HCL 0.1 % NA SOLN: 1.0000 | Freq: Two times a day (BID) | NASAL | 12 refills | Status: DC

## 2020-10-09 MED ORDER — BENZONATATE 100 MG PO CAPS: 100.0000 mg | ORAL_CAPSULE | Freq: Two times a day (BID) | ORAL | 0 refills | Status: DC | PRN

## 2020-10-09 MED ORDER — AMOXICILLIN-POT CLAVULANATE 875-125 MG PO TABS: 1.0000 | ORAL_TABLET | Freq: Two times a day (BID) | ORAL | 0 refills | Status: DC

## 2020-10-09 NOTE — Patient Instructions (Signed)
° ° ° °  If you have lab work done today you will be contacted with your lab results within the next 2 weeks.  If you have not heard from us then please contact us. The fastest way to get your results is to register for My Chart. ° ° °IF you received an x-ray today, you will receive an invoice from [facility]. Please contact [facility] at [phone] with questions or concerns regarding your invoice.  ° °IF you received labwork today, you will receive an invoice from [facility]. Please contact [facility] at [phone] with questions or concerns regarding your invoice.  ° °Our billing staff will not be able to assist you with questions regarding bills from these companies. ° °You will be contacted with the lab results as soon as they are available. The fastest way to get your results is to activate your My Chart account. Instructions are located on the last page of this paperwork. If you have not heard from us regarding the results in 2 weeks, please contact this office. °  ° ° ° °

## 2020-10-22 ENCOUNTER — Other Ambulatory Visit: Payer: Self-pay

## 2020-10-22 ENCOUNTER — Encounter: Payer: Self-pay | Admitting: Family Medicine

## 2020-10-22 ENCOUNTER — Telehealth (INDEPENDENT_AMBULATORY_CARE_PROVIDER_SITE_OTHER): Payer: Medicare Other | Admitting: Family Medicine

## 2020-10-22 VITALS — BP 132/77 | HR 76 | Wt 141.0 lb

## 2020-10-22 DIAGNOSIS — J301 Allergic rhinitis due to pollen: Secondary | ICD-10-CM | POA: Diagnosis not present

## 2020-10-22 MED ORDER — CETIRIZINE HCL 10 MG PO TABS: 10.0000 mg | ORAL_TABLET | Freq: Every day | ORAL | 11 refills | Status: DC

## 2020-10-22 MED ORDER — PREDNISONE 10 MG PO TABS: ORAL_TABLET | ORAL | 0 refills | Status: DC

## 2020-10-22 NOTE — Progress Notes (Signed)
I connected with  Debra Matthews on 10/22/20 by a video enabled telemedicine application and verified that I am speaking with the correct person using two identifiers.   I discussed the limitations of evaluation and management by telemedicine. The patient expressed understanding and agreed to proceed.

## 2020-10-22 NOTE — Progress Notes (Signed)
Virtual Visit via Video   I connected with patient on 10/22/20 at  9:30 AM EDT by a video enabled telemedicine application and verified that I am speaking with the correct person using two identifiers.  Location patient: Home Location provider: [facility], Office Persons participating in the virtual visit: Patient, Provider, [place] (Sabrina M)  I discussed the limitations of evaluation and management by telemedicine and the availability of in person appointments. The patient expressed understanding and agreed to proceed.  Subjective:   HPI:   Sore throat- pt was seen on 4/26 for cough.  Treated w/ Augmentin and completed 10 day course.  Woke w/ sore throat on R side and R ear pain 'that was out of this world'.  + PND, HA.  Pt has been taking OTC cough and cold medications w/o relief.  ROS:   See pertinent positives and negatives per HPI.  Patient Active Problem List   Diagnosis Date Noted  . Chronic insomnia 05/29/2020  . Obsessive-compulsive disorder 05/29/2020  . Iron deficiency anemia 05/29/2020  . Sleep related leg cramps 05/29/2020  . PND (post-nasal drip) 05/19/2019  . S/P right unicompartmental knee replacement 05/18/2017  . Hyperlipidemia 05/06/2017  . Physical exam 08/13/2016  . Osteoporosis 08/13/2016  . Carpal tunnel syndrome, bilateral 08/13/2016  . Insomnia 08/13/2016  . Subchondral insufficiency fracture of femoral condyle, right, initial encounter ([place]) 07/15/2016  . Anxiety state 03/17/2016  . RLS (restless legs syndrome) 02/11/2016  . GERD (gastroesophageal reflux disease) 02/11/2016  . Migraines 02/11/2016    Social History   Tobacco Use  . Smoking status: Never Smoker  . Smokeless tobacco: Never Used  Substance Use Topics  . Alcohol use: No    Alcohol/week: 0.0 standard drinks    Current Outpatient Medications:  .  clobetasol ointment (TEMOVATE) 3.71 %, Apply 1 application topically 2 (two) times daily as needed for rash., Disp: , Rfl:  2 .  fluticasone (FLONASE) 50 MCG/ACT nasal spray, SPRAY 2 SPRAYS INTO EACH NOSTRIL EVERY DAY, Disp: 48 mL, Rfl: 2 .  pramipexole (MIRAPEX) 1 MG tablet, TAKE 1 TABLET (1 MG TOTAL) BY MOUTH 3 (THREE) TIMES DAILY. (Patient taking differently: Take 1.5-3 mg by mouth daily. 1.5 tablet daily with additional 1.5 tab as needed for break-through), Disp: 270 tablet, Rfl: 1 .  traZODone (DESYREL) 50 MG tablet, Take 1 tablet (50 mg total) by mouth at bedtime as needed for sleep., Disp: 90 tablet, Rfl: 1 .  Wheat Dextrin (BENEFIBER PO), Take 1 scoop by mouth 2 (two) times daily as needed (constipation). , Disp: , Rfl:  .  amoxicillin-clavulanate (AUGMENTIN) 875-125 MG tablet, Take 1 tablet by mouth 2 (two) times daily. (Patient not taking: Reported on 10/22/2020), Disp: 20 tablet, Rfl: 0 .  azelastine (ASTELIN) 0.1 % nasal spray, Place 1 spray into both nostrils 2 (two) times daily. Use in each nostril as directed (Patient not taking: Reported on 10/22/2020), Disp: 30 mL, Rfl: 12 .  benzonatate (TESSALON) 100 MG capsule, Take 1 capsule (100 mg total) by mouth 2 (two) times daily as needed for cough. (Patient not taking: Reported on 10/22/2020), Disp: 20 capsule, Rfl: 0 .  temazepam (RESTORIL) 7.5 MG capsule, Take 1 capsule (7.5 mg total) by mouth at bedtime as needed for sleep. (Patient not taking: Reported on 05/29/2020), Disp: 30 capsule, Rfl: 3 .  tretinoin (RETIN-A) 0.025 % cream, APPLY ON THE SKIN AT BEDTIME APPLY TO FACE AT BEDTIME AS NEEDED FOR ACNE (Patient not taking: No sig reported), Disp: ,  Rfl: 3 .  valACYclovir (VALTREX) 500 MG tablet, Take 1 tablet (500 mg total) by mouth 2 (two) times daily as needed (cold sores). (Patient not taking: No sig reported), Disp: 180 tablet, Rfl: 0  Allergies  Allergen Reactions  . Pseudoephedrine Other (See Comments)    Insomnia  . Prednisone     Pt feels bad, headache, no appetite.    Objective:   BP 132/77   Pulse 76   Wt 141 lb (64 kg)   BMI 25.79 kg/m   AAOx3, NAD NCAT, EOMI No obvious CN deficits Coloring WNL Pt is able to speak clearly, coherently without shortness of breath or increased work of breathing.  Thought process is linear.  Mood is appropriate.   Assessment and Plan:   Allergic rhinitis- new.  Pt's 4 weeks of sxs are most likely due to untreated/under-treated seasonal allergies.  Restart daily antihistamine.  She has nasacort at home- encouraged her to use daily.  Start Predisone taper to improve sinus inflammation/congestion/drainage.  She doesn't have a true prednisone allergy, just says it makes her feel 'really bad'.  She states she doesn't feel good at this point so she's willing to try.  Reviewed supportive care and red flags that should prompt return.  Pt expressed understanding and is in agreement w/ plan.    Annye Asa, MD 10/22/2020

## 2020-10-25 ENCOUNTER — Ambulatory Visit (HOSPITAL_COMMUNITY)
Admission: RE | Admit: 2020-10-25 | Discharge: 2020-10-25 | Disposition: A | Discharge status: Home / Self Care | Payer: Medicare Other | Source: Ambulatory Visit | Attending: Family Medicine | Admitting: Family Medicine

## 2020-10-25 DIAGNOSIS — Z780 Asymptomatic menopausal state: Secondary | ICD-10-CM

## 2020-10-25 DIAGNOSIS — Z1231 Encounter for screening mammogram for malignant neoplasm of breast: Secondary | ICD-10-CM | POA: Insufficient documentation

## 2020-10-29 ENCOUNTER — Encounter: Payer: Self-pay | Admitting: Family Medicine

## 2020-11-16 ENCOUNTER — Inpatient Hospital Stay
Admit: 2020-11-16 | Discharge: 2020-11-16 | Disposition: A | Discharge status: Home / Self Care | Payer: MEDICARE | Attending: Emergency Medicine

## 2020-11-16 DIAGNOSIS — N3001 Acute cystitis with hematuria: Secondary | ICD-10-CM

## 2020-11-16 LAB — URINE MICROSCOPIC

## 2020-11-16 LAB — URINALYSIS W/ RFLX MICROSCOPIC
Bilirubin, Urine: NEGATIVE
Bilirubin: NEGATIVE
Glucose, Ur: NEGATIVE mg/dL
Glucose: NEGATIVE mg/dL
Ketone: NEGATIVE mg/dL
Ketones, Urine: NEGATIVE mg/dL
Nitrite, Urine: POSITIVE — AB
Nitrites: POSITIVE — AB
Protein, UA: NEGATIVE mg/dL
Protein: NEGATIVE mg/dL
Specific Gravity, UA: 1.023 (ref 1.005–1.030)
Specific gravity: 1.023 (ref 1.005–1.030)
Urobilinogen, UA, POCT: 0.2 EU/dL (ref 0.2–1.0)
Urobilinogen: 0.2 EU/dL (ref 0.2–1.0)
pH (UA): 5.5 (ref 5.0–8.0)
pH, UA: 5.5 (ref 5.0–8.0)

## 2020-11-16 MED ORDER — PHENAZOPYRIDINE 200 MG TAB
200 mg | ORAL_TABLET | Freq: Three times a day (TID) | ORAL | 0 refills | Status: AC
Start: 2020-11-16 — End: 2020-11-18

## 2020-11-16 MED ORDER — CEFDINIR 300 MG CAP
300 mg | ORAL_CAPSULE | Freq: Two times a day (BID) | ORAL | 0 refills | Status: AC
Start: 2020-11-16 — End: ?

## 2020-11-16 NOTE — ED Provider Notes (Signed)
Patient states throughout the night she had urinary frequency and burning with urination and mild suprapubic pain.  Symptoms were preceded the prior day by pinkish discoloration to urine and chills.  Patient denies any flank pain, nausea, vomiting or other complaints below.    The history is provided by the patient.   Urinary Pain   This is a new problem. The current episode started yesterday. The problem occurs every urination. The problem has not changed since onset.The quality of the pain is described as burning. The pain is moderate. There has been no fever. She is not sexually active. Associated symptoms include frequency and hematuria. Pertinent negatives include no chills, no sweats, no nausea, no vomiting and no abdominal pain. She has tried nothing for the symptoms.        Past Medical History:   Diagnosis Date   ??? Atrial fibrillation (HCC)    ??? Hypertension        Past Surgical History:   Procedure Laterality Date   ??? HX HIP REPLACEMENT  2020    Right Hip         History reviewed. No pertinent family history.    Social History     Socioeconomic History   ??? Marital status: MARRIED     Spouse name: Not on file   ??? Number of children: Not on file   ??? Years of education: Not on file   ??? Highest education level: Not on file   Occupational History   ??? Not on file   Tobacco Use   ??? Smoking status: Never Smoker   ??? Smokeless tobacco: Never Used   Substance and Sexual Activity   ??? Alcohol use: Never   ??? Drug use: Never   ??? Sexual activity: Not on file   Other Topics Concern   ??? Not on file   Social History Narrative   ??? Not on file     Social Determinants of Health     Financial Resource Strain:    ??? Difficulty of Paying Living Expenses: Not on file   Food Insecurity:    ??? Worried About Running Out of Food in the Last Year: Not on file   ??? Ran Out of Food in the Last Year: Not on file   Transportation Needs:    ??? Lack of Transportation (Medical): Not on file   ??? Lack of Transportation (Non-Medical): Not on file    Physical Activity:    ??? Days of Exercise per Week: Not on file   ??? Minutes of Exercise per Session: Not on file   Stress:    ??? Feeling of Stress : Not on file   Social Connections:    ??? Frequency of Communication with Friends and Family: Not on file   ??? Frequency of Social Gatherings with Friends and Family: Not on file   ??? Attends Religious Services: Not on file   ??? Active Member of Clubs or Organizations: Not on file   ??? Attends Banker Meetings: Not on file   ??? Marital Status: Not on file   Intimate Partner Violence: Not At Risk   ??? Fear of Current or Ex-Partner: No   ??? Emotionally Abused: No   ??? Physically Abused: No   ??? Sexually Abused: No   Housing Stability:    ??? Unable to Pay for Housing in the Last Year: Not on file   ??? Number of Places Lived in the Last Year: Not on file   ???  Unstable Housing in the Last Year: Not on file         ALLERGIES: Clavulanic acid    Review of Systems   Constitutional: Negative for chills.   Gastrointestinal: Negative for abdominal pain, nausea and vomiting.   Genitourinary: Positive for frequency and hematuria.   All other systems reviewed and are negative.      Vitals:    11/16/20 0941   BP: (!) 157/92   Pulse: 85   Resp: 16   Temp: 97.4 ??F (36.3 ??C)   SpO2: 97%   Weight: 83.5 kg (184 lb)   Height: 5\' 9"  (1.753 m)            Physical Exam  Vitals and nursing note reviewed.   Constitutional:       General: She is awake.      Appearance: She is not toxic-appearing.   HENT:      Head: Normocephalic.      Nose: Nose normal.      Mouth/Throat:      Mouth: Mucous membranes are moist.   Eyes:      Extraocular Movements: Extraocular movements intact.      Conjunctiva/sclera: Conjunctivae normal.   Neck:      Trachea: Phonation normal. No tracheal deviation.   Cardiovascular:      Rate and Rhythm: Normal rate.   Pulmonary:      Effort: Pulmonary effort is normal. No respiratory distress.      Breath sounds: No stridor.   Abdominal:      General: Abdomen is flat. There is no  distension.   Musculoskeletal:         General: Normal range of motion.   Skin:     General: Skin is dry.      Coloration: Skin is not cyanotic.   Neurological:      Mental Status: She is alert. Mental status is at baseline.      Cranial Nerves: No facial asymmetry.   Psychiatric:         Attention and Perception: Attention normal.         Behavior: Behavior is cooperative.          MDM  Number of Diagnoses or Management Options  Acute cystitis with hematuria  Diagnosis management comments:     Patient presenting with dysuria.  Denies back or flank pain.  Will treat for cystitis.  I advised patient to return to ER immediately if they experience flank pain, continued dysuria despite 48 hours of antibiotics, fevers, abdominal pain, nausea, vomiting or any new or concerning symptoms.  Patient agrees with plan.       Amount and/or Complexity of Data Reviewed  Clinical lab tests: reviewed           Procedures

## 2020-11-16 NOTE — ED Notes (Signed)
 Physical assessment completed. The patient's level of consciousness is alert and oriented x 3. The patient's mood is calm and cooperative. The patient's appearance shows neat and dressed appropriate to the season. The patient does not  indicate signs or symptoms of abuse or neglect.

## 2020-11-16 NOTE — ED Notes (Signed)
C/o urinary frequency x 3 days and last night got worse; pt was up in the bathroom many time through the night. C/o low abd pain.

## 2020-11-16 NOTE — ED Notes (Signed)
D/c instructions explained, educated to increase water intake. Pt verbalized understanding.

## 2020-11-18 LAB — CULTURE, URINE: Culture result:: >100000 — AB

## 2020-12-05 ENCOUNTER — Other Ambulatory Visit (HOSPITAL_COMMUNITY): Payer: Self-pay | Admitting: Otolaryngology

## 2020-12-05 ENCOUNTER — Other Ambulatory Visit: Payer: Self-pay | Admitting: Otolaryngology

## 2020-12-05 DIAGNOSIS — J329 Chronic sinusitis, unspecified: Secondary | ICD-10-CM

## 2020-12-12 ENCOUNTER — Encounter: Payer: Self-pay | Admitting: *Deleted

## 2020-12-13 ENCOUNTER — Other Ambulatory Visit: Payer: Self-pay | Admitting: Neurology

## 2020-12-13 ENCOUNTER — Telehealth: Payer: Self-pay | Admitting: Neurology

## 2020-12-13 DIAGNOSIS — F429 Obsessive-compulsive disorder, unspecified: Secondary | ICD-10-CM

## 2020-12-13 DIAGNOSIS — G4762 Sleep related leg cramps: Secondary | ICD-10-CM

## 2020-12-13 DIAGNOSIS — G4733 Obstructive sleep apnea (adult) (pediatric): Secondary | ICD-10-CM

## 2020-12-13 DIAGNOSIS — D509 Iron deficiency anemia, unspecified: Secondary | ICD-10-CM

## 2020-12-13 DIAGNOSIS — G2581 Restless legs syndrome: Secondary | ICD-10-CM

## 2020-12-13 NOTE — Telephone Encounter (Signed)
Called the patient

## 2020-12-13 NOTE — Telephone Encounter (Signed)
Patient left message in regards to scheduling her CPAP titration sleep study. Requested a call back at [phone].

## 2020-12-13 NOTE — Telephone Encounter (Signed)
Order has been placed and will send to the DME company for the pt. Pt sent a mychart message and I will reply to that.

## 2021-01-23 ENCOUNTER — Encounter: Payer: Self-pay | Admitting: Neurology

## 2021-01-25 ENCOUNTER — Encounter: Payer: Self-pay | Admitting: Neurology

## 2021-01-31 ENCOUNTER — Telehealth: Payer: Self-pay | Admitting: Family Medicine

## 2021-01-31 NOTE — Chronic Care Management (AMB) (Signed)
  Chronic Care Management   Note  01/31/2021 Name: MINSA WIEMER MRN: [id number] DOB: 1955-11-02  AMANE MANGONE is a 65 y.o. year old female who is a primary care patient of Birdie Riddle, Aundra Millet, MD. I reached out to [name] by phone today in response to a referral sent by Ms. Serafina Mitchell Chaloux's PCP, Midge Minium, MD.   Ms. Elicker was given information about Chronic Care Management services today including:  CCM service includes personalized support from designated clinical staff supervised by her physician, including individualized plan of care and coordination with other care providers 24/7 contact phone numbers for assistance for urgent and routine care needs. Service will only be billed when office clinical staff spend 20 minutes or more in a month to coordinate care. Only one practitioner may furnish and bill the service in a calendar month. The patient may stop CCM services at any time (effective at the end of the month) by phone call to the office staff.   Patient agreed to services and verbal consent obtained.   Follow up plan:   Tatjana Secretary/administrator

## 2021-03-20 ENCOUNTER — Ambulatory Visit (INDEPENDENT_AMBULATORY_CARE_PROVIDER_SITE_OTHER): Payer: Medicare Other | Admitting: Neurology

## 2021-03-20 ENCOUNTER — Other Ambulatory Visit: Payer: Self-pay

## 2021-03-20 ENCOUNTER — Telehealth: Payer: Self-pay | Admitting: Neurology

## 2021-03-20 ENCOUNTER — Encounter: Payer: Self-pay | Admitting: Neurology

## 2021-03-20 VITALS — BP 134/74 | HR 69 | Ht 62.0 in | Wt 144.0 lb

## 2021-03-20 DIAGNOSIS — G2581 Restless legs syndrome: Secondary | ICD-10-CM

## 2021-03-20 DIAGNOSIS — G4733 Obstructive sleep apnea (adult) (pediatric): Secondary | ICD-10-CM

## 2021-03-20 DIAGNOSIS — Z789 Other specified health status: Secondary | ICD-10-CM | POA: Diagnosis not present

## 2021-03-20 DIAGNOSIS — F422 Mixed obsessional thoughts and acts: Secondary | ICD-10-CM

## 2021-03-20 DIAGNOSIS — D501 Sideropenic dysphagia: Secondary | ICD-10-CM

## 2021-03-20 DIAGNOSIS — D649 Anemia, unspecified: Secondary | ICD-10-CM | POA: Insufficient documentation

## 2021-03-20 DIAGNOSIS — D508 Other iron deficiency anemias: Secondary | ICD-10-CM

## 2021-03-20 MED ORDER — MAGNESIUM 30 MG PO TABS: 30.0000 mg | ORAL_TABLET | Freq: Every day | ORAL | 5 refills | Status: DC

## 2021-03-20 MED ORDER — PRAMIPEXOLE DIHYDROCHLORIDE 1 MG PO TABS: 1.5000 mg | ORAL_TABLET | Freq: Every day | ORAL | 3 refills | Status: DC

## 2021-03-20 MED ORDER — TRAZODONE HCL 50 MG PO TABS: 100.0000 mg | ORAL_TABLET | Freq: Every day | ORAL | 1 refills | Status: DC

## 2021-03-20 NOTE — Patient Instructions (Signed)
Restless Legs Syndrome Restless legs syndrome is a condition that causes uncomfortable feelings or sensations in the legs, especially while sitting or lying down. The sensations usually cause an overwhelming urge to move the legs. The arms can also sometimes be affected. The condition can range from mild to severe. The symptoms often interfere with a person's ability to sleep. What are the causes? The cause of this condition is not known. What increases the risk? The following factors may make you more likely to develop this condition: Being older than 50. Pregnancy. Being a woman. In general, the condition is more common in women than in men. A family history of the condition. Having iron deficiency. Overuse of caffeine, nicotine, or alcohol. Certain medical conditions, such as kidney disease, Parkinson's disease, or nerve damage. Certain medicines, such as those for high blood pressure, nausea, colds, allergies, depression, and some heart conditions. What are the signs or symptoms? The main symptom of this condition is uncomfortable sensations in the legs, such as: Pulling. Tingling. Prickling. Throbbing. Crawling. Burning. Usually, the sensations: Affect both sides of the body. Are worse when you sit or lie down. Are worse at night. These may wake you up or make it difficult to fall asleep. Make you have a strong urge to move your legs. Are temporarily relieved by moving your legs. The arms can also be affected, but this is rare. People who have this condition often have tiredness during the day because of their lack of sleep at night. How is this diagnosed? This condition may be diagnosed based on: Your symptoms. Blood tests. In some cases, you may be monitored in a sleep lab by a specialist (a sleep study). This can detect any disruptions in your sleep. How is this treated? This condition is treated by managing the symptoms. This may include: Lifestyle changes, such as  exercising, using relaxation techniques, and avoiding caffeine, alcohol, or tobacco. Medicines. Anti-seizure medicines may be tried first. Follow these instructions at home: General instructions Take over-the-counter and prescription medicines only as told by your health care provider. Use methods to help relieve the uncomfortable sensations, such as: Massaging your legs. Walking or stretching. Taking a cold or hot bath. Keep all follow-up visits as told by your health care provider. This is important. Lifestyle   Practice good sleep habits. For example, go to bed and get up at the same time every day. Most adults should get 7-9 hours of sleep each night. Exercise regularly. Try to get at least 30 minutes of exercise most days of the week. Practice ways of relaxing, such as yoga or meditation. Avoid caffeine and alcohol. Do not use any products that contain nicotine or tobacco, such as cigarettes and e-cigarettes. If you need help quitting, ask your health care provider. Contact a health care provider if: Your symptoms get worse or they do not improve with treatment. Summary Restless legs syndrome is a condition that causes uncomfortable feelings or sensations in the legs, especially while sitting or lying down. The symptoms often interfere with a person's ability to sleep. This condition is treated by managing the symptoms. You may need to make lifestyle changes or take medicines. This information is not intended to replace advice given to you by your health care provider. Make sure you discuss any questions you have with your health care provider. Document Revised: 07/22/2019 Document Reviewed: 06/22/2017 Elsevier Patient Education  2022 [place]. Muscle Cramps and Spasms Muscle cramps and spasms occur when a muscle or muscles tighten and  you have no control over this tightening (involuntary muscle contraction). They are a common problem and can develop in any muscle. The most  common place is in the calf muscles of the leg. Muscle cramps and muscle spasms are both involuntary muscle contractions, but there are some differences between the two: Muscle cramps are painful. They come and go and may last for a few seconds or up to 15 minutes. Muscle cramps are often more forceful and last longer than muscle spasms. Muscle spasms may or may not be painful. They may also last just a few seconds or much longer. Certain medical conditions, such as diabetes or Parkinson's disease, can make it more likely to develop cramps or spasms. However, cramps or spasms are usually not caused by a serious underlying problem. Common causes include: Doing more physical work or exercise than your body is ready for (overexertion). Overuse from repeating certain movements too many times. Remaining in a certain position for a long period of time. Improper preparation, form, or technique while playing a sport or doing an activity. Dehydration. Injury. Side effects of some medicines. Abnormally low levels of the salts and minerals in your blood (electrolytes), especially potassium and calcium. This could happen if you are taking water pills (diuretics) or if you are pregnant. In many cases, the cause of muscle cramps or spasms is not known. Follow these instructions at home: Managing pain and stiffness   Try massaging, stretching, and relaxing the affected muscle. Do this for several minutes at a time. If directed, apply heat to tight or tense muscles as often as told by your health care provider. Use the heat source that your health care provider recommends, such as a moist heat pack or a heating pad. Place a towel between your skin and the heat source. Leave the heat on for 20-30 minutes. Remove the heat if your skin turns bright red. This is especially important if you are unable to feel pain, heat, or cold. You may have a greater risk of getting burned. If directed, put ice on the affected  area. This may help if you are sore or have pain after a cramp or spasm. Put ice in a plastic bag. Place a towel between your skin and the bag. Leave the ice on for 20 minutes, 2-3 times a day. Try taking hot showers or baths to help relax tight muscles. Eating and drinking Drink enough fluid to keep your urine pale yellow. Staying well hydrated may help prevent cramps or spasms. Eat a healthy diet that includes plenty of nutrients to help your muscles function. A healthy diet includes fruits and vegetables, lean protein, whole grains, and low-fat or nonfat dairy products. General instructions If you are having frequent cramps, avoid intense exercise for several days. Take over-the-counter and prescription medicines only as told by your health care provider. Pay attention to any changes in your symptoms. Keep all follow-up visits as told by your health care provider. This is important. Contact a health care provider if: Your cramps or spasms get more severe or happen more often. Your cramps or spasms do not improve over time. Summary Muscle cramps and spasms occur when a muscle or muscles tighten and you have no control over this tightening (involuntary muscle contraction). The most common place for cramps or spasms to occur is in the calf muscles of the leg. Massaging, stretching, and relaxing the affected muscle may relieve the cramp or spasm. Drink enough fluid to keep your urine pale yellow. Staying  well hydrated may help prevent cramps or spasms. This information is not intended to replace advice given to you by your health care provider. Make sure you discuss any questions you have with your health care provider. Document Revised: 10/26/2017 Document Reviewed: 10/26/2017 Elsevier Patient Education  [place].

## 2021-03-20 NOTE — Telephone Encounter (Signed)
Referral sent to [facility]. Phone: [phone]. Copy of sleep study was attached with [facility] notes.

## 2021-03-20 NOTE — Progress Notes (Signed)
SLEEP MEDICINE CLINIC    Provider:  Larey Seat, MD  Primary Care Physician:  Midge Minium, MD [address] [place] [address]     Referring Provider: Midge Minium, [place] [address] [place],  [place] [address]          Chief Complaint according to patient   Patient presents with:     New Patient (Initial Visit)     chronic insomnia.       HISTORY OF PRESENT ILLNESS:  Debra Matthews is a 65  year old Caucasian female patient who was seen upon a referral from dr Birdie Riddle and now presents for follow up on 03/20/2021.  Debra Matthews is seen here today on 03-20-2021 she had quite a long weight following her sleep study before she could initiate therapy.  Her sleep study revealed an AHI of 26.3 REM accentuated to 38.6, supine AHI was 29.1/h versus nonsupine AHI of 10.4/h.  She spent a lot of about 14 minutes spent desaturation total time during her sleep study the patient has a history of sleeping less than 5 hours at night partially due to restless legs and partially due to sleep fragmentation she also describes leg cramping which has not resolved.  Sleep efficiency in the sleep lab was 68%.  She was started on a CPAP which she only received in August of this year 8 months after her sleep study.  This is a air sense 11 AutoSet with a minimum pressure of 6 maximum pressure of 18 and she has used it 100% of the last 30 days and 29 days of those over 4 hours consecutively.  The pressure 95th percentile is 13 cmH2O the air leaks remain high also she has tried more than 3 different masks and a chinstrap.  Her current 95th percentile air leak is 40.3 L a minute and the air leaks bother her and wake her up.  Her residual AHI on the other hand looks like a success it is 2.2/h.    She also feels that the whistling and hissing sounds from the machine may wake up her husband and she still struggles at night with restless legs despite being on Mirapex up to 3 mg a day and Desyrel  50 mg to help with sleep as needed.  Her current Epworth sleepiness score was again endorsed at 15 out of 24 points.    This is actually a point higher than prior to her sleep study.  So we discussed to get alternatives to CPAP while I am able with medication hopefully to further reduce restless leg and leg cramping I do not think that she has been able to sleep well because of the CPAP itself.  She does recognize that there is some claustrophobia.  She also needs to stay off her back to have the lowest possible apnea and that dislodges the CPAP mask from her face.  So my alternatives in this non-REM dependent sleep apnea there is mild hypoxemia is to try a dental device or if that would fail we could discuss an inspire device.  I think that the dental device will be easier for her to handle and hopefully we did achieve the reduction of the AHI under 10 an hour.  I would be happy with that result given that she does not have cardiovascular disease.  She has made a very good effort to use CPAP !      05-21-2020 CONSULTATION;  Chief concern according to patient :  I have RLS and can't sleep well for well over a decade. I also had the right  parial knee replaced.  She could not tolerate Ropinorol ( 'I got tense"  and gabapentin made her drowsy, but Mirapex was well tolerated and now struggles with anticipation, early and earlier in the day. Has a lot of muscle cramps.      Debra Matthews is a right -handed  Caucasian female with Insomnia and RLS, with severe muscle cramping  She has a past medical history of Abdominal pain, epigastric, Anemia, Arthritis, Cancer (HCC), Chronic constipation, Cyst in hand, Gastritis due to nonsteroidal anti-inflammatory drug (NSAID), GERD (gastroesophageal reflux disease), Headache, History of chicken pox, History of shingles, RLS (restless legs syndrome), and Subchondral insufficiency fracture of femoral condyle, right, initial encounter ([place]) (07/15/2016). She has chronic  insomnia and OCD tendendencies. She described herself as a "clean freak".  The patient never had a sleep study. She had iron infusions in 2015 - anemia panel, followed by hematology.  Was attributed to ulcerative gastritis.    Sleep relevant medical history: Nocturia following arousal, Sleep walking on Ambien,no TBI, no ENT procedures.   Family medical /sleep history: No other family member with OSA, insomnia, RLS.     Social history: her husband works third shift- she stays up - and she cleaned the house, the barn.   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 3 adult children, 5 grandchildren. Helps home-schooling  In [facility].  Pets are present. 6 miniature horses, 2 cats,  lost her dog. Tobacco use: never .  ETOH use - seldomly ,  Caffeine intake in form of Coffee( /) Soda( /) Tea ( at restaurants) or energy drinks. 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 Swimming in summer.      Sleep habits are as follows: The patient's dinner time is between 6 PM. The patient goes to bed at 10.30 PM and struggles with RLS and cramps, once asleep , she continues to sleep for intervals of 2 hours, wakes for unknown reasons- can be RLS related, can be spontaneous- , the first time at 2-3 AM.   The preferred sleep position is variable , with the support of 1 pillow only in spite of GERD. Adjustable bed - 15 degrees for husbands COPD.  Dreams are reportedly rare. 2-4 AM is the usual rise time.  She feels wide awake and goes to watch TV.  The patient wakes up spontaneously. Average sleep in 24 hours- 4 hours.   She reports not feeling refreshed or restored in AM, with symptoms such as leg pain,  Sinus headaches, and residual fatigue. Naps are taken infrequently, dozing off in front of the TV or while reading ,lasting from 15-30 minutes is refreshing as nocturnal sleep.    Review of Systems: Out of a complete 14 system review, the patient  complains of only the following symptoms, and all other reviewed systems are negative.:  Fatigue, sleepiness , snoring, fragmented sleep,  Insomnia , RLS and sleep hygiene, GERD.    How likely are you to doze in the following situations: 0 = not likely, 1 = slight chance, 2 = moderate chance, 3 = high chance   Sitting and Reading? Watching Television? Sitting inactive in a public place (theater or meeting)? As a passenger in a car for an hour without a break? Lying down in the afternoon when  circumstances permit? Sitting and talking to someone? Sitting quietly after lunch without alcohol? In a car, while stopped for a few minutes in traffic?   Total = 15/ 24 points   FSS endorsed at 46/ 63 points.    RLS   Leg cramping.   Social History   Socioeconomic History   Marital status: Married    Spouse name: Not on file   Number of children: 3   Years of education: Not on file   Highest education level: Not on file  Occupational History   Occupation: retired  Tobacco Use   Smoking status: Never   Smokeless tobacco: Never  Vaping Use   Vaping Use: Never used  Substance and Sexual Activity   Alcohol use: No    Alcohol/week: 0.0 standard drinks   Drug use: No   Sexual activity: Yes  Other Topics Concern   Not on file  Social History Narrative   Not on file   Social Determinants of Health   Financial Resource Strain: Low Risk    Difficulty of Paying Living Expenses: Not hard at all  Food Insecurity: No Food Insecurity   Worried About Charity fundraiser in the Last Year: Never true   [place] in the Last Year: Never true  Transportation Needs: No Transportation Needs   Lack of Transportation (Medical): No   Lack of Transportation (Non-Medical): No  Physical Activity: Inactive   Days of Exercise per Week: 0 days   Minutes of Exercise per Session: 0 min  Stress: No Stress Concern Present   Feeling of Stress : Not at all  Social Connections: Moderately  Isolated   Frequency of Communication with Friends and Family: More than three times a week   Frequency of Social Gatherings with Friends and Family: More than three times a week   Attends Religious Services: Never   Marine scientist or Organizations: No   Attends Music therapist: Never   Marital Status: Married    Family History  Problem Relation Age of Onset   Scoliosis Mother    Mitral valve prolapse Mother    Colon cancer Maternal Aunt     Past Medical History:  Diagnosis Date   Abdominal pain, epigastric    Anemia    hx of iron def.   Arthritis    Cancer ([place])    non melanoma skin cancer  chest   Chronic constipation    in past   Cyst in hand    right wrist   Gastritis due to nonsteroidal anti-inflammatory drug (NSAID)    GERD (gastroesophageal reflux disease)    Headache    hx of    History of chicken pox    History of shingles    RLS (restless legs syndrome)    Subchondral insufficiency fracture of femoral condyle, right, initial encounter ([place]) 07/15/2016    Past Surgical History:  Procedure Laterality Date   KNEE ARTHROSCOPY WITH MENISCAL REPAIR Right 05/18/2017   Procedure: KNEE ARTHROSCOPY WITH PARTIAL LATERAL MENISCECTOMY;  Surgeon: Paralee Cancel, MD;  Location: [facility];  Service: Orthopedics;  Laterality: Right;  Adductor Block   MEDIAL PARTIAL KNEE REPLACEMENT Right 05/18/2017   Procedure: RIGHT MEDIAL PARTIAL KNEE REPLACEMENT;  Surgeon: Paralee Cancel, MD;  Location: [facility];  Service: Orthopedics;  Laterality: Right;  Adductor Block   TUBAL LIGATION       Current Outpatient Medications on File Prior to Visit  Medication Sig Dispense Refill   cetirizine (ZYRTEC)  10 MG tablet Take 1 tablet (10 mg total) by mouth daily. (Patient taking differently: Take 10 mg by mouth as needed for allergies.) 30 tablet 11   clobetasol ointment (TEMOVATE) 5.69 % Apply 1 application topically as needed for rash.  2   fluticasone (FLONASE) 50 MCG/ACT nasal  spray SPRAY 2 SPRAYS INTO EACH NOSTRIL EVERY DAY 48 mL 2   pramipexole (MIRAPEX) 1 MG tablet TAKE 1 TABLET (1 MG TOTAL) BY MOUTH 3 (THREE) TIMES DAILY. (Patient taking differently: Take 1.5-3 mg by mouth daily. 1.5 tablet daily with additional 1.5 tab as needed for break-through) 270 tablet 1   traZODone (DESYREL) 50 MG tablet Take 1 tablet (50 mg total) by mouth at bedtime as needed for sleep. 90 tablet 1   Wheat Dextrin (BENEFIBER PO) Take 1 scoop by mouth 2 (two) times daily as needed (constipation).      No current facility-administered medications on file prior to visit.    Allergies  Allergen Reactions   Pseudoephedrine Other (See Comments)    Insomnia   Prednisone     Pt feels bad, headache, no appetite.    Physical exam:  Today's Vitals   03/20/21 0819  BP: 134/74  Pulse: 69  Weight: 144 lb (65.3 kg)  Height: 5\' 2"  (1.575 m)   Body mass index is 26.34 kg/m.   Wt Readings from Last 3 Encounters:  03/20/21 144 lb (65.3 kg)  10/22/20 141 lb (64 kg)  10/09/20 144 lb (65.3 kg)     Ht Readings from Last 3 Encounters:  03/20/21 5\' 2"  (1.575 m)  10/01/20 5\' 2"  (1.575 m)  05/29/20 5\' 2"  (1.575 m)      General: The patient is awake, alert and appears not in acute distress.   She is logorrhoeic.  The patient is well groomed. Head: Normocephalic, atraumatic. Neck is supple. Mallampati  1 ,  neck circumference: 14 inches . Nasal airflow patent.  Retrognathia is not  seen.  Dental status: intact  Cardiovascular:  Regular rate and cardiac rhythm by pulse,  without distended neck veins. Respiratory: Lungs are clear to auscultation.  Skin:  Without evidence of ankle edema, or rash. Trunk: The patient's posture is erect.   Neurologic exam : The patient is awake and alert, oriented to place and time.   Memory subjective described as intact.  Attention span & concentration ability appears normal.  Speech is fluent,  without  dysarthria, dysphonia or aphasia.  Mood and  affect are appropriate.   Cranial nerves: no loss of smell or taste reported, fully vaccinated for COVID.   Pupils are equal and briskly reactive to light. Funduscopic exam deferred. .  Extraocular movements in vertical and horizontal planes were intact and without nystagmus. No Diplopia. Visual fields by finger perimetry are intact. Hearing was intact to soft voice and finger rubbing. Facial sensation intact to fine touch. Facial motor strength is symmetric and tongue and uvula move midline. Neck ROM : rotation, tilt and flexion extension were normal for age and shoulder shrug was symmetrical.    Motor exam:  Symmetric bulk, tone and ROM.   Normal tone without cog- wheeling, symmetric grip strength .  Sensory:  Fine touch, pinprick and vibration were tested  and  normal.  Proprioception tested in the upper extremities was normal. Coordination: Rapid alternating movements in the fingers/hands were of normal speed.  The Finger-to-nose maneuver was intact without evidence of ataxia, dysmetria or tremor.  Gait and station: Patient could rise unassisted from a  seated position, walked without assistive device.  Stance is of normal width/ base and the patient turned with 3 steps.  Toe and heel walk were deferred.  Deep tendon reflexes: in the upper and lower extremities are brisk, symmetric and intact.  Babinski response was deferred.        After spending a total time of 45 minutes face to face and additional time for physical and neurologic examination, review of laboratory studies,  personal review of imaging studies, reports and results of other testing and review of referral information / records as far as provided in visit, I have established the following assessments:  1) chronic insomnia related to OCD, high level of internal stress. 2)  RLS and leg cramping.  Chronically sleep deprived due to both  3) moderate OSA with AHI of 20 plus,  supine dependent.    My Plan is to proceed  with:  1) referral to sleep dentistry for mandibular advancement therapy.  2) Trazodone has been used in the past and helped for awhile, can use up to two tabs of 50 mg at night.   3) Anemia, ferritin was 6 ng/ mL in June 2021. 17 in  July, with hemoglb at 10.7 , her HCT at 13 - both low.   She should have 50 as a RLS patient. Severe iron deficiency. Had 2 infusions and got relief. Medicare did not cover or not cover for the ICD code or that product. I like to check ferritin, and TIBC again.     Consider SSRI in the future to get OCD relief.   I would like to thank  Midge Minium, Md [address] [place],  [place] [address] for allowing me to meet with and to take care of this pleasant patient.   In short, ETOY MCDONNELL is presenting with RLS, cramping, and chronic insomnia, all symptom that can be attributed to RLS and psychological insomnia as well.   I plan to follow up either personally or through our NP within 6 month, loking at dental device treatment success, HST may be needed to confirm apnea relief. We will consider another iron infusion. OCD treatment per PCP. Marland Kitchen   CC: I will share my notes with PCP.   Electronically signed by: Larey Seat, MD 03/20/2021 8:45 AM  [facility] Board certified by The [employer] of Sleep Medicine and Diplomate of the [employer] of Sleep Medicine. Board certified In Neurology through the [place], Fellow of the [employer] of Neurology. Medical Director of [facility].

## 2021-03-21 ENCOUNTER — Encounter: Payer: Self-pay | Admitting: Neurology

## 2021-03-21 LAB — IRON,TIBC AND FERRITIN PANEL
Ferritin: 15 ng/mL (ref 15–150)
Iron Saturation: 13 % — ABNORMAL LOW (ref 15–55)
Iron: 54 ug/dL (ref 27–139)
Total Iron Binding Capacity: 432 ug/dL (ref 250–450)
UIBC: 378 ug/dL — ABNORMAL HIGH (ref 118–369)

## 2021-03-21 LAB — CK TOTAL AND CKMB (NOT AT ARMC)
CK-MB Index: 1.6 ng/mL (ref 0.0–5.3)
Total CK: 80 U/L (ref 32–182)

## 2021-03-21 NOTE — Telephone Encounter (Signed)
Completed order for IV iron infusion: iron sucrose 100mg  IV in 16ml NS over 30 min once. Asked that infusion suite let us know once pt scheduled so we can then set up repeat labs 2 weeks after she receives infusion.

## 2021-03-21 NOTE — Progress Notes (Signed)
Iron saturation and ferritin are low again, ferritin should be around 50 for RLS patients with correlation of iron deficiency.  If the patient cannot maintain iron sats by oral iron, we would need to use IV iron therapy.

## 2021-03-26 ENCOUNTER — Encounter: Payer: Self-pay | Admitting: Neurology

## 2021-03-27 ENCOUNTER — Ambulatory Visit: Payer: Medicare Other

## 2021-03-27 NOTE — Telephone Encounter (Signed)
Dr. Ron Parker PPW placed in Dr. Edwena Felty box for signature

## 2021-03-27 NOTE — Telephone Encounter (Signed)
Dr. Kae Heller referral form completed and pending MD signature. Will fax to Dr. Kae Heller office. Phone: [phone].

## 2021-03-27 NOTE — Telephone Encounter (Signed)
Patient had contacted [place] through [facility] messaging in regards to the referral that was sent to Dr. Corky Sing office and had no insurance would not cover her to be seen there.  Patient would like to be referred to Dr. Ainsley Spinner office.  Dr. Ron Parker representative reached out to our office advising they have her scheduled at the end of the month but would just need the referral and information sent over to their location.  Informed her that I would send everything to our referrals team who will get the orders and paperwork sent over.  She was appreciative.

## 2021-04-07 NOTE — Progress Notes (Signed)
Telemedicine Encounter- SOAP NOTE Established Patient  This telephone encounter was conducted with the patient's (or proxy's) verbal consent via audio telecommunications: yes/no: Yes Patient was instructed to have this encounter in a suitably private space; and to only have persons present to whom they give permission to participate. In addition, patient identity was confirmed by use of name plus two identifiers (DOB and address).  I discussed the limitations, risks, security and privacy concerns of performing an evaluation and management service by telephone and the availability of in person appointments. I also discussed with the patient that there may be a patient responsible charge related to this service. The patient expressed understanding and agreed to proceed.  I spent a total of 17 minutes talking with the patient or their proxy.  Patient at home Provider in office  Participants: Kathrin Ruddy, NP and [name]  Chief Complaint  Patient presents with   Cough    Patient states for 12 days she has been experiencing a bad cough, wheezing, congestion,and stopped up ear. She states she thought it was sinus issues but has got worse over time. She took a lot of otc medications with no relief.    Subjective   Debra Matthews is a 65 y.o. established patient. Telephone visit today for cough  HPI Onset with URI symptoms 12 days ago Migrated to chest Productive cough, wheezing, congestion, and ongoing nasal congestion and ear pressure. Has taken a wide variety of OTC medications without relief. No sick contacts.  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 03/20/2021   Absolute anemia 03/20/2021   Chronic insomnia 05/29/2020   Obsessive-compulsive  disorder 05/29/2020   Iron deficiency anemia 05/29/2020   Sleep related leg cramps 05/29/2020   PND (post-nasal drip) 05/19/2019   S/P right unicompartmental knee replacement 05/18/2017   Hyperlipidemia 05/06/2017   Physical exam 08/13/2016   Osteoporosis 08/13/2016   Carpal tunnel syndrome, bilateral 08/13/2016   Insomnia 08/13/2016   Subchondral insufficiency fracture of femoral condyle, right, initial encounter ([place]) 07/15/2016   Anxiety state 03/17/2016   RLS (restless legs syndrome) 02/11/2016   GERD (gastroesophageal reflux disease) 02/11/2016   Migraines 02/11/2016    Past Medical History:  Diagnosis Date   Abdominal pain, epigastric    Anemia    hx of iron def.   Arthritis    Cancer ([place])    non melanoma skin cancer  chest   Chronic constipation    in past   Cyst in hand    right wrist   Gastritis due to nonsteroidal anti-inflammatory drug (NSAID)    GERD (gastroesophageal reflux disease)    Headache    hx of    History of chicken pox    History of shingles    RLS (restless legs syndrome)    Subchondral insufficiency fracture of femoral condyle, right, initial encounter ([place]) 07/15/2016    Current Outpatient Medications  Medication Sig Dispense Refill   clobetasol ointment (TEMOVATE) 9.76 % Apply 1 application topically as needed for rash.  2   fluticasone (FLONASE) 50 MCG/ACT nasal spray SPRAY 2 SPRAYS INTO EACH NOSTRIL EVERY DAY 48 mL 2   Wheat Dextrin (BENEFIBER PO) Take 1 scoop by mouth 2 (two) times daily as needed (constipation).      cetirizine (  ZYRTEC) 10 MG tablet Take 1 tablet (10 mg total) by mouth daily. (Patient taking differently: Take 10 mg by mouth as needed for allergies.) 30 tablet 11   magnesium 30 MG tablet Take 1 tablet (30 mg total) by mouth daily at 2 PM. 30 tablet 5   pramipexole (MIRAPEX) 1 MG tablet Take 1.5-3 tablets (1.5-3 mg total) by mouth daily. 1.5 tablet daily with additional 1.5 tab as needed for break-through 270 tablet 3    traZODone (DESYREL) 50 MG tablet Take 2 tablets (100 mg total) by mouth at bedtime. 90 tablet 1   No current facility-administered medications for this visit.    Allergies  Allergen Reactions   Pseudoephedrine Other (See Comments)    Insomnia   Prednisone     Pt feels bad, headache, no appetite.    Social History   Socioeconomic History   Marital status: Married    Spouse name: Not on file   Number of children: 3   Years of education: Not on file   Highest education level: Not on file  Occupational History   Occupation: retired  Tobacco Use   Smoking status: Never   Smokeless tobacco: Never  Vaping Use   Vaping Use: Never used  Substance and Sexual Activity   Alcohol use: No    Alcohol/week: 0.0 standard drinks   Drug use: No   Sexual activity: Yes  Other Topics Concern   Not on file  Social History Narrative   Not on file   Social Determinants of Health   Financial Resource Strain: Low Risk    Difficulty of Paying Living Expenses: Not hard at all  Food Insecurity: No Food Insecurity   Worried About Charity fundraiser in the Last Year: Never true   [place] in the Last Year: Never true  Transportation Needs: No Transportation Needs   Lack of Transportation (Medical): No   Lack of Transportation (Non-Medical): No  Physical Activity: Inactive   Days of Exercise per Week: 0 days   Minutes of Exercise per Session: 0 min  Stress: No Stress Concern Present   Feeling of Stress : Not at all  Social Connections: Moderately Isolated   Frequency of Communication with Friends and Family: More than three times a week   Frequency of Social Gatherings with Friends and Family: More than three times a week   Attends Religious Services: Never   Marine scientist or Organizations: No   Attends Music therapist: Never   Marital Status: Married  Human resources officer Violence: Not At Risk   Fear of Current or Ex-Partner: No   Emotionally Abused: No    Physically Abused: No   Sexually Abused: No    Review of Systems  Constitutional: Negative.   HENT:  Positive for congestion, ear pain and sinus pain.   Eyes: Negative.   Respiratory:  Positive for cough, sputum production and wheezing. Negative for hemoptysis and shortness of breath.   Cardiovascular: Negative.   Gastrointestinal: Negative.   Genitourinary: Negative.   Musculoskeletal: Negative.   Skin: Negative.   Neurological: Negative.   Endo/Heme/Allergies: Negative.   Psychiatric/Behavioral: Negative.    All other systems reviewed and are negative.  Objective   Vitals as reported by the patient: Today's Vitals   10/09/20 0951  Temp: 97.8 F (36.6 C)  Weight: 144 lb (65.3 kg)    [place] was seen today for cough.  Diagnoses and all orders for this visit:  Cough -  Discontinue: amoxicillin-clavulanate (AUGMENTIN) 875-125 MG tablet; Take 1 tablet by mouth 2 (two) times daily. (Patient not taking: Reported on 10/22/2020) -     Discontinue: azelastine (ASTELIN) 0.1 % nasal spray; Place 1 spray into both nostrils 2 (two) times daily. Use in each nostril as directed (Patient not taking: Reported on 10/22/2020) -     Discontinue: benzonatate (TESSALON) 100 MG capsule; Take 1 capsule (100 mg total) by mouth 2 (two) times daily as needed for cough. (Patient not taking: Reported on 10/22/2020)  Acute bacterial sinusitis -     Discontinue: amoxicillin-clavulanate (AUGMENTIN) 875-125 MG tablet; Take 1 tablet by mouth 2 (two) times daily. (Patient not taking: Reported on 10/22/2020) -     Discontinue: azelastine (ASTELIN) 0.1 % nasal spray; Place 1 spray into both nostrils 2 (two) times daily. Use in each nostril as directed (Patient not taking: Reported on 10/22/2020) -     Discontinue: benzonatate (TESSALON) 100 MG capsule; Take 1 capsule (100 mg total) by mouth 2 (two) times daily as needed for cough. (Patient not taking: Reported on 10/22/2020)   PLAN Given duration of symptoms will start  abx with augmentin as above Azelastine and tessalon for symptom relief Reviewed ER precautions with pt who voices understanding Will need in person assessment if symptoms persist or worsen Patient encouraged to call clinic with any questions, comments, or concerns.   I discussed the assessment and treatment plan with the patient. The patient was provided an opportunity to ask questions and all were answered. The patient agreed with the plan and demonstrated an understanding of the instructions.   The patient was advised to call back or seek an in-person evaluation if the symptoms worsen or if the condition fails to improve as anticipated.  I provided 17 minutes of non-face-to-face time during this encounter.  Maximiano Coss, NP

## 2021-04-08 ENCOUNTER — Encounter: Payer: Self-pay | Admitting: Neurology

## 2021-04-30 ENCOUNTER — Other Ambulatory Visit: Payer: Self-pay | Admitting: Neurology

## 2021-04-30 ENCOUNTER — Other Ambulatory Visit (INDEPENDENT_AMBULATORY_CARE_PROVIDER_SITE_OTHER): Payer: Self-pay

## 2021-04-30 DIAGNOSIS — D501 Sideropenic dysphagia: Secondary | ICD-10-CM

## 2021-04-30 DIAGNOSIS — G2581 Restless legs syndrome: Secondary | ICD-10-CM

## 2021-04-30 DIAGNOSIS — Z0289 Encounter for other administrative examinations: Secondary | ICD-10-CM

## 2021-05-01 ENCOUNTER — Encounter: Payer: Self-pay | Admitting: Neurology

## 2021-05-01 DIAGNOSIS — M25551 Pain in right hip: Secondary | ICD-10-CM | POA: Insufficient documentation

## 2021-05-01 LAB — IRON AND TIBC
Iron Saturation: 27 % (ref 15–55)
Iron: 104 ug/dL (ref 27–139)
Total Iron Binding Capacity: 379 ug/dL (ref 250–450)
UIBC: 275 ug/dL (ref 118–369)

## 2021-05-01 LAB — FERRITIN: Ferritin: 57 ng/mL (ref 15–150)

## 2021-05-01 NOTE — Progress Notes (Signed)
Ferritin above 50 ! Iron saturation is now in normal range- correction within 1 month. Does this clinically correlate to improving the  RLS symptoms?

## 2021-05-02 DIAGNOSIS — M7061 Trochanteric bursitis, right hip: Secondary | ICD-10-CM | POA: Insufficient documentation

## 2021-06-12 ENCOUNTER — Other Ambulatory Visit: Payer: Self-pay | Admitting: Neurology

## 2021-09-25 ENCOUNTER — Other Ambulatory Visit: Payer: Self-pay | Admitting: Neurology

## 2021-09-25 ENCOUNTER — Ambulatory Visit (INDEPENDENT_AMBULATORY_CARE_PROVIDER_SITE_OTHER): Payer: Medicare Other | Admitting: Family Medicine

## 2021-09-25 ENCOUNTER — Encounter: Payer: Self-pay | Admitting: Family Medicine

## 2021-09-25 VITALS — BP 163/75 | HR 83 | Ht 62.0 in | Wt 138.0 lb

## 2021-09-25 DIAGNOSIS — G2581 Restless legs syndrome: Secondary | ICD-10-CM

## 2021-09-25 DIAGNOSIS — G4733 Obstructive sleep apnea (adult) (pediatric): Secondary | ICD-10-CM

## 2021-09-25 DIAGNOSIS — D509 Iron deficiency anemia, unspecified: Secondary | ICD-10-CM | POA: Diagnosis not present

## 2021-09-25 DIAGNOSIS — G4762 Sleep related leg cramps: Secondary | ICD-10-CM

## 2021-09-25 DIAGNOSIS — Z789 Other specified health status: Secondary | ICD-10-CM | POA: Diagnosis not present

## 2021-09-25 DIAGNOSIS — F5104 Psychophysiologic insomnia: Secondary | ICD-10-CM

## 2021-09-25 MED ORDER — TRAZODONE HCL 50 MG PO TABS: 100.0000 mg | ORAL_TABLET | Freq: Every day | ORAL | 1 refills | Status: DC

## 2021-09-25 NOTE — Progress Notes (Signed)
? ? ?PATIENT: Debra Matthews ?DOB: 01-17-1956 ? ?REASON FOR VISIT: follow up ?HISTORY FROM: patient ? ?Chief Complaint  ?Patient presents with  ? Follow-up  ?  Rm 13 alone here for 6 month f/u- Pt reports she has been doing ok still struggling to adapt to the CPAP machine.   ?  ? ?HISTORY OF PRESENT ILLNESS: ? ?09/25/21 ALL:  ?Debra Matthews is a 66 y.o. female here today for follow up for OSA on CPAP.  She has had difficulty adjusting to CPAP. She saw Debra Debra Matthews and fitted for oral appliance. She does not feel it has been effective. She is concerned appliance will damage dental work. She has returned to CPAP. She has tried multiple masks and feels most comfortable with nasal mask. She has noted a leak in her mask. She admits that she has not changed her mask as often as she was directed. She does not note any specific improvement in sleep quality. She is interested in [place] but husband has hesitations. She continues to have difficulty with fatigue. She is extremely active during the day but feels tired all the time. ESS 15/24. She has had two iron transfusions that did help. She takes trazodone '50mg'$  4-5 times a week for insomnia. She continues pramipexole 1.5-'3mg'$  daily. She is also taking a MVI with magnesium for muscle cramps.  ? ? ? ?HISTORY: (copied from Debra Matthews's previous note) ? ?Debra Matthews is a 66  year old Caucasian female patient who was seen upon a referral from Debra Debra Matthews and now presents for follow up on 03/20/2021. ?  ?[name] is seen here today on 03-20-2021 she had quite a long weight following her sleep study before she could initiate therapy.  Her sleep study revealed an AHI of 26.3 REM accentuated to 38.6, supine AHI was 29.1/h versus nonsupine AHI of 10.4/h.  She spent a lot of about 14 minutes spent desaturation total time during her sleep study the patient has a history of sleeping less than 5 hours at night partially due to restless legs and partially due to sleep fragmentation she also  describes leg cramping which has not resolved.  Sleep efficiency in the sleep lab was 68%.  She was started on a CPAP which she only received in August of this year 8 months after her sleep study.  This is a air sense 11 AutoSet with a minimum pressure of 6 maximum pressure of 18 and she has used it 100% of the last 30 days and 29 days of those over 4 hours consecutively.  The pressure 95th percentile is 13 cmH2O the air leaks remain high also she has tried more than 3 different masks and a chinstrap.  Her current 95th percentile air leak is 40.3 L a minute and the air leaks bother her and wake her up.  Her residual AHI on the other hand looks like a success it is 2.2/h.  ?  ? She also feels that the whistling and hissing sounds from the machine may wake up her husband and she still struggles at night with restless legs despite being on Debra Matthews up to 3 mg a day and Debra Matthews 50 mg to help with sleep as needed.  Her current Epworth sleepiness score was again endorsed at 15 out of 24 points.   ?  ?This is actually a point higher than prior to her sleep study.  So we discussed to get alternatives to CPAP while I am able with medication hopefully to  further reduce restless leg and leg cramping I do not think that she has been able to sleep well because of the CPAP itself.  She does recognize that there is some claustrophobia.  She also needs to stay off her back to have the lowest possible apnea and that dislodges the CPAP mask from her face.  So my alternatives in this non-REM dependent sleep apnea there is mild hypoxemia is to try a dental device or if that would fail we could discuss an inspire device.  I think that the dental device will be easier for her to handle and hopefully we did achieve the reduction of the AHI under 10 an hour.  I would be happy with that result given that she does not have cardiovascular disease. ?  ?She has made a very good effort to use CPAP !  ? ? ?REVIEW OF SYSTEMS: Out of a complete 14  system review of symptoms, the patient complains only of the following symptoms, fatigue, muscle cramps, RLS, insomnia and all other reviewed systems are negative. ? ?ESS: 15/24, previously 15/24 ? ?ALLERGIES: ?Allergies  ?Allergen Reactions  ? Pseudoephedrine Other (See Comments)  ?  Insomnia  ? Prednisone   ?  Pt feels bad, headache, no appetite.  ? ? ?HOME MEDICATIONS: ?Outpatient Medications Prior to Visit  ?Medication Sig Dispense Refill  ? cetirizine (ZYRTEC) 10 MG tablet Take 1 tablet (10 mg total) by mouth daily. (Patient taking differently: Take 10 mg by mouth as needed for allergies.) 30 tablet 11  ? clobetasol ointment (TEMOVATE) 4.58 % Apply 1 application topically as needed for rash.  2  ? fluticasone (FLONASE) 50 MCG/ACT nasal spray SPRAY 2 SPRAYS INTO EACH NOSTRIL EVERY DAY 48 mL 2  ? pramipexole (Debra Matthews) 1 MG tablet Take 1.5-3 tablets (1.5-3 mg total) by mouth daily. 1.5 tablet daily with additional 1.5 tab as needed for break-through 270 tablet 3  ? Wheat Dextrin (BENEFIBER PO) Take 1 scoop by mouth 2 (two) times daily as needed (constipation).     ? magnesium 30 MG tablet Take 1 tablet (30 mg total) by mouth daily at 2 PM. 30 tablet 5  ? traZODone (Debra Matthews) 50 MG tablet Take 2 tablets (100 mg total) by mouth at bedtime. 90 tablet 1  ? ?No facility-administered medications prior to visit.  ? ? ?PAST MEDICAL HISTORY: ?Past Medical History:  ?Diagnosis Date  ? Abdominal pain, epigastric   ? Anemia   ? hx of iron def.  ? Arthritis   ? Cancer [facility])   ? non melanoma skin cancer  chest  ? Chronic constipation   ? in past  ? Cyst in hand   ? right wrist  ? Gastritis due to nonsteroidal anti-inflammatory drug (NSAID)   ? GERD (gastroesophageal reflux disease)   ? Headache   ? hx of   ? History of chicken pox   ? History of shingles   ? RLS (restless legs syndrome)   ? Subchondral insufficiency fracture of femoral condyle, right, initial encounter ([place]) 07/15/2016  ? ? ?PAST SURGICAL HISTORY: ?Past Surgical  History:  ?Procedure Laterality Date  ? KNEE ARTHROSCOPY WITH MENISCAL REPAIR Right 05/18/2017  ? Procedure: KNEE ARTHROSCOPY WITH PARTIAL LATERAL MENISCECTOMY;  Surgeon: Paralee Cancel, MD;  Location: [facility];  Service: Orthopedics;  Laterality: Right;  Adductor Block  ? MEDIAL PARTIAL KNEE REPLACEMENT Right 05/18/2017  ? Procedure: RIGHT MEDIAL PARTIAL KNEE REPLACEMENT;  Surgeon: Paralee Cancel, MD;  Location: [facility];  Service: Orthopedics;  Laterality:  Right;  Adductor Block  ? TUBAL LIGATION    ? ? ?FAMILY HISTORY: ?Family History  ?Problem Relation Age of Onset  ? Scoliosis Mother   ? Mitral valve prolapse Mother   ? Colon cancer Maternal Aunt   ? ? ?SOCIAL HISTORY: ?Social History  ? ?Socioeconomic History  ? Marital status: Married  ?  Spouse name: Not on file  ? Number of children: 3  ? Years of education: Not on file  ? Highest education level: Not on file  ?Occupational History  ? Occupation: retired  ?Tobacco Use  ? Smoking status: Never  ? Smokeless tobacco: Never  ?Vaping Use  ? Vaping Use: Never used  ?Substance and Sexual Activity  ? Alcohol use: No  ?  Alcohol/week: 0.0 standard drinks  ? Drug use: No  ? Sexual activity: Yes  ?Other Topics Concern  ? Not on file  ?Social History Narrative  ? Not on file  ? ?Social Determinants of Health  ? ?Financial Resource Strain: Low Risk   ? Difficulty of Paying Living Expenses: Not hard at all  ?Food Insecurity: No Food Insecurity  ? Worried About Charity fundraiser in the Last Year: Never true  ? Ran Out of Food in the Last Year: Never true  ?Transportation Needs: No Transportation Needs  ? Lack of Transportation (Medical): No  ? Lack of Transportation (Non-Medical): No  ?Physical Activity: Inactive  ? Days of Exercise per Week: 0 days  ? Minutes of Exercise per Session: 0 min  ?Stress: No Stress Concern Present  ? Feeling of Stress : Not at all  ?Social Connections: Moderately Isolated  ? Frequency of Communication with Friends and Family: More than three times a  week  ? Frequency of Social Gatherings with Friends and Family: More than three times a week  ? Attends Religious Services: Never  ? Active Member of Clubs or Organizations: No  ? Attends Club or [employer]

## 2021-09-25 NOTE — Patient Instructions (Signed)
Please continue using your CPAP regularly. While your insurance requires that you use CPAP at least 4 hours each night on 70% of the nights, I recommend, that you not skip any nights and use it throughout the night if you can. Getting used to CPAP and staying with the treatment long term does take time and patience and discipline. Untreated obstructive sleep apnea when it is moderate to severe can have an adverse impact on cardiovascular health and raise her risk for heart disease, arrhythmias, hypertension, congestive heart failure, stroke and diabetes. Untreated obstructive sleep apnea causes sleep disruption, nonrestorative sleep, and sleep deprivation. This can have an impact on your day to day functioning and cause daytime sleepiness and impairment of cognitive function, memory loss, mood disturbance, and problems focussing. Using CPAP regularly can improve these symptoms. ? ?I will recheck your blood work. Please continue conversation with PCP regarding fatigue. Continue trazodone and pramipexole as prescribed.  ? ?Follow up with Dr Brett Fairy in 6 months,, consider Inspire ?

## 2021-09-25 NOTE — Progress Notes (Signed)
CM sent to [facility] for new order ?

## 2021-09-26 LAB — IRON,TIBC AND FERRITIN PANEL
Ferritin: 28 ng/mL (ref 15–150)
Iron Saturation: 25 % (ref 15–55)
Iron: 99 ug/dL (ref 27–139)
Total Iron Binding Capacity: 400 ug/dL (ref 250–450)
UIBC: 301 ug/dL (ref 118–369)

## 2021-09-26 NOTE — Progress Notes (Signed)
Normal iron panel but ferritin is below 50. I would recommend to take an oral iron supplement or prenatal vitamin in the morning and retest in 3 months. Marland Kitchen

## 2021-12-25 ENCOUNTER — Other Ambulatory Visit: Payer: Self-pay

## 2021-12-25 NOTE — Telephone Encounter (Signed)
Patient has been rescheduled.

## 2021-12-27 ENCOUNTER — Ambulatory Visit: Payer: Medicare Other | Admitting: Family Medicine

## 2022-01-10 ENCOUNTER — Ambulatory Visit: Payer: Medicare Other | Admitting: Family Medicine

## 2022-01-13 ENCOUNTER — Other Ambulatory Visit (HOSPITAL_COMMUNITY): Payer: Self-pay | Admitting: Family Medicine

## 2022-01-13 DIAGNOSIS — Z1231 Encounter for screening mammogram for malignant neoplasm of breast: Secondary | ICD-10-CM

## 2022-01-14 ENCOUNTER — Encounter: Payer: Self-pay | Admitting: Family Medicine

## 2022-01-14 ENCOUNTER — Ambulatory Visit (INDEPENDENT_AMBULATORY_CARE_PROVIDER_SITE_OTHER): Payer: Medicare Other | Admitting: Family Medicine

## 2022-01-14 VITALS — BP 118/68 | HR 87 | Temp 98.1°F | Resp 16 | Ht 62.0 in | Wt 137.0 lb

## 2022-01-14 DIAGNOSIS — M810 Age-related osteoporosis without current pathological fracture: Secondary | ICD-10-CM

## 2022-01-14 DIAGNOSIS — D509 Iron deficiency anemia, unspecified: Secondary | ICD-10-CM | POA: Diagnosis not present

## 2022-01-14 DIAGNOSIS — E785 Hyperlipidemia, unspecified: Secondary | ICD-10-CM

## 2022-01-14 DIAGNOSIS — G2581 Restless legs syndrome: Secondary | ICD-10-CM

## 2022-01-14 LAB — IBC + FERRITIN
Ferritin: 31.3 ng/mL (ref 10.0–291.0)
Iron: 94 ug/dL (ref 42–145)
Saturation Ratios: 19 % — ABNORMAL LOW (ref 20.0–50.0)
TIBC: 495.6 ug/dL — ABNORMAL HIGH (ref 250.0–450.0)
Transferrin: 354 mg/dL (ref 212.0–360.0)

## 2022-01-14 LAB — CBC WITH DIFFERENTIAL/PLATELET
Basophils Absolute: 0.1 10*3/uL (ref 0.0–0.1)
Basophils Relative: 1.4 % (ref 0.0–3.0)
Eosinophils Absolute: 0.1 10*3/uL (ref 0.0–0.7)
Eosinophils Relative: 1.3 % (ref 0.0–5.0)
HCT: 41.7 % (ref 36.0–46.0)
Hemoglobin: 13.9 g/dL (ref 12.0–15.0)
Lymphocytes Relative: 31.8 % (ref 12.0–46.0)
Lymphs Abs: 1.5 10*3/uL (ref 0.7–4.0)
MCHC: 33.2 g/dL (ref 30.0–36.0)
MCV: 93.3 fl (ref 78.0–100.0)
Monocytes Absolute: 0.4 10*3/uL (ref 0.1–1.0)
Monocytes Relative: 8 % (ref 3.0–12.0)
Neutro Abs: 2.8 10*3/uL (ref 1.4–7.7)
Neutrophils Relative %: 57.5 % (ref 43.0–77.0)
Platelets: 197 10*3/uL (ref 150.0–400.0)
RBC: 4.47 Mil/uL (ref 3.87–5.11)
RDW: 12.7 % (ref 11.5–15.5)
WBC: 4.8 10*3/uL (ref 4.0–10.5)

## 2022-01-14 LAB — HEPATIC FUNCTION PANEL
ALT: 24 U/L (ref 0–35)
AST: 29 U/L (ref 0–37)
Albumin: 4.7 g/dL (ref 3.5–5.2)
Alkaline Phosphatase: 71 U/L (ref 39–117)
Bilirubin, Direct: 0.2 mg/dL (ref 0.0–0.3)
Total Bilirubin: 1.2 mg/dL (ref 0.2–1.2)
Total Protein: 7.8 g/dL (ref 6.0–8.3)

## 2022-01-14 LAB — LIPID PANEL
Cholesterol: 277 mg/dL — ABNORMAL HIGH (ref 0–200)
HDL: 76.2 mg/dL (ref >39.00)
LDL Cholesterol: 186 mg/dL — ABNORMAL HIGH (ref 0–99)
NonHDL: 200.35
Total CHOL/HDL Ratio: 4
Triglycerides: 74 mg/dL (ref 0.0–149.0)
VLDL: 14.8 mg/dL (ref 0.0–40.0)

## 2022-01-14 LAB — BASIC METABOLIC PANEL
BUN: 18 mg/dL (ref 6–23)
CO2: 25 mEq/L (ref 19–32)
Calcium: 9.5 mg/dL (ref 8.4–10.5)
Chloride: 104 mEq/L (ref 96–112)
Creatinine, Ser: 0.69 mg/dL (ref 0.40–1.20)
GFR: 89.9 mL/min (ref >60.00)
Glucose, Bld: 91 mg/dL (ref 70–99)
Potassium: 4.2 mEq/L (ref 3.5–5.1)
Sodium: 138 mEq/L (ref 135–145)

## 2022-01-14 LAB — MAGNESIUM: Magnesium: 2.1 mg/dL (ref 1.5–2.5)

## 2022-01-14 LAB — TSH: TSH: 2.33 u[IU]/mL (ref 0.35–5.50)

## 2022-01-14 LAB — VITAMIN D 25 HYDROXY (VIT D DEFICIENCY, FRACTURES): VITD: 30.71 ng/mL (ref 30.00–100.00)

## 2022-01-14 MED ORDER — PRAMIPEXOLE DIHYDROCHLORIDE 1 MG PO TABS: 1.5000 mg | ORAL_TABLET | Freq: Every day | ORAL | 3 refills | Status: DC

## 2022-01-14 MED ORDER — TRAZODONE HCL 50 MG PO TABS: 100.0000 mg | ORAL_TABLET | Freq: Every day | ORAL | 1 refills | Status: DC

## 2022-01-14 NOTE — Progress Notes (Signed)
   Subjective:    Patient ID: Debra Matthews, female    DOB: Sep 29, 1955, 66 y.o.   MRN: [id number]  HPI Hyperlipidemia- due for repeat labs.  Last labs 2020.  Last reading total cholesterol 222, LDL 119.  Has been controlling w/ diet and exercise.  Denies CP, SOB, abd pain, N/V.  Iron deficiency anemia- overdue for CBC.  Last iron panel was WNL.  Pt has had multiple iron infusions in the past.  No change in energy level recently.    Osteoporosis- UTD on DEXA.  Due to check Vit D.  RLS- chronic problem, on Pramipexole per Dr Brett Fairy.  Pt has been getting iron infusions to help manage sxs.  Taking Trazodone 2-3x/week but admits to sleeping terribly the other nights.  Review of Systems For ROS see HPI     Objective:   Physical Exam Vitals reviewed.  Constitutional:      General: She is not in acute distress.    Appearance: Normal appearance. She is well-developed.  HENT:     Head: Normocephalic and atraumatic.  Eyes:     Conjunctiva/sclera: Conjunctivae normal.     Pupils: Pupils are equal, round, and reactive to light.  Neck:     Thyroid: No thyromegaly.  Cardiovascular:     Rate and Rhythm: Normal rate and regular rhythm.     Pulses: Normal pulses.     Heart sounds: Normal heart sounds. No murmur heard. Pulmonary:     Effort: Pulmonary effort is normal. No respiratory distress.     Breath sounds: Normal breath sounds.  Abdominal:     General: There is no distension.     Palpations: Abdomen is soft.     Tenderness: There is no abdominal tenderness.  Musculoskeletal:     Cervical back: Normal range of motion and neck supple.     Right lower leg: No edema.     Left lower leg: No edema.  Lymphadenopathy:     Cervical: No cervical adenopathy.  Skin:    General: Skin is warm and dry.  Neurological:     Mental Status: She is alert and oriented to person, place, and time.  Psychiatric:        Behavior: Behavior normal.          Assessment & Plan:

## 2022-01-14 NOTE — Patient Instructions (Addendum)
Follow up in 1 year or as needed We'll notify you of your lab results and make any changes if needed Keep up the good work on healthy diet and regular exercise- you look great!! Call with any questions or concerns Stay Safe!  Stay Healthy! Enjoy the rest of your summer!!!

## 2022-01-15 ENCOUNTER — Encounter: Payer: Self-pay | Admitting: Neurology

## 2022-01-15 ENCOUNTER — Encounter: Payer: Self-pay | Admitting: Family Medicine

## 2022-01-15 ENCOUNTER — Telehealth: Payer: Self-pay | Admitting: Neurology

## 2022-01-15 ENCOUNTER — Other Ambulatory Visit: Payer: Self-pay

## 2022-01-15 DIAGNOSIS — R748 Abnormal levels of other serum enzymes: Secondary | ICD-10-CM

## 2022-01-15 MED ORDER — ROSUVASTATIN CALCIUM 20 MG PO TABS: 20.0000 mg | ORAL_TABLET | Freq: Every day | ORAL | 3 refills | Status: DC

## 2022-01-15 NOTE — Progress Notes (Signed)
//  Spoke to the pt and advised of lab results  Lab order has been placed and apt has been made for /02/18/22.

## 2022-01-15 NOTE — Telephone Encounter (Signed)
Patient presented with high TIBC, ferritin now above 30, we like to to be a bit higher. Can she start oral iron or does she have a malabsorption history?  In that case may benefit from another IV supplement in 4-8 weeks.

## 2022-01-15 NOTE — Telephone Encounter (Signed)
Mychart message sent to the patient updating

## 2022-01-19 NOTE — Assessment & Plan Note (Signed)
Last LDL 119.  She has been controlling w/ diet and exercise but she is overdue for lipid panel.  Check labs and determine if medication is needed.

## 2022-01-19 NOTE — Assessment & Plan Note (Signed)
Ongoing issue for pt.  She is overdue for CBC.  Last iron panel WNL.  Neuro asked for updated iron panel to determine if another infusion is needed.  Will check labs and forward results.

## 2022-01-19 NOTE — Assessment & Plan Note (Signed)
Chronic problem.  Currently following w/ Dr Brett Fairy.  She is on Pramipexole and having iron infusions to manage sxs.  She is taking Trazodone 2-3x/week b/c she is fearful of becoming dependent on medication.  Encouraged her to take medication nightly if needed.

## 2022-01-19 NOTE — Assessment & Plan Note (Signed)
UTD on DEXA.  Recheck Vit D level and replete prn.

## 2022-01-23 ENCOUNTER — Ambulatory Visit (HOSPITAL_COMMUNITY)
Admission: RE | Admit: 2022-01-23 | Discharge: 2022-01-23 | Disposition: A | Discharge status: Home / Self Care | Payer: Medicare Other | Source: Ambulatory Visit | Attending: Family Medicine | Admitting: Family Medicine

## 2022-01-23 DIAGNOSIS — Z1231 Encounter for screening mammogram for malignant neoplasm of breast: Secondary | ICD-10-CM | POA: Insufficient documentation

## 2022-02-18 ENCOUNTER — Other Ambulatory Visit: Payer: Self-pay

## 2022-02-18 ENCOUNTER — Other Ambulatory Visit (INDEPENDENT_AMBULATORY_CARE_PROVIDER_SITE_OTHER): Payer: Medicare Other

## 2022-02-18 DIAGNOSIS — R748 Abnormal levels of other serum enzymes: Secondary | ICD-10-CM

## 2022-02-18 LAB — HEPATIC FUNCTION PANEL
ALT: 21 U/L (ref 0–35)
AST: 24 U/L (ref 0–37)
Albumin: 4.2 g/dL (ref 3.5–5.2)
Alkaline Phosphatase: 60 U/L (ref 39–117)
Bilirubin, Direct: 0.2 mg/dL (ref 0.0–0.3)
Total Bilirubin: 1 mg/dL (ref 0.2–1.2)
Total Protein: 7 g/dL (ref 6.0–8.3)

## 2022-02-19 NOTE — Progress Notes (Signed)
Pt seen results Via my chart  

## 2022-02-24 ENCOUNTER — Encounter: Payer: Self-pay | Admitting: Family Medicine

## 2022-02-26 ENCOUNTER — Ambulatory Visit (INDEPENDENT_AMBULATORY_CARE_PROVIDER_SITE_OTHER): Payer: Medicare Other

## 2022-02-26 VITALS — Ht 62.0 in | Wt 136.0 lb

## 2022-02-26 DIAGNOSIS — Z0000 Encounter for general adult medical examination without abnormal findings: Secondary | ICD-10-CM

## 2022-02-26 NOTE — Progress Notes (Signed)
Subjective:   Debra Matthews is a 66 y.o. female who presents for Medicare Annual (Subsequent) preventive examination.   Virtual Visit via Telephone Note  I connected with  [name] on 02/26/22 at 10:30 AM EDT by telephone and verified that I am speaking with the correct person using two identifiers.  Location: Patient: home  Provider: [place]  Persons participating in the virtual visit: patient/Nurse Health Advisor   I discussed the limitations, risks, security and privacy concerns of performing an evaluation and management service by telephone and the availability of in person appointments. The patient expressed understanding and agreed to proceed.  Interactive audio and video telecommunications were attempted between this nurse and patient, however failed, due to patient having technical difficulties OR patient did not have access to video capability.  We continued and completed visit with audio only.  Some vital signs may be absent or patient reported.   [name], LPN  Review of Systems     Cardiac Risk Factors include: advanced age (>53mn, >>68women);dyslipidemia     Objective:    Today's Vitals   02/26/22 1032  Weight: 136 lb (61.7 kg)  Height: '5\' 2"'$  (1.575 m)   Body mass index is 24.87 kg/m.     02/26/2022   10:36 AM 10/01/2020    2:19 PM 05/18/2017    7:55 PM 05/18/2017    7:19 AM 05/13/2017   11:19 AM  Advanced Directives  Does Patient Have a Medical Advance Directive? Yes Yes  Yes Yes  Type of AParamedicof ACres[place]Living will [place] will [place] will [place] Does patient want to make changes to medical advance directive?   No - Patient declined  No - Patient declined  Copy of [place] Chart? No - copy requested No - copy requested No - copy requested No - copy requested No - copy  requested    Current Medications (verified) Outpatient Encounter Medications as of 02/26/2022  Medication Sig   cetirizine (ZYRTEC) 10 MG tablet Take 1 tablet (10 mg total) by mouth daily. (Patient taking differently: Take 10 mg by mouth as needed for allergies.)   fluticasone (FLONASE) 50 MCG/ACT nasal spray SPRAY 2 SPRAYS INTO EACH NOSTRIL EVERY DAY   pramipexole (MIRAPEX) 1 MG tablet Take 1.5-3 tablets (1.5-3 mg total) by mouth daily. 1.5 tablet daily with additional 1.5 tab as needed for break-through   rosuvastatin (CRESTOR) 20 MG tablet Take 1 tablet (20 mg total) by mouth daily.   traZODone (DESYREL) 50 MG tablet Take 2 tablets (100 mg total) by mouth at bedtime.   Wheat Dextrin (BENEFIBER PO) Take 1 scoop by mouth 2 (two) times daily as needed (constipation).    clobetasol ointment (TEMOVATE) 08.54% Apply 1 application topically as needed for rash.   No facility-administered encounter medications on file as of 02/26/2022.    Allergies (verified) Pseudoephedrine and Prednisone   History: Past Medical History:  Diagnosis Date   Abdominal pain, epigastric    Anemia    hx of iron def.   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)    GERD (gastroesophageal reflux disease)    Headache    hx  of    History of chicken pox    History of shingles    RLS (restless legs syndrome)    Subchondral insufficiency fracture of femoral condyle, right, initial encounter ([facility]) 07/15/2016   Past Surgical History:  Procedure Laterality Date   KNEE ARTHROSCOPY WITH MENISCAL REPAIR Right 05/18/2017   Procedure: KNEE ARTHROSCOPY WITH PARTIAL LATERAL MENISCECTOMY;  Surgeon: Paralee Cancel, MD;  Location: [facility];  Service: Orthopedics;  Laterality: Right;  Adductor Block   MEDIAL PARTIAL KNEE REPLACEMENT Right 05/18/2017   Procedure: RIGHT MEDIAL PARTIAL  KNEE REPLACEMENT;  Surgeon: Paralee Cancel, MD;  Location: [facility];  Service: Orthopedics;  Laterality: Right;  Adductor Block   TUBAL LIGATION     Family History  Problem Relation Age of Onset   Scoliosis Mother    Mitral valve prolapse Mother    Colon cancer Maternal Aunt    Social History   Socioeconomic History   Marital status: Married    Spouse name: Not on file   Number of children: 3   Years of education: Not on file   Highest education level: Not on file  Occupational History   Occupation: retired  Tobacco Use   Smoking status: Never   Smokeless tobacco: Never  Vaping Use   Vaping Use: Never used  Substance and Sexual Activity   Alcohol use: No    Alcohol/week: 0.0 standard drinks of alcohol   Drug use: No   Sexual activity: Yes  Other Topics Concern   Not on file  Social History Narrative   Not on file   Social Determinants of Health   Financial Resource Strain: Low Risk  (02/26/2022)   Overall Financial Resource Strain (CARDIA)    Difficulty of Paying Living Expenses: Not hard at all  Food Insecurity: No Food Insecurity (02/26/2022)   Hunger Vital Sign    Worried About Running Out of Food in the Last Year: Never true    Max in the Last Year: Never true  Transportation Needs: No Transportation Needs (02/26/2022)   PRAPARE - Hydrologist (Medical): No    Lack of Transportation (Non-Medical): No  Physical Activity: Insufficiently Active (02/26/2022)   Exercise Vital Sign    Days of Exercise per Week: 4 days    Minutes of Exercise per Session: 30 min  Stress: No Stress Concern Present (02/26/2022)   [place]    Feeling of Stress : Not at all  Social Connections: Moderately Integrated (02/26/2022)   Social Connection and Isolation Panel [NHANES]    Frequency of Communication with Friends and Family: More than three times a week    Frequency of Social  Gatherings with Friends and Family: More than three times a week    Attends Religious Services: 1 to 4 times per year    Active Member of [employer] or Organizations: No    Attends Music therapist: Never    Marital Status: Married    Tobacco Counseling Counseling given: Not Answered   Clinical Intake:  Pre-visit preparation completed: Yes  Pain : No/denies pain     Nutritional Risks: None Diabetes: No  How often do you need to have someone help you when you read instructions, pamphlets, or other written materials from your doctor or pharmacy?: 1 - Never  Diabetic?no   Interpreter Needed?: No  Information entered by :: Jadene Pierini  LPN   Activities of Daily Living  02/26/2022   10:36 AM  In your present state of health, do you have any difficulty performing the following activities:  Hearing? 0  Vision? 0  Difficulty concentrating or making decisions? 0  Walking or climbing stairs? 0  Dressing or bathing? 0  Doing errands, shopping? 0  Preparing Food and eating ? N  Using the Toilet? N  In the past six months, have you accidently leaked urine? N  Do you have problems with loss of bowel control? N  Managing your Medications? N  Managing your Finances? N  Housekeeping or managing your Housekeeping? N    Patient Care Team: Midge Minium, MD as PCP - General (Family Medicine) Jacquiline Doe Marko Stai, MD as Consulting Physician (Internal Medicine) Vanessa Kick, MD as Consulting Physician (Obstetrics and Gynecology) Madelin Rear, [facility] (Inactive) as Pharmacist (Pharmacist)  Indicate any recent Medical Services you may have received from other than [facility] providers in the past year (date may be approximate).     Assessment:   This is a routine wellness examination for Shemeka.  Hearing/Vision screen Vision Screening - Comments:: Annual eye exams wears glasses   Dietary issues and exercise activities discussed: Current Exercise Habits: Home exercise  routine, Type of exercise: walking, Time (Minutes): 30, Frequency (Times/Week): 4, Weekly Exercise (Minutes/Week): 120, Intensity: Mild, Exercise limited by: None identified   Goals Addressed             This Visit's Progress    Patient Stated   On track    Lose 8 pounds & drink more water       Depression Screen    02/26/2022   10:34 AM 01/14/2022   10:10 AM 10/22/2020    9:31 AM 10/09/2020    9:53 AM 10/01/2020    2:22 PM 05/18/2020    2:36 PM 11/18/2019    1:38 PM  PHQ 2/9 Scores  PHQ - 2 Score 0 0 0 0 0 0 0  PHQ- 9 Score 0 0 0   0 0    Fall Risk    02/26/2022   10:32 AM 01/14/2022   10:10 AM 10/22/2020    9:31 AM 10/09/2020    9:53 AM 10/01/2020    2:21 PM  Fall Risk   Falls in the past year? 0 0 0 0 1  Number falls in past yr: 0 0 0 0 0  Injury with Fall? 0 0 0 0 0  Risk for fall due to : No Fall Risks No Fall Risks No Fall Risks    Follow up Falls prevention discussed Falls evaluation completed  Falls evaluation completed Falls prevention discussed    FALL RISK PREVENTION PERTAINING TO THE HOME:  Any stairs in or around the home? No  If so, are there any without handrails? No  Home free of loose throw rugs in walkways, pet beds, electrical cords, etc? Yes  Adequate lighting in your home to reduce risk of falls? Yes   ASSISTIVE DEVICES UTILIZED TO PREVENT FALLS:  Life alert? No  Use of a cane, walker or w/c? No  Grab bars in the bathroom? No  Shower chair or bench in shower? Yes  Elevated toilet seat or a handicapped toilet? No          02/26/2022   10:36 AM  6CIT Screen  What Year? 0 points  What month? 0 points  What time? 0 points  Count back from 20 0 points  Months in reverse 0 points  Repeat phrase 0 points  Total Score 0 points    Immunizations Immunization History  Administered Date(s) Administered   Fluad Quad(high Dose 65+) 05/18/2020   Influenza,inj,Quad PF,6+ Mos 06/11/2016, 05/19/2017, 05/19/2018, 05/19/2019   Influenza-Unspecified  03/16/2016, 05/16/2017   Moderna Sars-Covid-2 Vaccination 09/01/2019, 10/10/2019, 04/21/2020   PFIZER(Purple Top)SARS-COV-2 Vaccination 09/01/2019, 10/10/2019, 04/21/2020, 11/01/2020, 04/18/2021   Tdap 02/11/2016   Zoster Recombinat (Shingrix) 06/12/2017, 09/17/2017    TDAP status: Up to date  Flu Vaccine status: Up to date  Pneumococcal vaccine status: Due, Education has been provided regarding the importance of this vaccine. Advised may receive this vaccine at local pharmacy or Health Dept. Aware to provide a copy of the vaccination record if obtained from local pharmacy or Health Dept. Verbalized acceptance and understanding.  Covid-19 vaccine status: Completed vaccines  Qualifies for Shingles Vaccine? Yes   Zostavax completed No   Shingrix Completed?: No.    Education has been provided regarding the importance of this vaccine. Patient has been advised to call insurance company to determine out of pocket expense if they have not yet received this vaccine. Advised may also receive vaccine at local pharmacy or Health Dept. Verbalized acceptance and understanding.  Screening Tests Health Maintenance  Topic Date Due   INFLUENZA VACCINE  01/14/2022   Pneumonia Vaccine 49+ Years old (1 - PCV) 01/15/2023 (Originally 10/09/2019)   MAMMOGRAM  01/24/2023   COLONOSCOPY (Pts 45-75yr Insurance coverage will need to be confirmed)  03/20/2025   TETANUS/TDAP  02/10/2026   DEXA SCAN  Completed   Zoster Vaccines- Shingrix  Completed   HPV VACCINES  Aged Out   COVID-19 Vaccine  Discontinued   Hepatitis C Screening  Discontinued    Health Maintenance  Health Maintenance Due  Topic Date Due   INFLUENZA VACCINE  01/14/2022    Colorectal cancer screening: Type of screening: Colonoscopy. Completed 03/31/2015. Repeat every 10 years  Mammogram status: Completed 0/03/2022. Repeat every year  Bone Density status: Completed 10/25/2020. Results reflect: Bone density results: OSTEOPOROSIS. Repeat  every 2 years.  Lung Cancer Screening: (Low Dose CT Chest recommended if Age 67-80years, 30 pack-year currently smoking OR have quit w/in 15years.) does not qualify.   Lung Cancer Screening Referral: n/a  Additional Screening:  Hepatitis C Screening: does qualify;   Vision Screening: Recommended annual ophthalmology exams for early detection of glaucoma and other disorders of the eye. Is the patient up to date with their annual eye exam?  Yes  Who is the provider or what is the name of the office in which the patient attends annual eye exams? Dr.Cotter  If pt is not established with a provider, would they like to be referred to a provider to establish care? No .   Dental Screening: Recommended annual dental exams for proper oral hygiene  Community Resource Referral / Chronic Care Management: CRR required this visit?  No   CCM required this visit?  No      Plan:     I have personally reviewed and noted the following in the patient's chart:   Medical and social history Use of alcohol, tobacco or illicit drugs  Current medications and supplements including opioid prescriptions. Patient is not currently taking opioid prescriptions. Functional ability and status Nutritional status Physical activity Advanced directives List of other physicians Hospitalizations, surgeries, and ER visits in previous 12 months Vitals Screenings to include cognitive, depression, and falls Referrals and appointments  In addition, I have reviewed and discussed with patient certain preventive protocols, quality metrics, and best practice recommendations. A  written personalized care plan for preventive services as well as general preventive health recommendations were provided to patient.     [name], LPN   [id number]   Nurse Notes: none

## 2022-02-26 NOTE — Patient Instructions (Signed)
Debra Matthews , Thank you for taking time to come for your Medicare Wellness Visit. I appreciate your ongoing commitment to your health goals. Please review the following plan we discussed and let me know if I can assist you in the future.   Screening recommendations/referrals: Colonoscopy: 03/21/2015  due 2026 Mammogram: 01/23/2022 Bone Density: 10/25/2020 Recommended yearly ophthalmology/optometry visit for glaucoma screening and checkup Recommended yearly dental visit for hygiene and checkup  Vaccinations: Influenza vaccine: due in the fall  Pneumococcal vaccine: due  Tdap vaccine: 02/11/2016 Shingles vaccine: completd    Covid-19:completed   Advanced directives: Please bring a copy of your health care power of attorney and living will to the office to be added to your chart at your convenience.   Conditions/risks identified: Aim for 30 minutes of exercise or brisk walking, 6-8 glasses of water, and 5 servings of fruits and vegetables each day.   Next appointment: Follow up in one year for your annual wellness visit    Preventive Care 65 Years and Older, Female Preventive care refers to lifestyle choices and visits with your health care provider that can promote health and wellness. What does preventive care include? A yearly physical exam. This is also called an annual well check. Dental exams once or twice a year. Routine eye exams. Ask your health care provider how often you should have your eyes checked. Personal lifestyle choices, including: Daily care of your teeth and gums. Regular physical activity. Eating a healthy diet. Avoiding tobacco and drug use. Limiting alcohol use. Practicing safe sex. Taking low-dose aspirin every day. Taking vitamin and mineral supplements as recommended by your health care provider. What happens during an annual well check? The services and screenings done by your health care provider during your annual well check will depend on your age,  overall health, lifestyle risk factors, and family history of disease. Counseling  Your health care provider may ask you questions about your: Alcohol use. Tobacco use. Drug use. Emotional well-being. Home and relationship well-being. Sexual activity. Eating habits. History of falls. Memory and ability to understand (cognition). Work and work Statistician. Reproductive health. Screening  You may have the following tests or measurements: Height, weight, and BMI. Blood pressure. Lipid and cholesterol levels. These may be checked every 5 years, or more frequently if you are over 37 years old. Skin check. Lung cancer screening. You may have this screening every year starting at age 49 if you have a 30-pack-year history of smoking and currently smoke or have quit within the past 15 years. Fecal occult blood test (FOBT) of the stool. You may have this test every year starting at age 61. Flexible sigmoidoscopy or colonoscopy. You may have a sigmoidoscopy every 5 years or a colonoscopy every 10 years starting at age 21. Hepatitis C blood test. Hepatitis B blood test. Sexually transmitted disease (STD) testing. Diabetes screening. This is done by checking your blood sugar (glucose) after you have not eaten for a while (fasting). You may have this done every 1-3 years. Bone density scan. This is done to screen for osteoporosis. You may have this done starting at age 36. Mammogram. This may be done every 1-2 years. Talk to your health care provider about how often you should have regular mammograms. Talk with your health care provider about your test results, treatment options, and if necessary, the need for more tests. Vaccines  Your health care provider may recommend certain vaccines, such as: Influenza vaccine. This is recommended every year. Tetanus, diphtheria, and  acellular pertussis (Tdap, Td) vaccine. You may need a Td booster every 10 years. Zoster vaccine. You may need this after age  38. Pneumococcal 13-valent conjugate (PCV13) vaccine. One dose is recommended after age 44. Pneumococcal polysaccharide (PPSV23) vaccine. One dose is recommended after age 68. Talk to your health care provider about which screenings and vaccines you need and how often you need them. This information is not intended to replace advice given to you by your health care provider. Make sure you discuss any questions you have with your health care provider. Document Released: 06/29/2015 Document Revised: 02/20/2016 Document Reviewed: 04/03/2015 Elsevier Interactive Patient Education  2017 [place] Prevention in the Home Falls can cause injuries. They can happen to people of all ages. There are many things you can do to make your home safe and to help prevent falls. What can I do on the outside of my home? Regularly fix the edges of walkways and driveways and fix any cracks. Remove anything that might make you trip as you walk through a door, such as a raised step or threshold. Trim any bushes or trees on the path to your home. Use bright outdoor lighting. Clear any walking paths of anything that might make someone trip, such as rocks or tools. Regularly check to see if handrails are loose or broken. Make sure that both sides of any steps have handrails. Any raised decks and porches should have guardrails on the edges. Have any leaves, snow, or ice cleared regularly. Use sand or salt on walking paths during winter. Clean up any spills in your garage right away. This includes oil or grease spills. What can I do in the bathroom? Use night lights. Install grab bars by the toilet and in the tub and shower. Do not use towel bars as grab bars. Use non-skid mats or decals in the tub or shower. If you need to sit down in the shower, use a plastic, non-slip stool. Keep the floor dry. Clean up any water that spills on the floor as soon as it happens. Remove soap buildup in the tub or shower  regularly. Attach bath mats securely with double-sided non-slip rug tape. Do not have throw rugs and other things on the floor that can make you trip. What can I do in the bedroom? Use night lights. Make sure that you have a light by your bed that is easy to reach. Do not use any sheets or blankets that are too big for your bed. They should not hang down onto the floor. Have a firm chair that has side arms. You can use this for support while you get dressed. Do not have throw rugs and other things on the floor that can make you trip. What can I do in the kitchen? Clean up any spills right away. Avoid walking on wet floors. Keep items that you use a lot in easy-to-reach places. If you need to reach something above you, use a strong step stool that has a grab bar. Keep electrical cords out of the way. Do not use floor polish or wax that makes floors slippery. If you must use wax, use non-skid floor wax. Do not have throw rugs and other things on the floor that can make you trip. What can I do with my stairs? Do not leave any items on the stairs. Make sure that there are handrails on both sides of the stairs and use them. Fix handrails that are broken or loose. Make sure that  handrails are as long as the stairways. Check any carpeting to make sure that it is firmly attached to the stairs. Fix any carpet that is loose or worn. Avoid having throw rugs at the top or bottom of the stairs. If you do have throw rugs, attach them to the floor with carpet tape. Make sure that you have a light switch at the top of the stairs and the bottom of the stairs. If you do not have them, ask someone to add them for you. What else can I do to help prevent falls? Wear shoes that: Do not have high heels. Have rubber bottoms. Are comfortable and fit you well. Are closed at the toe. Do not wear sandals. If you use a stepladder: Make sure that it is fully opened. Do not climb a closed stepladder. Make sure that  both sides of the stepladder are locked into place. Ask someone to hold it for you, if possible. Clearly mark and make sure that you can see: Any grab bars or handrails. First and last steps. Where the edge of each step is. Use tools that help you move around (mobility aids) if they are needed. These include: Canes. Walkers. Scooters. Crutches. Turn on the lights when you go into a dark area. Replace any light bulbs as soon as they burn out. Set up your furniture so you have a clear path. Avoid moving your furniture around. If any of your floors are uneven, fix them. If there are any pets around you, be aware of where they are. Review your medicines with your doctor. Some medicines can make you feel dizzy. This can increase your chance of falling. Ask your doctor what other things that you can do to help prevent falls. This information is not intended to replace advice given to you by your health care provider. Make sure you discuss any questions you have with your health care provider. Document Released: 03/29/2009 Document Revised: 11/08/2015 Document Reviewed: 07/07/2014 Elsevier Interactive Patient Education  2017 [employer].

## 2022-03-17 ENCOUNTER — Encounter: Payer: Self-pay | Admitting: Podiatry

## 2022-03-17 ENCOUNTER — Ambulatory Visit (INDEPENDENT_AMBULATORY_CARE_PROVIDER_SITE_OTHER): Payer: Medicare Other

## 2022-03-17 ENCOUNTER — Ambulatory Visit (INDEPENDENT_AMBULATORY_CARE_PROVIDER_SITE_OTHER): Payer: Medicare Other | Admitting: Podiatry

## 2022-03-17 DIAGNOSIS — M779 Enthesopathy, unspecified: Secondary | ICD-10-CM

## 2022-03-17 DIAGNOSIS — M722 Plantar fascial fibromatosis: Secondary | ICD-10-CM

## 2022-03-17 DIAGNOSIS — D3613 Benign neoplasm of peripheral nerves and autonomic nervous system of lower limb, including hip: Secondary | ICD-10-CM

## 2022-03-17 DIAGNOSIS — D3610 Benign neoplasm of peripheral nerves and autonomic nervous system, unspecified: Secondary | ICD-10-CM

## 2022-03-17 MED ORDER — TRIAMCINOLONE ACETONIDE 10 MG/ML IJ SUSP
20.0000 mg | Freq: Once | INTRAMUSCULAR | Status: AC
  Administered 2022-03-17: 20 mg

## 2022-03-17 NOTE — Progress Notes (Signed)
Subjective:   Patient ID: Debra Matthews, female   DOB: 66 y.o.   MRN: [id number]   HPI Patient presents stating she is developed a lot of pain in her right heel that has been very sore recently and she has a lot of burning shooting pain between the third and fourth toes right second and third toes left with history of neuroma done third interspace left which is resolved the symptoms she was getting in that area.  No other change health history   ROS      Objective:  Physical Exam  Neurovascular status intact with patient found to have exquisite discomfort plantar aspect right heel at the insertion of the tendon into the calcaneus with inflammation pain of the third interspace right second interspace left with rotating ranging pain and what appears to be relative palpable neuroma symptomatology     Assessment:  Probability for acute fasciitis right with probability for neuroma symptomatology bilateral     Plan:  H&P reviewed conditions and explained each condition separately.  Today I went ahead and I did sterile prep injected the right plantar fascia 3 mg Kenalog 5 mg Xylocaine and I carefully injected the third interspace right second interspace left 3 mg Dexasone Kenalog 5 mg Xylocaine and I want to see the symptoms and how they respond over the next 2 weeks.  Reappoint at that time for reevaluation also dispensed a fascial brace fitting it properly to the foot and arch to lift up the arch and take pressure off the insertion of the tendon calcaneus  X-rays indicate small plantar spur formation no indications of arthritis or pathology of the joint surfaces

## 2022-03-25 ENCOUNTER — Telehealth: Payer: Self-pay | Admitting: Family Medicine

## 2022-03-25 NOTE — Telephone Encounter (Signed)
error 

## 2022-03-25 NOTE — Telephone Encounter (Signed)
Noted  

## 2022-03-25 NOTE — Telephone Encounter (Signed)
Received paperwork to be filled out from [place] for Immunodeficiency Screening 03/19/22.  Called pt to set up appointment with Dr. Birdie Riddle to discuss. Pt states that she did not request this and to disregard the forms. Does not need an appointment at this time.

## 2022-03-31 ENCOUNTER — Encounter: Payer: Self-pay | Admitting: Podiatry

## 2022-03-31 ENCOUNTER — Ambulatory Visit (INDEPENDENT_AMBULATORY_CARE_PROVIDER_SITE_OTHER): Payer: Medicare Other | Admitting: Podiatry

## 2022-03-31 DIAGNOSIS — D3610 Benign neoplasm of peripheral nerves and autonomic nervous system, unspecified: Secondary | ICD-10-CM | POA: Diagnosis not present

## 2022-03-31 DIAGNOSIS — L600 Ingrowing nail: Secondary | ICD-10-CM | POA: Diagnosis not present

## 2022-03-31 NOTE — Progress Notes (Signed)
Subjective:   Patient ID: Debra Matthews, female   DOB: 66 y.o.   MRN: [id number]   HPI Patient presents stating I got 1 days relief and getting shooting pains left over right foot with the left being worse between the second and third toes and the one we did between the third and fourth is doing well.  States also that the toenail third right has become very tender thickened and elevated and it is hard for her to wear shoe gear and she would like it removed   ROS      Objective:  Physical Exam  Neurovascular status intact good digital perfusion noted exquisite discomfort with shooting pains and positive Mulder sign second interspace left no discomfort in the second MPJ left or third MPJ with the right showing moderate pain not to the same degree with a thickened deformed third nail right foot     Assessment:  Probability for neuroma symptomatology left second interspace and damaged third nailbed right      Plan:  H&P both conditions reviewed and for the left due to the intensity discomfort failure to respond to injection I recommended surgery and did explain no guarantee this will solve her problem.  She wants to have this done I allowed her to read consent form going over alternative treatments complications and after reviewing she signs.  She understands total recovery will take a number of months and that she may require further surgery in the future for the right I discussed this I recommended nail removal explained procedure risk patient signed consent form I infiltrated the right third toe 60 mg like Marcaine mixture sterile prep done using sterile instrumentation remove the nailbed exposed matrix applied phenol 3 applications 30 seconds followed by alcohol by sterile dressing gave instructions on soaks reappoint

## 2022-03-31 NOTE — Patient Instructions (Signed)

## 2022-04-01 ENCOUNTER — Telehealth: Payer: Self-pay

## 2022-04-01 NOTE — Telephone Encounter (Signed)
Received fax from [facility] requesting testing and other information. Placed in the folder in front office

## 2022-04-01 NOTE — Telephone Encounter (Signed)
Per Dr Birdie Riddle pt needs an apt . I am calling the pt to make her an apt

## 2022-04-01 NOTE — Telephone Encounter (Signed)
Spoke to the pt and she advised she did not give permission for this company to obtain anything from [place] or her . She states she does not need any genetic testing

## 2022-04-12 ENCOUNTER — Ambulatory Visit (INDEPENDENT_AMBULATORY_CARE_PROVIDER_SITE_OTHER): Payer: Medicare Other | Admitting: Podiatry

## 2022-04-12 DIAGNOSIS — L600 Ingrowing nail: Secondary | ICD-10-CM | POA: Diagnosis not present

## 2022-04-12 MED ORDER — MUPIROCIN 2 % EX OINT: 1.0000 | TOPICAL_OINTMENT | Freq: Two times a day (BID) | CUTANEOUS | 1 refills | Status: DC

## 2022-04-12 NOTE — Progress Notes (Signed)
Chief Complaint  Patient presents with   Toe Pain    Right foot pt stated that she had a nail removed about 2 weeks ago and her toe is red and sore she just wanted to have it looked at to make sure it wasn't infected     HPI: 66 y.o. female presenting today for follow-up evaluation following total permanent nail avulsion to the left third digit performed by Dr. Paulla Dolly on 03/31/2022.  Patient states that she has had redness and tenderness since the procedure.  She would like to have it evaluated today.  Past Medical History:  Diagnosis Date   Abdominal pain, epigastric    Anemia    hx of iron def.   Arthritis    Cancer ([facility])    non melanoma skin cancer  chest   Chronic constipation    in past   Cyst in hand    right wrist   Gastritis due to nonsteroidal anti-inflammatory drug (NSAID)    GERD (gastroesophageal reflux disease)    Headache    hx of    History of chicken pox    History of shingles    RLS (restless legs syndrome)    Subchondral insufficiency fracture of femoral condyle, right, initial encounter ([facility]) 07/15/2016    Past Surgical History:  Procedure Laterality Date   KNEE ARTHROSCOPY WITH MENISCAL REPAIR Right 05/18/2017   Procedure: KNEE ARTHROSCOPY WITH PARTIAL LATERAL MENISCECTOMY;  Surgeon: Paralee Cancel, MD;  Location: [facility];  Service: Orthopedics;  Laterality: Right;  Adductor Block   MEDIAL PARTIAL KNEE REPLACEMENT Right 05/18/2017   Procedure: RIGHT MEDIAL PARTIAL KNEE REPLACEMENT;  Surgeon: Paralee Cancel, MD;  Location: [facility];  Service: Orthopedics;  Laterality: Right;  Adductor Block   TUBAL LIGATION      Allergies  Allergen Reactions   Pseudoephedrine Other (See Comments)    Insomnia   Prednisone     Pt feels bad, headache, no appetite.     Physical Exam: General: The patient is alert and oriented x3 in no acute distress.  Dermatology: Absence of the nail plate noted to the left third digit.  It appears very stable.  There is some slight redness  around the distal tuft of the toe likely secondary to irritation and rubbing from close toed shoes and the application of phenol.  No purulence.  No drainage.  Vascular status intact  Musculoskeletal Exam: No pedal deformities noted   Assessment: 1.  Status post total permanent nail avulsion left third digit   Plan of Care:  1. Patient evaluated.  2.  Prescription for mupirocin 2% ointment to apply daily with a Band-Aid 3.  Patient has open toe surgical shoe at home.  Recommend wearing daily for the next 1-2 weeks to avoid irritation to the toe 4.  Patient has left foot surgery scheduled in December with Dr. Paulla Dolly.  She would like to discuss also surgery to the right fourth toe.  Recommend that she follows up with Dr. Paulla Dolly regarding this     Edrick Kins, DPM [facility]  Dr. Edrick Kins, DPM    [address], [place] [address]                Office [phone])  [phone]  Fax [phone]

## 2022-04-17 ENCOUNTER — Telehealth: Payer: Self-pay | Admitting: Lab

## 2022-04-17 NOTE — Telephone Encounter (Signed)
Received [employer] paperwork from the front and put in Dr. Rande Lawman folder to be reviewed

## 2022-04-18 ENCOUNTER — Telehealth: Payer: Self-pay | Admitting: Podiatry

## 2022-04-18 NOTE — Telephone Encounter (Signed)
DOS: 05/20/2022  Medicare and [place] Supplemental  Procedure: Neurectomy 2nd Lt (17616)  DX: G57.62  Prior Authorization is not required for Medicare or [employer].

## 2022-04-30 ENCOUNTER — Encounter: Payer: Self-pay | Admitting: Podiatry

## 2022-04-30 ENCOUNTER — Ambulatory Visit (INDEPENDENT_AMBULATORY_CARE_PROVIDER_SITE_OTHER): Payer: Medicare Other | Admitting: Podiatry

## 2022-04-30 DIAGNOSIS — D3610 Benign neoplasm of peripheral nerves and autonomic nervous system, unspecified: Secondary | ICD-10-CM

## 2022-04-30 NOTE — Progress Notes (Signed)
Subjective:   Patient ID: Debra Matthews, female   DOB: 66 y.o.   MRN: [id number]   HPI Patient presents for surgery left foot also states the right foot is really bothering her and she would like to get it corrected at the same time   ROS      Objective:  Physical Exam  Neuro vascular status intact with patient found to have a painful second interspace left foot with radiating discomfort into the adjacent digits and the third interspace right foot with radiating pain into the adjacent digits.  Patient is found to have good digital perfusion well-oriented x3     Assessment:  Neuroma symptomatology second interspace left third right with history of excision that did very well third interspace left foot     Plan:  H&P discussed condition recommended neurectomy second left third right and explained procedure risk patient wants surgery and after extensive review patient scheduled for outpatient surgery.  Patient understands all complications as outlined there is no guarantee this will solve all problems and that further procedure may be necessary.  Patient signed consent form scheduled for outpatient surgery with all questions answered today

## 2022-05-19 MED ORDER — HYDROCODONE-ACETAMINOPHEN 10-325 MG PO TABS: 1.0000 | ORAL_TABLET | Freq: Three times a day (TID) | ORAL | 0 refills | Status: DC | PRN

## 2022-05-19 NOTE — Addendum Note (Signed)
Addended by: Wallene Huh on: 05/19/2022 02:55 PM   Modules accepted: Orders

## 2022-05-20 ENCOUNTER — Other Ambulatory Visit: Payer: Self-pay | Admitting: Podiatry

## 2022-05-20 ENCOUNTER — Encounter: Payer: Self-pay | Admitting: Podiatry

## 2022-05-20 DIAGNOSIS — G5761 Lesion of plantar nerve, right lower limb: Secondary | ICD-10-CM | POA: Diagnosis not present

## 2022-05-20 DIAGNOSIS — G5762 Lesion of plantar nerve, left lower limb: Secondary | ICD-10-CM | POA: Diagnosis not present

## 2022-05-20 MED ORDER — HYDROCODONE-ACETAMINOPHEN 5-325 MG PO TABS: 2.0000 | ORAL_TABLET | Freq: Three times a day (TID) | ORAL | 0 refills | Status: AC | PRN

## 2022-05-20 MED ORDER — HYDROCODONE-ACETAMINOPHEN 5-325 MG PO TABS: 2.0000 | ORAL_TABLET | Freq: Three times a day (TID) | ORAL | 0 refills | Status: DC | PRN

## 2022-05-20 NOTE — Progress Notes (Signed)
Pharmacy out of '10mg'$  hydrocodone/APAP, rx sent for '5mg'$ /'325mg'$  and can take 1-2 tablets q8h. Patient's husband was notified by staff

## 2022-05-26 ENCOUNTER — Ambulatory Visit (INDEPENDENT_AMBULATORY_CARE_PROVIDER_SITE_OTHER): Payer: Medicare Other

## 2022-05-26 ENCOUNTER — Encounter: Payer: Medicare Other | Admitting: Podiatry

## 2022-05-26 DIAGNOSIS — D3610 Benign neoplasm of peripheral nerves and autonomic nervous system, unspecified: Secondary | ICD-10-CM | POA: Diagnosis not present

## 2022-05-26 NOTE — Progress Notes (Signed)
Patient presents today for post op visit # 1, patient of Dr. Paulla Dolly.    POV # 1 DOS 05/20/22 --- EXC SOFT TISSUE MASS (SUSPECT NEUROMA) 2ND LEFT FOOT & 3RD INTERSPACE RIGHT FOOT    She presents in her surgical shoes. Denies any falls or injury to the foot. Foot is swollen. No signs of infection. No calf pain or shortness of breath. Bandages dry and intact. Incision is intact. .     Both feet redressed today with a compression wrap and placed her back in her surgical shoes Reviewed icing and elevation. She will follow up with Dr. Paulla Dolly 2 weeks for POV# 2 and suture removal . No additional appointments will be needed after suture removal.

## 2022-06-04 ENCOUNTER — Encounter: Payer: Self-pay | Admitting: Family Medicine

## 2022-06-11 ENCOUNTER — Encounter: Payer: Self-pay | Admitting: Podiatry

## 2022-06-11 ENCOUNTER — Ambulatory Visit (INDEPENDENT_AMBULATORY_CARE_PROVIDER_SITE_OTHER): Payer: Medicare Other | Admitting: Podiatry

## 2022-06-11 DIAGNOSIS — D3610 Benign neoplasm of peripheral nerves and autonomic nervous system, unspecified: Secondary | ICD-10-CM

## 2022-06-11 NOTE — Progress Notes (Signed)
Subjective:   Patient ID: Debra Matthews, female   DOB: 66 y.o.   MRN: [id number]   HPI Patient states doing very with surgery very pleased   ROS      Objective:  Physical Exam  Neuro vascular status intact negative Bevelyn Buckles' sign noted wound edges bilateral healing well     Assessment:  Doing well post neurectomy bilateral     Plan:  Reviewed condition begin shoe gear usage and dispensed compression stockings bilateral.  Reappoint as symptoms indicate

## 2022-06-13 ENCOUNTER — Telehealth: Payer: Medicare Other | Admitting: Physician Assistant

## 2022-06-13 DIAGNOSIS — B9689 Other specified bacterial agents as the cause of diseases classified elsewhere: Secondary | ICD-10-CM

## 2022-06-13 DIAGNOSIS — J019 Acute sinusitis, unspecified: Secondary | ICD-10-CM

## 2022-06-13 MED ORDER — AMOXICILLIN-POT CLAVULANATE 875-125 MG PO TABS: 1.0000 | ORAL_TABLET | Freq: Two times a day (BID) | ORAL | 0 refills | Status: DC

## 2022-06-13 NOTE — Progress Notes (Signed)

## 2022-09-19 ENCOUNTER — Ambulatory Visit (INDEPENDENT_AMBULATORY_CARE_PROVIDER_SITE_OTHER): Payer: Medicare Other | Admitting: Family Medicine

## 2022-09-19 ENCOUNTER — Encounter: Payer: Self-pay | Admitting: Family Medicine

## 2022-09-19 VITALS — BP 126/72 | HR 87 | Temp 98.0°F | Resp 18 | Ht 62.0 in | Wt 139.0 lb

## 2022-09-19 DIAGNOSIS — S22049D Unspecified fracture of fourth thoracic vertebra, subsequent encounter for fracture with routine healing: Secondary | ICD-10-CM | POA: Diagnosis not present

## 2022-09-19 DIAGNOSIS — R55 Syncope and collapse: Secondary | ICD-10-CM

## 2022-09-19 DIAGNOSIS — R011 Cardiac murmur, unspecified: Secondary | ICD-10-CM

## 2022-09-19 DIAGNOSIS — S020XXD Fracture of vault of skull, subsequent encounter for fracture with routine healing: Secondary | ICD-10-CM | POA: Diagnosis not present

## 2022-09-19 DIAGNOSIS — W19XXXD Unspecified fall, subsequent encounter: Secondary | ICD-10-CM

## 2022-09-19 DIAGNOSIS — S0292XD Unspecified fracture of facial bones, subsequent encounter for fracture with routine healing: Secondary | ICD-10-CM

## 2022-09-19 DIAGNOSIS — I609 Nontraumatic subarachnoid hemorrhage, unspecified: Secondary | ICD-10-CM

## 2022-09-19 LAB — CBC WITH DIFFERENTIAL/PLATELET
Basophils Absolute: 0.1 10*3/uL (ref 0.0–0.1)
Basophils Relative: 1.2 % (ref 0.0–3.0)
Eosinophils Absolute: 0 10*3/uL (ref 0.0–0.7)
Eosinophils Relative: 0.7 % (ref 0.0–5.0)
HCT: 42.1 % (ref 36.0–46.0)
Hemoglobin: 14.4 g/dL (ref 12.0–15.0)
Lymphocytes Relative: 26.8 % (ref 12.0–46.0)
Lymphs Abs: 1.8 10*3/uL (ref 0.7–4.0)
MCHC: 34.3 g/dL (ref 30.0–36.0)
MCV: 92.8 fl (ref 78.0–100.0)
Monocytes Absolute: 0.6 10*3/uL (ref 0.1–1.0)
Monocytes Relative: 8.4 % (ref 3.0–12.0)
Neutro Abs: 4.2 10*3/uL (ref 1.4–7.7)
Neutrophils Relative %: 62.9 % (ref 43.0–77.0)
Platelets: 231 10*3/uL (ref 150.0–400.0)
RBC: 4.54 Mil/uL (ref 3.87–5.11)
RDW: 13.2 % (ref 11.5–15.5)
WBC: 6.7 10*3/uL (ref 4.0–10.5)

## 2022-09-19 LAB — HEPATIC FUNCTION PANEL
ALT: 17 U/L (ref 0–35)
AST: 22 U/L (ref 0–37)
Albumin: 4.7 g/dL (ref 3.5–5.2)
Alkaline Phosphatase: 72 U/L (ref 39–117)
Bilirubin, Direct: 0.1 mg/dL (ref 0.0–0.3)
Total Bilirubin: 0.6 mg/dL (ref 0.2–1.2)
Total Protein: 7.4 g/dL (ref 6.0–8.3)

## 2022-09-19 LAB — BASIC METABOLIC PANEL
BUN: 10 mg/dL (ref 6–23)
CO2: 26 mEq/L (ref 19–32)
Calcium: 9.7 mg/dL (ref 8.4–10.5)
Chloride: 105 mEq/L (ref 96–112)
Creatinine, Ser: 0.68 mg/dL (ref 0.40–1.20)
GFR: 89.79 mL/min (ref >60.00)
Glucose, Bld: 79 mg/dL (ref 70–99)
Potassium: 4.3 mEq/L (ref 3.5–5.1)
Sodium: 140 mEq/L (ref 135–145)

## 2022-09-19 LAB — MAGNESIUM: Magnesium: 1.9 mg/dL (ref 1.5–2.5)

## 2022-09-19 NOTE — Patient Instructions (Addendum)
Follow up in 3 months to recheck healing We'll notify you of your lab results and make any changes if needed No med changes at this time We'll call you to schedule your cardiology appt Make sure you are drinking plenty of water Change positions slowly!! If you change your mind on speech or neuro physical therapy- let me know Call with any questions or concerns Hang in there!!!  Be patient!!!

## 2022-09-19 NOTE — Progress Notes (Unsigned)
   Subjective:    Patient ID: Debra Matthews, female    DOB: 1956/05/23, 67 y.o.   MRN: [id number]  HPI Hospital f/u- pt was not feeling well on 2/23 when she sustained a fall in the lobby of a restaurant.  Husband reported LOC for ~10 minutes but fall was not witnessed.  She was transported by Ambulance to [facility]  GCS 15 on arrival.  She was found to have R parietal skull fracture, small SAH along L temporal lobe, R maxillary sinus fx, R zygomatic arch fx, T3/T4 fxs.  Pt was d/c'd home on soft mechanical diet, Hydrocodone for pain control, Keppra for 6 days for seizure prophylaxis, Meclizine as needed.  Pt was to follow up w/ Trauma, Ortho, ENT, Neurosurg.  Pt remains on soft diet due to facial fxs.  No surgery was required to address fractures.  Pt continues to have dizziness and headaches.  They are starting to improve.  Has been using a walker for stability until this week when she started using a cane.    Pt did have some electrolyte abnormalities in ER.  Review of Systems For ROS see HPI     Objective:   Physical Exam        Assessment & Plan:

## 2022-09-21 DIAGNOSIS — R011 Cardiac murmur, unspecified: Secondary | ICD-10-CM | POA: Insufficient documentation

## 2022-09-21 NOTE — Assessment & Plan Note (Signed)
New.  Reviewed EKG from hospitalization- WNL.  Pt denies palpitations or CP but did have an unexplained syncopal episode on 2/23 in which she sustained serious injuries.  Unsure if this was a cardiac etiology or neurogenic but given a new murmur, will refer to cardiology for complete evaluation.  Pt expressed understanding and is in agreement w/ plan.

## 2022-09-22 ENCOUNTER — Telehealth: Payer: Self-pay

## 2022-09-22 NOTE — Telephone Encounter (Signed)
-----   Message from Sheliah Hatch, MD sent at 09/19/2022  4:51 PM EDT ----- Labs look great!  No changes at this time

## 2022-09-22 NOTE — Telephone Encounter (Signed)
Pt seen results Via my chart  

## 2022-10-30 DIAGNOSIS — S0282XA Fracture of other specified skull and facial bones, left side, initial encounter for closed fracture: Secondary | ICD-10-CM | POA: Insufficient documentation

## 2022-10-30 DIAGNOSIS — S069XAA Unspecified intracranial injury with loss of consciousness status unknown, initial encounter: Secondary | ICD-10-CM | POA: Insufficient documentation

## 2022-10-30 DIAGNOSIS — F0781 Postconcussional syndrome: Secondary | ICD-10-CM | POA: Insufficient documentation

## 2022-11-05 ENCOUNTER — Encounter: Payer: Self-pay | Admitting: Internal Medicine

## 2022-11-05 ENCOUNTER — Other Ambulatory Visit (HOSPITAL_COMMUNITY)
Admission: RE | Admit: 2022-11-05 | Discharge: 2022-11-05 | Disposition: A | Discharge status: Home / Self Care | Payer: Medicare Other | Source: Ambulatory Visit | Attending: Internal Medicine | Admitting: Internal Medicine

## 2022-11-05 ENCOUNTER — Ambulatory Visit (INDEPENDENT_AMBULATORY_CARE_PROVIDER_SITE_OTHER): Payer: Medicare Other | Admitting: Internal Medicine

## 2022-11-05 VITALS — BP 118/68 | HR 73 | Ht 61.5 in | Wt 139.0 lb

## 2022-11-05 DIAGNOSIS — R5383 Other fatigue: Secondary | ICD-10-CM | POA: Insufficient documentation

## 2022-11-05 DIAGNOSIS — E785 Hyperlipidemia, unspecified: Secondary | ICD-10-CM | POA: Insufficient documentation

## 2022-11-05 DIAGNOSIS — K219 Gastro-esophageal reflux disease without esophagitis: Secondary | ICD-10-CM

## 2022-11-05 DIAGNOSIS — R55 Syncope and collapse: Secondary | ICD-10-CM | POA: Insufficient documentation

## 2022-11-05 LAB — CBC
HCT: 41.5 % (ref 36.0–46.0)
Hemoglobin: 13.8 g/dL (ref 12.0–15.0)
MCH: 31.3 pg (ref 26.0–34.0)
MCHC: 33.3 g/dL (ref 30.0–36.0)
MCV: 94.1 fL (ref 80.0–100.0)
Platelets: 184 10*3/uL (ref 150–400)
RBC: 4.41 MIL/uL (ref 3.87–5.11)
RDW: 12.1 % (ref 11.5–15.5)
WBC: 4.3 10*3/uL (ref 4.0–10.5)
nRBC: 0 % (ref 0.0–0.2)

## 2022-11-05 LAB — TSH: TSH: 1.309 u[IU]/mL (ref 0.350–4.500)

## 2022-11-05 NOTE — Patient Instructions (Addendum)
Medication Instructions:  Your physician recommends that you continue on your current medications as directed. Please refer to the Current Medication list given to you today.   Labwork: CBC,TSH, NMR  Testing/Procedures: Your physician has requested that you have an echocardiogram. Echocardiography is a painless test that uses sound waves to create images of your heart. It provides your doctor with information about the size and shape of your heart and how well your heart's chambers and valves are working. This procedure takes approximately one hour. There are no restrictions for this procedure. Please do NOT wear cologne, perfume, aftershave, or lotions (deodorant is allowed). Please arrive 15 minutes prior to your appointment time.   Follow-Up: To be determined after echocardiogram  Any Other Special Instructions Will Be Listed Below (If Applicable).  If you need a refill on your cardiac medications before your next appointment, please call your pharmacy.

## 2022-11-05 NOTE — Progress Notes (Unsigned)
Cardiology Office Note   Date:  11/05/2022   ID:  Tischa, Coopman [birth year], MRN [id number]  PCP:  Sheliah Hatch, MD  Cardiologist:   Dietrich Pates, MD   Pt referred by Dr Beverely Low for syncope   History of Present Illness: Debra Matthews is a 67 y.o. female with a history of syncope  On 08/08/22 she had syncopal spell in loby of restaurant   Unwitnessed event   Went to [facility]   R parietal skull fx , small L temporal lobe SAH, Fx on R side of face   Also  Fall with SAH and facial fractures.    Patient says that on 08/08/22 she did not feel good   Weak   Didn't feel right.  Wanted to lay down all day   Went out to eat  wasn't hungry  Took one bite  Got nauseated   Micah Flesher out to go to car   [employer] about 20 feet   Doesn't really remember   Blacked out in foyer    LOC for 10 min    She does not remember anything   Pt says she does get tired easy    Does have ahx of heartburn with food  Has acid reflux  Burning in upper chest all the time SOmetimes  has pain in chest that is severe   Not associated with [id number]   Inconsistent  Once every few weeks   Episodes last 10 to 15 min  chest Erratic  Not concistent Happen  Pain severe in lower chest     Not assoicated with activty  Does cough up bits of food   12 years ago feeding horse   GOt really nauseated  Pain in lower chest and severe nausea    Had to lay down on ground   Current Meds  Medication Sig   traZODone (DESYREL) 50 MG tablet Take 2 tablets (100 mg total) by mouth at bedtime.     Allergies:   Pseudoephedrine and Prednisone   Past Medical History:  Diagnosis Date   Abdominal pain, epigastric    Anemia    hx of iron def.   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)    GERD (gastroesophageal reflux disease)    Headache    hx of    History of chicken pox    History of shingles     RLS (restless legs syndrome)    Subchondral insufficiency fracture of femoral condyle, right, initial encounter ([facility]) 07/15/2016    Past Surgical History:  Procedure Laterality Date   KNEE ARTHROSCOPY WITH MENISCAL REPAIR Right 05/18/2017   Procedure: KNEE ARTHROSCOPY WITH PARTIAL LATERAL MENISCECTOMY;  Surgeon: Durene Romans, MD;  Location: [facility];  Service: Orthopedics;  Laterality: Right;  Adductor Block   MEDIAL PARTIAL KNEE REPLACEMENT Right 05/18/2017   Procedure: RIGHT MEDIAL PARTIAL KNEE REPLACEMENT;  Surgeon: Durene Romans, MD;  Location: [facility];  Service: Orthopedics;  Laterality: Right;  Adductor Block   TUBAL LIGATION       Social History:  The patient  reports that she has never smoked. She has never used smokeless tobacco. She reports that she does not drink alcohol and does not use drugs.   Family History:  The patient's family history includes Colon cancer  in her maternal aunt; Mitral valve prolapse in her mother; Scoliosis in her mother.    ROS:  Please see the history of present illness. All other systems are reviewed and  Negative to the above problem except as noted.    PHYSICAL EXAM: VS:  BP 118/68   Pulse 73   Ht 5' 1.5" (1.562 m)   Wt 139 lb (63 kg)   SpO2 98%   BMI 25.84 kg/m   GEN: Well nourished, well developed, in no acute distress  HEENT: normal  Neck: no JVD, carotid bruits Cardiac: RRR; Gr II/VI systolic murmur LLSB  No LE edema  Respiratory:  clear to auscultation bilaterally GI: soft, nontender, nondistended, + BS  No hepatomegaly  MS: no deformity Moving all extremities   Skin: warm and dry, no rash Neuro:  Strength and sensation are intact Psych: euthymic mood, full affect   EKG:  EKG is ordered today.  NSR 73 bpm     Lipid Panel    Component Value Date/Time   CHOL 277 (H) 01/14/2022 1056   TRIG 74.0 01/14/2022 1056   HDL 76.20 01/14/2022 1056   CHOLHDL 4 01/14/2022 1056   VLDL 14.8 01/14/2022 1056   LDLCALC 186 (H) 01/14/2022  1056      Wt Readings from Last 3 Encounters:  11/05/22 139 lb (63 kg)  09/19/22 139 lb (63 kg)  02/26/22 136 lb (61.7 kg)      ASSESSMENT AND PLAN:  1  Syncope      2   Fatigue   Echo  Check TSh  3  HL  Prfound variation in LDL over the years   She was placed on rosuvastatin but stopped it   Will chek lipomed today     Current medicines are reviewed at length with the patient today.  The patient does not have concerns regarding medicines.  Signed, Dietrich Pates, MD  11/05/2022 9:02 AM    [facility] [address] [place], [place], [place]  [address] Phone: [phone]; Fax: [phone]

## 2022-11-06 LAB — NMR, LIPOPROFILE
Cholesterol, Total: 250 mg/dL — ABNORMAL HIGH (ref 100–199)
HDL Cholesterol by NMR: 68 mg/dL (ref >39)
HDL Particle Number: 42.7 umol/L (ref >=30.5)
LDL Particle Number: 1629 nmol/L — ABNORMAL HIGH (ref <1000)
LDL Size: 21.3 nm (ref >20.5)
LDL-C (NIH Calc): 147 mg/dL — ABNORMAL HIGH (ref 0–99)
LP-IR Score: 31 (ref <=45)
Small LDL Particle Number: 731 nmol/L — ABNORMAL HIGH (ref <=527)
Triglycerides by NMR: 198 mg/dL — ABNORMAL HIGH (ref 0–149)

## 2022-11-14 ENCOUNTER — Telehealth: Payer: Self-pay

## 2022-11-14 DIAGNOSIS — E785 Hyperlipidemia, unspecified: Secondary | ICD-10-CM

## 2022-11-14 DIAGNOSIS — R55 Syncope and collapse: Secondary | ICD-10-CM

## 2022-11-14 NOTE — Telephone Encounter (Signed)
Attempted to contact patient with no answer/no voicemail. Patient notified via MyChart.

## 2022-11-14 NOTE — Telephone Encounter (Signed)
-----   Message from Bertram Millard, RN sent at 11/14/2022  4:25 PM EDT -----  ----- Message ----- From: Dossie Arbour, RN Sent: 11/14/2022   3:36 PM EDT To: Bertram Millard, RN

## 2022-12-29 ENCOUNTER — Ambulatory Visit (HOSPITAL_COMMUNITY)
Admission: RE | Admit: 2022-12-29 | Discharge: 2022-12-29 | Disposition: A | Discharge status: Home / Self Care | Payer: Medicare Other | Source: Ambulatory Visit | Attending: Internal Medicine | Admitting: Internal Medicine

## 2022-12-29 DIAGNOSIS — R55 Syncope and collapse: Secondary | ICD-10-CM | POA: Insufficient documentation

## 2022-12-29 DIAGNOSIS — E785 Hyperlipidemia, unspecified: Secondary | ICD-10-CM | POA: Insufficient documentation

## 2022-12-29 LAB — ECHOCARDIOGRAM COMPLETE
Area-P 1/2: 3.63 cm2
MV M vel: 4.88 m/s
MV Peak grad: 95.1 mmHg
S' Lateral: 2.7 cm

## 2022-12-29 NOTE — Progress Notes (Signed)
  Echocardiogram 2D Echocardiogram has been performed.  Debra Matthews 12/29/2022, 1:49 PM

## 2022-12-30 ENCOUNTER — Ambulatory Visit (INDEPENDENT_AMBULATORY_CARE_PROVIDER_SITE_OTHER): Payer: Medicare Other | Admitting: Family Medicine

## 2022-12-30 ENCOUNTER — Encounter: Payer: Self-pay | Admitting: Family Medicine

## 2022-12-30 VITALS — BP 122/72 | HR 75 | Temp 98.4°F | Ht 61.5 in | Wt 138.8 lb

## 2022-12-30 DIAGNOSIS — K219 Gastro-esophageal reflux disease without esophagitis: Secondary | ICD-10-CM

## 2022-12-30 DIAGNOSIS — R5383 Other fatigue: Secondary | ICD-10-CM

## 2022-12-30 DIAGNOSIS — F0781 Postconcussional syndrome: Secondary | ICD-10-CM

## 2022-12-30 DIAGNOSIS — D509 Iron deficiency anemia, unspecified: Secondary | ICD-10-CM | POA: Diagnosis not present

## 2022-12-30 DIAGNOSIS — M810 Age-related osteoporosis without current pathological fracture: Secondary | ICD-10-CM

## 2022-12-30 LAB — CBC WITH DIFFERENTIAL/PLATELET
Basophils Absolute: 0.1 10*3/uL (ref 0.0–0.1)
Basophils Relative: 1.2 % (ref 0.0–3.0)
Eosinophils Absolute: 0.1 10*3/uL (ref 0.0–0.7)
Eosinophils Relative: 2.1 % (ref 0.0–5.0)
HCT: 41.8 % (ref 36.0–46.0)
Hemoglobin: 14 g/dL (ref 12.0–15.0)
Lymphocytes Relative: 31.3 % (ref 12.0–46.0)
Lymphs Abs: 1.5 10*3/uL (ref 0.7–4.0)
MCHC: 33.5 g/dL (ref 30.0–36.0)
MCV: 92.8 fl (ref 78.0–100.0)
Monocytes Absolute: 0.3 10*3/uL (ref 0.1–1.0)
Monocytes Relative: 7.3 % (ref 3.0–12.0)
Neutro Abs: 2.7 10*3/uL (ref 1.4–7.7)
Neutrophils Relative %: 58.1 % (ref 43.0–77.0)
Platelets: 219 10*3/uL (ref 150.0–400.0)
RBC: 4.51 Mil/uL (ref 3.87–5.11)
RDW: 12.5 % (ref 11.5–15.5)
WBC: 4.7 10*3/uL (ref 4.0–10.5)

## 2022-12-30 LAB — BASIC METABOLIC PANEL
BUN: 15 mg/dL (ref 6–23)
CO2: 26 mEq/L (ref 19–32)
Calcium: 10 mg/dL (ref 8.4–10.5)
Chloride: 105 mEq/L (ref 96–112)
Creatinine, Ser: 0.66 mg/dL (ref 0.40–1.20)
GFR: 90.26 mL/min (ref >60.00)
Glucose, Bld: 97 mg/dL (ref 70–99)
Potassium: 4.6 mEq/L (ref 3.5–5.1)
Sodium: 139 mEq/L (ref 135–145)

## 2022-12-30 MED ORDER — TRAZODONE HCL 50 MG PO TABS: 100.0000 mg | ORAL_TABLET | Freq: Every day | ORAL | 1 refills | Status: DC

## 2022-12-30 MED ORDER — MECLIZINE HCL 25 MG PO TABS: 25.0000 mg | ORAL_TABLET | Freq: Three times a day (TID) | ORAL | 3 refills | Status: DC | PRN

## 2022-12-30 MED ORDER — BUTALBITAL-APAP-CAFFEINE 50-325-40 MG PO TABS: 1.0000 | ORAL_TABLET | Freq: Four times a day (QID) | ORAL | 0 refills | Status: DC | PRN

## 2022-12-30 MED ORDER — PANTOPRAZOLE SODIUM 40 MG PO TBEC: 40.0000 mg | DELAYED_RELEASE_TABLET | Freq: Every day | ORAL | 3 refills | Status: DC

## 2022-12-30 MED ORDER — PRAMIPEXOLE DIHYDROCHLORIDE 1 MG PO TABS: 1.5000 mg | ORAL_TABLET | Freq: Every day | ORAL | 3 refills | Status: DC

## 2022-12-30 NOTE — Assessment & Plan Note (Signed)
Pt has hx of this and may be contributing to her fatigue.

## 2022-12-30 NOTE — Progress Notes (Signed)
   Subjective:    Patient ID: Debra Matthews, female    DOB: May 22, 1956, 67 y.o.   MRN: [id number]  HPI S/p syncope- pt had syncopal episode 08/08/22 and sustained serious injuries including a subarachnoid hematoma.  [facility] prescribed fioricet to take as needed for ongoing HA's- pt needs refill.  Also taking Meclizine as needed.  Had 3 episodes of severe dizziness since her fall.  Continues to have word finding issues.  Pt is not interested in doing neuro rehab.  Pt has abnormal taste and smell- ENT feels she is dealing w/ post concussion syndrome.  Pt reports excessive fatigue.  She is aware that a TBI can cause fatigue but wonders if there is any underlying metabolic cause  Severe GERD- pt is taking OTC meds w/o improvement.  Cards referred to GI but appt isn't until mid September.  Pt reports increased regurgitation.     Review of Systems For ROS see HPI     Objective:   Physical Exam Vitals reviewed.  Constitutional:      General: She is not in acute distress.    Appearance: Normal appearance. She is not ill-appearing.  HENT:     Head: Normocephalic and atraumatic.  Eyes:     Extraocular Movements: Extraocular movements intact.     Conjunctiva/sclera: Conjunctivae normal.  Cardiovascular:     Rate and Rhythm: Normal rate and regular rhythm.  Pulmonary:     Effort: Pulmonary effort is normal. No respiratory distress.  Skin:    General: Skin is warm and dry.  Neurological:     General: No focal deficit present.     Mental Status: She is alert and oriented to person, place, and time.  Psychiatric:        Mood and Affect: Mood normal.        Behavior: Behavior normal.     Comments: Intermittent word finding difficulty           Assessment & Plan:  Fatigue- new.  Pt reports that she is struggling w/ excessive fatigue.  She denies depressive sxs (even though her PHQ is +).  She is aware that TBI can cause fatigue but she is wondering if her iron deficiency or other underlying  metabolic issue may be the cause.  Will check labs to assess.  Discussed neuro rehab but she is not interested at this time.  Post-concussion syndrome- pt has had f/u w/ ENT and [facility] trauma service.  At this time, she has rare episodes of dizziness and some persistent word finding issues.  She is not interested in neuro rehab at this time.  Did provide Meclizine to use as needed for dizziness and Fioricet to use as needed for HA's (which is what the trauma team recommended).

## 2022-12-30 NOTE — Assessment & Plan Note (Signed)
Deteriorated.  Pt reports sxs are severe despite taking OTC PPI.  Cards placed a GI referral but she can't see them until mid-September.  Will start prescription strength Protonix and monitor for improvement.  Pt expressed understanding and is in agreement w/ plan.

## 2022-12-30 NOTE — Assessment & Plan Note (Signed)
Check Vit D and replete prn.  May be contributing to her fatigue

## 2022-12-30 NOTE — Patient Instructions (Addendum)
Follow up as needed or as scheduled We'll notify you of your lab results and make any changes if needed START the Pantoprazole daily for reflux Use the Fioricet as needed for headaches Take Meclizine as needed Continue to drink LOTS of water Call with any questions or concerns Hang in there!!  You're doing great!!!

## 2022-12-31 ENCOUNTER — Encounter: Payer: Self-pay | Admitting: Internal Medicine

## 2022-12-31 LAB — B12 AND FOLATE PANEL
Folate: >23.8 ng/mL (ref >5.9)
Vitamin B-12: 328 pg/mL (ref 211–911)

## 2022-12-31 LAB — TSH: TSH: 4.66 u[IU]/mL (ref 0.35–5.50)

## 2022-12-31 LAB — IRON,TIBC AND FERRITIN PANEL
%SAT: 26 % (calc) (ref 16–45)
Ferritin: 37 ng/mL (ref 16–288)
Iron: 93 ug/dL (ref 45–160)
TIBC: 358 mcg/dL (calc) (ref 250–450)

## 2022-12-31 LAB — VITAMIN D 25 HYDROXY (VIT D DEFICIENCY, FRACTURES): VITD: 34.82 ng/mL (ref 30.00–100.00)

## 2023-01-01 ENCOUNTER — Telehealth: Payer: Self-pay | Admitting: Family Medicine

## 2023-01-01 ENCOUNTER — Encounter: Payer: Self-pay | Admitting: Internal Medicine

## 2023-01-01 ENCOUNTER — Encounter: Payer: Self-pay | Admitting: Family Medicine

## 2023-01-01 ENCOUNTER — Telehealth: Payer: Self-pay

## 2023-01-01 DIAGNOSIS — Z79899 Other long term (current) drug therapy: Secondary | ICD-10-CM

## 2023-01-01 DIAGNOSIS — E785 Hyperlipidemia, unspecified: Secondary | ICD-10-CM

## 2023-01-01 MED ORDER — ATORVASTATIN CALCIUM 10 MG PO TABS: 10.0000 mg | ORAL_TABLET | Freq: Every day | ORAL | 3 refills | Status: DC

## 2023-01-01 NOTE — Telephone Encounter (Signed)
Carotid USN showed trace plaquing    With LDL levels would recomm trying lipitor 10 mg  every other day   If tolerates then increase to every day Check lipomed and liver panel in 12 wks    Limited processed carbs, sugary foods

## 2023-01-01 NOTE — Telephone Encounter (Signed)
Pt reviewed on mychart

## 2023-01-01 NOTE — Telephone Encounter (Signed)
-----   Message from Neena Rhymes sent at 01/01/2023  7:36 AM EDT ----- Labs look great!  No obvious cause of fatigue noted

## 2023-01-05 NOTE — Addendum Note (Signed)
Addended by: Bertram Millard on: 01/05/2023 03:09 PM   Modules accepted: Orders

## 2023-01-06 NOTE — Addendum Note (Signed)
Addended by: Bertram Millard on: 01/06/2023 09:26 AM   Modules accepted: Orders

## 2023-01-14 ENCOUNTER — Ambulatory Visit (HOSPITAL_BASED_OUTPATIENT_CLINIC_OR_DEPARTMENT_OTHER)
Admission: RE | Admit: 2023-01-14 | Discharge: 2023-01-14 | Disposition: A | Discharge status: Home / Self Care | Payer: Medicare Other | Source: Ambulatory Visit | Attending: Family Medicine | Admitting: Family Medicine

## 2023-01-14 ENCOUNTER — Encounter: Payer: Self-pay | Admitting: Family Medicine

## 2023-01-14 ENCOUNTER — Telehealth: Payer: Self-pay | Admitting: Family Medicine

## 2023-01-14 ENCOUNTER — Ambulatory Visit (INDEPENDENT_AMBULATORY_CARE_PROVIDER_SITE_OTHER): Payer: Medicare Other | Admitting: Family Medicine

## 2023-01-14 VITALS — BP 112/68 | HR 75 | Temp 97.8°F | Ht 61.5 in | Wt 138.5 lb

## 2023-01-14 DIAGNOSIS — R1030 Lower abdominal pain, unspecified: Secondary | ICD-10-CM

## 2023-01-14 DIAGNOSIS — R110 Nausea: Secondary | ICD-10-CM | POA: Insufficient documentation

## 2023-01-14 LAB — POCT URINALYSIS DIPSTICK
Bilirubin, UA: NEGATIVE
Blood, UA: NEGATIVE
Glucose, UA: NEGATIVE
Ketones, UA: NEGATIVE
Leukocytes, UA: NEGATIVE
Nitrite, UA: NEGATIVE
Protein, UA: POSITIVE — AB
Spec Grav, UA: 1.025 (ref 1.010–1.025)
Urobilinogen, UA: 0.2 E.U./dL — AB
pH, UA: 5.5 (ref 5.0–8.0)

## 2023-01-14 MED ORDER — IOHEXOL 300 MG/ML  SOLN
100.0000 mL | Freq: Once | INTRAMUSCULAR | Status: AC | PRN
  Administered 2023-01-14: 80 mL via INTRAVENOUS

## 2023-01-14 NOTE — Telephone Encounter (Signed)
Caller name: ALDIA MOPPIN  On DPR?: Yes  Call back number: [phone] (mobile)  Provider they see: Sheliah Hatch, MD  Reason for call:  Pt would a call from [facility] didn't say about what

## 2023-01-14 NOTE — Progress Notes (Signed)
Acute Office Visit   Subjective:  Patient ID: Debra Matthews, female    DOB: April 20, 1956, 67 y.o.   MRN: [id number]  Chief Complaint  Patient presents with   lower abdominal    Pt reports she has had abdominal pain 4 days. Describes as very tender-needle like stabbing Pt reports 3 am this morning she had so much pain she laid on the floor for "long" time and took a bath. Pt reports 2 bowel movement today. Sx today-weak, shaking, Denies frequent urination,pain with urination or odor      HPI Patient is a 67 year old female that is complaining of lower abd pain. Pain started 4 days ago, all of a sudden. Described as very tender, needle like stabbing pain. Intermittent, usually last a few seconds. Prior to the change in pain this morning, she denies nausea, vomiting, diarrhea, or constipation. Reports she has had 2 bowel movements today. Reports decreased in appetite.   Then, last night around 3 am her abd pain changed to sharp, stabbing pains that lasted about 1.5 hours. She reports the pain radiated downward. She became nausea with no vomiting and weakness. She reports she was unable to move hardly, and laid on her floor.   Denies fever, increase in urgency/frequency, hematuria, or vaginal discharge.   Currently, having the needle like stabbing pain. Denies the severe sharp pain that she experienced last night.   ROS See HPI above      Objective:   BP 112/68   Pulse 75   Temp 97.8 F (36.6 C)   Ht 5' 1.5" (1.562 m)   Wt 138 lb 8 oz (62.8 kg)   SpO2 98%   BMI 25.75 kg/m    Physical Exam Vitals reviewed.  Constitutional:      General: She is not in acute distress.    Appearance: Normal appearance. She is not ill-appearing, toxic-appearing or diaphoretic.  HENT:     Head: Normocephalic and atraumatic.  Eyes:     General:        Right eye: No discharge.        Left eye: No discharge.     Conjunctiva/sclera: Conjunctivae normal.  Cardiovascular:     Rate and Rhythm:  Normal rate and regular rhythm.     Heart sounds: Normal heart sounds. No murmur heard.    No friction rub. No gallop.  Pulmonary:     Effort: Pulmonary effort is normal. No respiratory distress.     Breath sounds: Normal breath sounds.  Abdominal:     General: Abdomen is flat. Bowel sounds are normal. There is no distension.     Tenderness: There is abdominal tenderness (Severe tenderness right lower, mid lower, and left lower.). There is no right CVA tenderness or left CVA tenderness.  Musculoskeletal:        General: Normal range of motion.  Skin:    General: Skin is warm and dry.  Neurological:     General: No focal deficit present.     Mental Status: She is alert and oriented to person, place, and time. Mental status is at baseline.  Psychiatric:        Mood and Affect: Mood normal.        Behavior: Behavior normal.        Thought Content: Thought content normal.        Judgment: Judgment normal.    Results for orders placed or performed in visit on 01/14/23  POCT Urinalysis Dipstick  Result Value  Ref Range   Color, UA     Clarity, UA     Glucose, UA Negative Negative   Bilirubin, UA NEG    Ketones, UA NEG    Spec Grav, UA 1.025 1.010 - 1.025   Blood, UA NEG    pH, UA 5.5 5.0 - 8.0   Protein, UA Positive (A) Negative   Urobilinogen, UA 0.2 (A) 0.2 or 1.0 E.U./dL   Nitrite, UA NEG    Leukocytes, UA Negative Negative   Appearance     Odor        Assessment & Plan:  Lower abdominal pain -     POCT urinalysis dipstick -     CT ABDOMEN PELVIS W CONTRAST; Future  Nausea -     CT ABDOMEN PELVIS W CONTRAST; Future  -Urinalysis completed with positive for protein and abnormal urobilinogen. No signs of infection.  - Ordered STAT CT abd pelvis with contrast for abd pain with severe tenderness and nausea. Referral team is going to contact patient about time and place to be for scan. Informed patient if she had not heard about this information in 30 minutes, please call  back to the office.  -Offered to prescribed Zofran, patient declined. She felt like she didn't need it. Advised to call back to the office or send a MyChart message if she changed her mind.    Zandra Abts, NP

## 2023-01-14 NOTE — Telephone Encounter (Signed)
Pt states she has been having pain in her lower abdomen .   Pain is severe in abdominal area above pelvic area  Denies any pain with urination  Feels bruised  Slept on heating pad has been having some BM 's regular  No bleeding  I advised pt that she needed an apt to be seen  She refused to go to ER

## 2023-01-14 NOTE — Telephone Encounter (Signed)
Pt is seeing Mardene Celeste this afternoon

## 2023-01-14 NOTE — Patient Instructions (Signed)
-  Urinalysis was negative for infection.  -Ordered STAT CT abd pelvis with contrast for pain with tenderness. Referral team will be calling you with a time and location for your scan. If you do not hear back about this in the next 30 minutes please call our office. -If you change your mind about Zofran, please call the office or send a MyChart message.

## 2023-01-14 NOTE — Telephone Encounter (Signed)
Needs to be seen ASAP with any available provider.  If pain is severe or worsening, she should go to the ER for complete evaluation

## 2023-01-15 ENCOUNTER — Encounter: Payer: Self-pay | Admitting: Family Medicine

## 2023-01-16 ENCOUNTER — Other Ambulatory Visit: Payer: Self-pay

## 2023-01-16 DIAGNOSIS — D490 Neoplasm of unspecified behavior of digestive system: Secondary | ICD-10-CM

## 2023-01-16 DIAGNOSIS — R9389 Abnormal findings on diagnostic imaging of other specified body structures: Secondary | ICD-10-CM

## 2023-01-16 MED ORDER — AMOXICILLIN-POT CLAVULANATE 875-125 MG PO TABS: 1.0000 | ORAL_TABLET | Freq: Two times a day (BID) | ORAL | 0 refills | Status: DC

## 2023-01-19 ENCOUNTER — Other Ambulatory Visit: Payer: Self-pay

## 2023-01-19 NOTE — Addendum Note (Signed)
Addended byRockney Ghee on: 01/19/2023 03:55 PM   Modules accepted: Orders

## 2023-01-19 NOTE — Addendum Note (Signed)
Addended byRockney Ghee on: 01/19/2023 03:58 PM   Modules accepted: Orders

## 2023-01-27 ENCOUNTER — Telehealth: Payer: Self-pay

## 2023-01-27 NOTE — Telephone Encounter (Signed)
Gunnar Fusi and Irving Burton,  I was contacted by office of patient experience to see if we could get this patient in sooner.  Patient was referred for severe GERD and dysphagia, but recently had a CT scan for abdominal pain that showed : 1. Fat stranding in the left lower quadrant is adjacent to a nondistended loop of sigmoid colon. There may be mild wall thickening of the colon in this region which is not well assessed in due to nondistention. Potential but not definite inflamed fat lobule in this region. Differential considerations include epiploic appendagitis or short segment of colitis. Recommend direct visualization with colonoscopy after course of treatment to exclude the remote possibility of underlying colonic neoplasm. 2. Small bilateral fat containing inguinal hernias. Diminutive fat containing umbilical hernia.  Patient has been treated with Augmentin for 10 days.  Patient has been rescheduled to see Willette Cluster RNP on 02/09/23.  I tentatively scheduled a colon/endo on 10/2 with Dr. Adela Lank.  Patient is notified that we will hold a spot, but Willette Cluster RNP will determine the next steps and that this plan may change based on her exam and office visit.  Patient agreed to the plan and thanked me for working her in.

## 2023-01-29 ENCOUNTER — Other Ambulatory Visit (HOSPITAL_COMMUNITY): Payer: Self-pay | Admitting: Family Medicine

## 2023-01-29 DIAGNOSIS — Z1231 Encounter for screening mammogram for malignant neoplasm of breast: Secondary | ICD-10-CM

## 2023-02-04 ENCOUNTER — Ambulatory Visit (HOSPITAL_COMMUNITY)
Admission: RE | Admit: 2023-02-04 | Discharge: 2023-02-04 | Disposition: A | Discharge status: Home / Self Care | Payer: Medicare Other | Source: Ambulatory Visit | Attending: Family Medicine | Admitting: Family Medicine

## 2023-02-04 ENCOUNTER — Encounter (HOSPITAL_COMMUNITY): Payer: Self-pay

## 2023-02-04 DIAGNOSIS — Z1231 Encounter for screening mammogram for malignant neoplasm of breast: Secondary | ICD-10-CM | POA: Insufficient documentation

## 2023-02-09 ENCOUNTER — Encounter: Payer: Self-pay | Admitting: Nurse Practitioner

## 2023-02-09 ENCOUNTER — Ambulatory Visit (INDEPENDENT_AMBULATORY_CARE_PROVIDER_SITE_OTHER): Payer: Medicare Other | Admitting: Nurse Practitioner

## 2023-02-09 VITALS — BP 130/80 | HR 67 | Ht 61.5 in | Wt 138.0 lb

## 2023-02-09 DIAGNOSIS — K219 Gastro-esophageal reflux disease without esophagitis: Secondary | ICD-10-CM | POA: Diagnosis not present

## 2023-02-09 DIAGNOSIS — R933 Abnormal findings on diagnostic imaging of other parts of digestive tract: Secondary | ICD-10-CM

## 2023-02-09 DIAGNOSIS — R1030 Lower abdominal pain, unspecified: Secondary | ICD-10-CM

## 2023-02-09 MED ORDER — NA SULFATE-K SULFATE-MG SULF 17.5-3.13-1.6 GM/177ML PO SOLN: 1.0000 | ORAL | 0 refills | Status: DC

## 2023-02-09 MED ORDER — FAMOTIDINE 40 MG PO TABS: 40.0000 mg | ORAL_TABLET | Freq: Every day | ORAL | Status: DC

## 2023-02-09 NOTE — Progress Notes (Signed)
Primary GI:  Debra Patrick, MD  ASSESSMENT & PLAN   67 y.o. yo female with a past medical history consisting of, but not necessarily limited to chronic GERD, erosive gastritis, bilateral inguinal hernias.   Chronic GERD without Barrett's. No significant improvement in pyrosis / regurgitation on Pantoprazole.  --Discussed anti-reflux measures such as avoidance of late meals / bedtime snacks, HOB elevation (or use of wedge pillow), weight reduction ( if applicable)  / maintaining a healthy BMI ( body mass index),  and avoidance of trigger foods and caffeine.  --Pantoprazole 40 mg every morning --Famotidine 40 mg at bedtime.  -- Already scheduled for EGDon 10/2 ( see phone note from 8/13). The risks and benefits of EGD with possible biopsies were discussed with the patient who agrees to proceed.   Lower abdominal pain ( now resolved)  / abnormal left colon on CT scan showing possible mild wall thickening and fat stranding.  -- Pain significantly improved after course of Augmentin.  May have had mild diverticulitis.  Epiploic appendagitis was also on the list of possibilities.  Findings could represent colon neoplasm but seems less likely.  She is already scheduled for colonoscopy on 10/2 (see phone note from 8 /16). The risks and benefits of colonoscopy with possible polypectomy / biopsies were discussed and the patient agrees to proceed.    HPI   Brief GI History   Patient was last seen in 2016 for evaluation of anemia and GERD   INTERVAL HISTORY   Chief complaint :  GERD and abdominal pain   Debra Matthews is here for evaluation of 2 GI problems.  First, she has chronic GERD with heartburn and regurgitation.  Symptoms are generally postprandial.  Symptoms seem to be getting worse lately.  She took Zantac for many years.  She then changed to famotidine.  Neither one of those have really ever alleviated her GERD symptoms.  PCP recently tried her on pantoprazole.  She took it for about 3 weeks but  did not have any significant improvement in GERD symptoms.  She decided to stop the pantoprazole because she did not want to "mask" symptoms. No dysphagia.   Several weeks ago, Debra Matthews developed severe stabbing mid lower abdominal pain . No associated urinary discomfort, bowel changes, or blood in stool. After 3-4 days of pain she saw her PCP . UA was unremarkable.   01/14/23 CT AP w IV contrast.  No p.o. contrast 1. Fat stranding in the left lower quadrant is adjacent to a nondistended loop of sigmoid colon. There may be mild wall thickening of the colon in this region which is not well assessed in due to nondistention. Potential but not definite inflamed fat lobule in this region. Differential considerations include epiploic appendagitis or short segment of colitis. Recommend direct visualization with colonoscopy after course of treatment to exclude the remote possibility of underlying colonic neoplasm. 2. Small bilateral fat containing inguinal hernias. Diminutive fat containing umbilical hernia.  PCP treated with 10 days of Augmentin. Pain has significantly improved.   Previous GI Studies   **May not be a complete list of studies  October 2016 EGD  and colonoscopy  for ongoing reflux and iron deficiency anemia EGD -- Normal esophagus.  Erosive erythematous gastropathy without focal ulceration, biopsies obtained.  Suspect findings related to the underlying anemia.  Normal duodenum  Colonoscopy  -- Adequate prep.  Normal examined colon without polyps or inflammatory change.  Biopsies to rule out to microscopic colitis   Labs  Latest Ref Rng & Units 12/30/2022   11:10 AM 11/05/2022   10:04 AM 09/19/2022    1:17 PM  CBC  WBC 4.0 - 10.5 K/uL 4.7  4.3  6.7   Hemoglobin 12.0 - 15.0 g/dL 10.2  72.5  36.6   Hematocrit 36.0 - 46.0 % 41.8  41.5  42.1   Platelets 150.0 - 400.0 K/uL 219.0  184  231.0     No results found for: "LIPASE"    Latest Ref Rng & Units 12/30/2022   11:10 AM  09/19/2022    1:17 PM 02/18/2022    9:19 AM  CMP  Glucose 70 - 99 mg/dL 97  79    BUN 6 - 23 mg/dL 15  10    Creatinine 4.40 - 1.20 mg/dL 3.47  4.25    Sodium 956 - 145 mEq/L 139  140    Potassium 3.5 - 5.1 mEq/L 4.6  4.3    Chloride 96 - 112 mEq/L 105  105    CO2 19 - 32 mEq/L 26  26    Calcium 8.4 - 10.5 mg/dL 38.7  9.7    Total Protein 6.0 - 8.3 g/dL  7.4  7.0   Total Bilirubin 0.2 - 1.2 mg/dL  0.6  1.0   Alkaline Phos 39 - 117 U/L  72  60   AST 0 - 37 U/L  22  24   ALT 0 - 35 U/L  17  21      Past Medical History:  Diagnosis Date   Abdominal pain, epigastric    Anemia    hx of iron def.   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)    GERD (gastroesophageal reflux disease)    Headache    hx of    History of chicken pox    History of shingles    RLS (restless legs syndrome)    Subchondral insufficiency fracture of femoral condyle, right, initial encounter (HCC) 07/15/2016   Past Surgical History:  Procedure Laterality Date   KNEE ARTHROSCOPY WITH MENISCAL REPAIR Right 05/18/2017   Procedure: KNEE ARTHROSCOPY WITH PARTIAL LATERAL MENISCECTOMY;  Surgeon: Durene Romans, MD;  Location: [facility];  Service: Orthopedics;  Laterality: Right;  Adductor Block   MEDIAL PARTIAL KNEE REPLACEMENT Right 05/18/2017   Procedure: RIGHT MEDIAL PARTIAL KNEE REPLACEMENT;  Surgeon: Durene Romans, MD;  Location: [facility];  Service: Orthopedics;  Laterality: Right;  Adductor Block   TUBAL LIGATION     Family History  Problem Relation Age of Onset   Scoliosis Mother    Mitral valve prolapse Mother    Colon cancer Maternal Aunt    Social History   Tobacco Use   Smoking status: Never   Smokeless tobacco: Never  Vaping Use   Vaping status: Never Used  Substance Use Topics   Alcohol use: No    Alcohol/week: 0.0 standard drinks of alcohol   Drug  use: No   Current Outpatient Medications  Medication Sig Dispense Refill   atorvastatin (LIPITOR) 10 MG tablet Take 1 tablet (10 mg total) by mouth daily. (May start out taking every other day for a week or two) 90 tablet 3   butalbital-acetaminophen-caffeine (FIORICET) 50-325-40 MG tablet Take 1 tablet by mouth every 6 (six) hours as needed for headache.  30 tablet 0   meclizine (ANTIVERT) 25 MG tablet Take 1 tablet (25 mg total) by mouth 3 (three) times daily as needed for dizziness. 30 tablet 3   pantoprazole (PROTONIX) 40 MG tablet Take 1 tablet (40 mg total) by mouth daily. 30 tablet 3   pramipexole (MIRAPEX) 1 MG tablet Take 1.5-3 tablets (1.5-3 mg total) by mouth daily. 1.5 tablet daily with additional 1.5 tab as needed for break-through 270 tablet 3   traZODone (DESYREL) 50 MG tablet Take 2 tablets (100 mg total) by mouth at bedtime. 180 tablet 1   No current facility-administered medications for this visit.   Allergies  Allergen Reactions   Pseudoephedrine Other (See Comments)    Insomnia   Prednisone     Pt feels bad, headache, no appetite.   Review of Systems: All systems reviewed and negative except where noted in HPI.   Wt Readings from Last 3 Encounters:  01/14/23 138 lb 8 oz (62.8 kg)  12/30/22 138 lb 12.8 oz (63 kg)  11/05/22 139 lb (63 kg)    Physical Exam:  BP 130/80   Pulse 67   Ht 5' 1.5" (1.562 m)   Wt 138 lb (62.6 kg)   BMI 25.65 kg/m  Constitutional:  Pleasant, generally well appearing female in no acute distress. Psychiatric:  Normal mood and affect. Behavior is normal. EENT: Pupils normal.  Conjunctivae are normal. No scleral icterus. Neck supple.  Cardiovascular: Normal rate, regular rhythm.  Pulmonary/chest: Effort normal and breath sounds normal. No wheezing, rales or rhonchi. Abdominal: Soft, nondistended, nontender. Bowel sounds active throughout. There are no masses palpable. No hepatomegaly. Neurological: Alert and oriented to person place and  time.   Willette Cluster, NP  02/09/2023, 8:49 AM  Cc:  Referring Provider Zandra Abts, NP

## 2023-02-09 NOTE — Progress Notes (Signed)
Agree with assessment and plan as outlined.  

## 2023-02-09 NOTE — Patient Instructions (Addendum)
_______________________________________________________  If your blood pressure at your visit was 140/90 or greater, please contact your primary care physician to follow up on this. _______________________________________________________  If you are age 68 or older, your body mass index should be between 23-30. Your Body mass index is 25.65 kg/m. If this is out of the aforementioned range listed, please consider follow up with your Primary Care Provider.  The [facility] providers would like to encourage you to use [facility] to communicate with providers for non-urgent requests or questions.  Due to long hold times on the telephone, sending your provider a message by [facility] may be a faster and more efficient way to get a response.  Please allow 48 business hours for a response.  Please remember that this is for non-urgent requests.  _______________________________________________________  Please be sure to take pantoprazole 40mg  one tablet every morning before breakfast meal each day and famotidine 40mg  one tablet at bedtime each night.  These medications you already have at home.   Acid Reflux  Below are some measures you can take to possibly improve acid reflux symptoms . We may have discussed some of these today in the office. Not everything on this list may apply to you   --If you are taking anti-reflux ( GERD) medication be sure to take it 30 minutes before breakfast and if taking twice daily then also second dose should be 30 minutes before dinner.   --Avoid late meals / bedtime snacks.   --Avoid trigger foods ( foods which you know tend to aggravate you reflux symptoms). Some common trigger foods include spicy foods, fatty foods, acidic foods, chocolate and caffeine.  --Elevate the head of bed 6-8 inches on blocks or bricks. If not able to elevate the head of the bed consider purchasing a wedge pillow to sleep on.    --Weight reduction / maintain a healthy BMI ( body mass index) may be help  with reflux symptoms  --Sometimes with the above mentioned "lifestyle changes" patients are able to reduce the amount of GERD medications they take. Our goal is to have you on the lowest effective dose of medication  You have been scheduled for an endoscopy and colonoscopy. Please follow the written instructions given to you at your visit today.  Please pick up your prep supplies at the pharmacy within the next 1-3 days.  If you use inhalers (even only as needed), please bring them with you on the day of your procedure.  DO NOT TAKE 7 DAYS PRIOR TO TEST- Trulicity (dulaglutide) Ozempic, Wegovy (semaglutide) Mounjaro (tirzepatide) Bydureon Bcise (exanatide extended release)  DO NOT TAKE 1 DAY PRIOR TO YOUR TEST Rybelsus (semaglutide) Adlyxin (lixisenatide) Victoza (liraglutide) Byetta (exanatide) ___________________________________________________________________________  Due to recent changes in healthcare laws, you may see the results of your imaging and laboratory studies on MyChart before your provider has had a chance to review them.  We understand that in some cases there may be results that are confusing or concerning to you. Not all laboratory results come back in the same time frame and the provider may be waiting for multiple results in order to interpret others.  Please give [place] 48 hours in order for your provider to thoroughly review all the results before contacting the office for clarification of your results.   Thank you for entrusting me with your care and choosing [facility].  Willette Cluster, NP

## 2023-03-03 ENCOUNTER — Ambulatory Visit: Payer: Medicare Other | Admitting: Gastroenterology

## 2023-03-11 ENCOUNTER — Ambulatory Visit (INDEPENDENT_AMBULATORY_CARE_PROVIDER_SITE_OTHER): Payer: Medicare Other | Admitting: *Deleted

## 2023-03-11 DIAGNOSIS — Z0000 Encounter for general adult medical examination without abnormal findings: Secondary | ICD-10-CM | POA: Diagnosis not present

## 2023-03-11 NOTE — Progress Notes (Signed)
Subjective:   Debra Matthews is a 67 y.o. female who presents for Medicare Annual (Subsequent) preventive examination.  Visit Complete: Virtual  I connected with  Debra Matthews on 03/11/23 by a audio enabled telemedicine application and verified that I am speaking with the correct person using two identifiers.  Patient Location: Home  Provider Location: Home Office  I discussed the limitations of evaluation and management by telemedicine. The patient expressed understanding and agreed to proceed.   Because this visit was a virtual/telehealth visit, some criteria may be missing or patient reported. Any vitals not documented were not able to be obtained and vitals that have been documented are patient reported.    Cardiac Risk Factors include: advanced age (>71men, >41 women)     Objective:    Today's Vitals   03/11/23 0932  PainSc: 2    There is no height or weight on file to calculate BMI.     03/11/2023    9:37 AM 02/26/2022   10:36 AM 10/01/2020    2:19 PM 05/18/2017    7:55 PM 05/18/2017    7:19 AM 05/13/2017   11:19 AM  Advanced Directives  Does Patient Have a Medical Advance Directive? Yes Yes Yes  Yes Yes  Type of Estate agent of [employer] Power of [place];Living will Healthcare Power of [place];Living will Healthcare Power of [place];Living will Healthcare Power of [place];Living will Healthcare Power of Attorney  Does patient want to make changes to medical advance directive?    No - Patient declined  No - Patient declined  Copy of Healthcare Power of Attorney in Chart? No - copy requested No - copy requested No - copy requested No - copy requested No - copy requested No - copy requested    Current Medications (verified) Outpatient Encounter Medications as of 03/11/2023  Medication Sig   atorvastatin (LIPITOR) 10 MG tablet Take 1 tablet (10 mg total) by mouth daily. (May start out taking every other day for a week or two)    famotidine (PEPCID) 40 MG tablet Take 1 tablet (40 mg total) by mouth at bedtime.   meclizine (ANTIVERT) 25 MG tablet Take 1 tablet (25 mg total) by mouth 3 (three) times daily as needed for dizziness.   Na Sulfate-K Sulfate-Mg Sulf (SUPREP BOWEL PREP KIT) 17.5-3.13-1.6 GM/177ML SOLN Take 1 kit by mouth as directed.   pantoprazole (PROTONIX) 40 MG tablet Take 1 tablet (40 mg total) by mouth daily.   pramipexole (MIRAPEX) 1 MG tablet Take 1.5-3 tablets (1.5-3 mg total) by mouth daily. 1.5 tablet daily with additional 1.5 tab as needed for break-through   traZODone (DESYREL) 50 MG tablet Take 2 tablets (100 mg total) by mouth at bedtime.   [DISCONTINUED] butalbital-acetaminophen-caffeine (FIORICET) 50-325-40 MG tablet Take 1 tablet by mouth every 6 (six) hours as needed for headache.   [DISCONTINUED] amoxicillin-clavulanate (AUGMENTIN) 875-125 MG tablet Take 1 tablet by mouth 2 (two) times daily.   No facility-administered encounter medications on file as of 03/11/2023.    Allergies (verified) Pseudoephedrine and Prednisone   History: Past Medical History:  Diagnosis Date   Abdominal pain, epigastric    Anemia    hx of iron def.   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)    GERD (gastroesophageal  reflux disease)    Headache    hx of    History of chicken pox    History of shingles    RLS (restless legs syndrome)    Subchondral insufficiency fracture of femoral condyle, right, initial encounter (HCC) 07/15/2016   Past Surgical History:  Procedure Laterality Date   KNEE ARTHROSCOPY WITH MENISCAL REPAIR Right 05/18/2017   Procedure: KNEE ARTHROSCOPY WITH PARTIAL LATERAL MENISCECTOMY;  Surgeon: Durene Romans, MD;  Location: [facility];  Service: Orthopedics;  Laterality: Right;  Adductor Block   MEDIAL PARTIAL KNEE REPLACEMENT Right 05/18/2017    Procedure: RIGHT MEDIAL PARTIAL KNEE REPLACEMENT;  Surgeon: Durene Romans, MD;  Location: [facility];  Service: Orthopedics;  Laterality: Right;  Adductor Block   TUBAL LIGATION     Family History  Problem Relation Age of Onset   Scoliosis Mother    Mitral valve prolapse Mother    Colon cancer Maternal Aunt    Social History   Socioeconomic History   Marital status: Married    Spouse name: Not on file   Number of children: 3   Years of education: Not on file   Highest education level: Some college, no degree  Occupational History   Occupation: retired  Tobacco Use   Smoking status: Never   Smokeless tobacco: Never  Vaping Use   Vaping status: Never Used  Substance and Sexual Activity   Alcohol use: No    Alcohol/week: 0.0 standard drinks of alcohol   Drug use: No   Sexual activity: Yes  Other Topics Concern   Not on file  Social History Narrative   Not on file   Social Determinants of Health   Financial Resource Strain: Low Risk  (03/11/2023)   Overall Financial Resource Strain (CARDIA)    Difficulty of Paying Living Expenses: Not hard at all  Food Insecurity: No Food Insecurity (03/11/2023)   Hunger Vital Sign    Worried About Running Out of Food in the Last Year: Never true    Ran Out of Food in the Last Year: Never true  Transportation Needs: No Transportation Needs (03/11/2023)   PRAPARE - Administrator, Civil Service (Medical): No    Lack of Transportation (Non-Medical): No  Physical Activity: Sufficiently Active (03/11/2023)   Exercise Vital Sign    Days of Exercise per Week: 4 days    Minutes of Exercise per Session: 50 min  Stress: Stress Concern Present (03/11/2023)   [employer] of Occupational Health - Occupational Stress Questionnaire    Feeling of Stress : To some extent  Social Connections: Moderately Isolated (03/11/2023)   Social Connection and Isolation Panel [NHANES]    Frequency of Communication with Friends and Family: More than  three times a week    Frequency of Social Gatherings with Friends and Family: Once a week    Attends Religious Services: Never    Database administrator or Organizations: No    Attends Engineer, structural: Never    Marital Status: Married    Tobacco Counseling Counseling given: Not Answered   Clinical Intake:  Pre-visit preparation completed: Yes  Pain : 0-10 Pain Score: 2  Pain Location: Other (Comment) (right under breast fell) Pain Descriptors / Indicators: Burning, Aching, Dull Pain Onset: 1 to 4 weeks ago Pain Frequency: Intermittent     Diabetes: No  How often do you need to have someone help you when you read instructions, pamphlets, or other written materials from your doctor or pharmacy?: 1 -  Never  Interpreter Needed?: No  Information entered by :: Remi Haggard LPN   Activities of Daily Living    03/11/2023    9:38 AM  In your present state of health, do you have any difficulty performing the following activities:  Hearing? 0  Vision? 0  Difficulty concentrating or making decisions? 1  Walking or climbing stairs? 0  Dressing or bathing? 0  Doing errands, shopping? 0  Preparing Food and eating ? N  Using the Toilet? N  In the past six months, have you accidently leaked urine? N  Do you have problems with loss of bowel control? N  Managing your Medications? N  Managing your Finances? N  Housekeeping or managing your Housekeeping? N    Patient Care Team: Sheliah Hatch, MD as PCP - General (Family Medicine) Pricilla Riffle, MD as PCP - Cardiology (Cardiology) Ubaldo Glassing Lebron Conners, MD as Consulting Physician (Internal Medicine) Waynard Reeds, MD as Consulting Physician (Obstetrics and Gynecology) Dahlia Byes, [facility] (Inactive) as Pharmacist (Pharmacist)  Indicate any recent Medical Services you may have received from other than [facility] providers in the past year (date may be approximate).     Assessment:   This is a routine wellness examination  for Debra Matthews.  Hearing/Vision screen Hearing Screening - Comments:: No trouble hearing Vision Screening - Comments:: Up to date [facility]   Goals Addressed             This Visit's Progress    Patient Stated       Get back to 100 percent from fall in 07-2022       Depression Screen    03/11/2023    9:43 AM 12/30/2022   10:41 AM 09/19/2022   12:46 PM 02/26/2022   10:34 AM 01/14/2022   10:10 AM 10/22/2020    9:31 AM 10/09/2020    9:53 AM  PHQ 2/9 Scores  PHQ - 2 Score 1 3 0 0 0 0 0  PHQ- 9 Score 7 10 4  0 0 0     Fall Risk    03/11/2023    9:34 AM 03/11/2023    9:33 AM 01/14/2023   12:45 PM 12/30/2022   10:41 AM 09/19/2022   12:46 PM  Fall Risk   Falls in the past year? 1 1 0 1 1  Number falls in past yr:  1 0 0 0  Injury with Fall?  1 0 1 1  Risk for fall due to :   No Fall Risks History of fall(s) History of fall(s)  Follow up  Falls evaluation completed;Education provided;Falls prevention discussed Falls evaluation completed Falls evaluation completed Falls evaluation completed    MEDICARE RISK AT HOME: Medicare Risk at Home Any stairs in or around the home?: No If so, are there any without handrails?: No Home free of loose throw rugs in walkways, pet beds, electrical cords, etc?: Yes Adequate lighting in your home to reduce risk of falls?: Yes Life alert?: No Use of a cane, walker or w/c?: No Grab bars in the bathroom?: No Shower chair or bench in shower?: Yes Elevated toilet seat or a handicapped toilet?: Yes  TIMED UP AND GO:  Was the test performed?  No    Cognitive Function:        03/11/2023    9:39 AM 02/26/2022   10:36 AM  6CIT Screen  What Year? 0 points 0 points  What month? 0 points 0 points  What time?  0 points  Count back from  20 0 points 0 points  Months in reverse 2 points 0 points  Repeat phrase 6 points 0 points  Total Score  0 points    Immunizations Immunization History  Administered Date(s) Administered   Fluad Quad(high Dose 65+)  05/18/2020   Influenza,inj,Quad PF,6+ Mos 06/11/2016, 05/19/2017, 05/19/2018, 05/19/2019   Influenza-Unspecified 03/16/2016, 05/16/2017   Moderna Sars-Covid-2 Vaccination 09/01/2019, 10/10/2019, 04/21/2020   PFIZER(Purple Top)SARS-COV-2 Vaccination 09/01/2019, 10/10/2019, 04/21/2020, 11/01/2020, 04/18/2021   Tdap 02/11/2016   Zoster Recombinant(Shingrix) 06/12/2017, 09/17/2017    TDAP status: Up to date  Flu Vaccine status: Due, Education has been provided regarding the importance of this vaccine. Advised may receive this vaccine at local pharmacy or Health Dept. Aware to provide a copy of the vaccination record if obtained from local pharmacy or Health Dept. Verbalized acceptance and understanding.  Pneumococcal vaccine status: Due, Education has been provided regarding the importance of this vaccine. Advised may receive this vaccine at local pharmacy or Health Dept. Aware to provide a copy of the vaccination record if obtained from local pharmacy or Health Dept. Verbalized acceptance and understanding.  Covid-19 vaccine status: Declined, Education has been provided regarding the importance of this vaccine but patient still declined. Advised may receive this vaccine at local pharmacy or Health Dept.or vaccine clinic. Aware to provide a copy of the vaccination record if obtained from local pharmacy or Health Dept. Verbalized acceptance and understanding.  Qualifies for Shingles Vaccine? No   Zostavax completed Yes   Shingrix Completed?: Yes  Screening Tests Health Maintenance  Topic Date Due   INFLUENZA VACCINE  09/14/2023 (Originally 01/15/2023)   Pneumonia Vaccine 57+ Years old (1 of 1 - PCV) 03/10/2024 (Originally 10/09/2019)   MAMMOGRAM  02/04/2024   Medicare Annual Wellness (AWV)  03/10/2024   Colonoscopy  03/20/2025   DTaP/Tdap/Td (2 - Td or Tdap) 02/10/2026   DEXA SCAN  Completed   Zoster Vaccines- Shingrix  Completed   HPV VACCINES  Aged Out   COVID-19 Vaccine  Discontinued    Hepatitis C Screening  Discontinued    Health Maintenance  There are no preventive care reminders to display for this patient.   Colorectal cancer screening: Type of screening: Colonoscopy. Completed 2016. Repeat every 10 years   Mammogram status: Completed 2024. Repeat every year  Bone Density status: Completed 2022. Results reflect: Bone density results: OSTEOPOROSIS. Repeat every 2 years.  Lung Cancer Screening: (Low Dose CT Chest recommended if Age 45-80 years, 20 pack-year currently smoking OR have quit w/in 15years.) does not qualify.   Lung Cancer Screening Referral:   Additional Screening:  Hepatitis C Screening never done  Vision Screening: Recommended annual ophthalmology exams for early detection of glaucoma and other disorders of the eye. Is the patient up to date with their annual eye exam?  Yes  Who is the provider or what is the name of the office in which the patient attends annual eye exams? [facility] If pt is not established with a provider, would they like to be referred to a provider to establish care? No .   Dental Screening: Recommended annual dental exams for proper oral hygiene    Community Resource Referral / Chronic Care Management: CRR required this visit?  No   CCM required this visit?  No     Plan:     I have personally reviewed and noted the following in the patient's chart:   Medical and social history Use of alcohol, tobacco or illicit drugs  Current medications and supplements including opioid  prescriptions. Patient is not currently taking opioid prescriptions. Functional ability and status Nutritional status Physical activity Advanced directives List of other physicians Hospitalizations, surgeries, and ER visits in previous 12 months Vitals Screenings to include cognitive, depression, and falls Referrals and appointments  In addition, I have reviewed and discussed with patient certain preventive protocols, quality metrics, and  best practice recommendations. A written personalized care plan for preventive services as well as general preventive health recommendations were provided to patient.     Remi Haggard, LPN   [id number]   After Visit Summary: (MyChart) Due to this being a telephonic visit, the after visit summary with patients personalized plan was offered to patient via MyChart   Nurse Notes:

## 2023-03-11 NOTE — Patient Instructions (Signed)
Debra Matthews , Thank you for taking time to come for your Medicare Wellness Visit. I appreciate your ongoing commitment to your health goals. Please review the following plan we discussed and let me know if I can assist you in the future.   Screening recommendations/referrals: Colonoscopy: scheduled Mammogram: up to date Bone Density: up to date Recommended yearly ophthalmology/optometry visit for glaucoma screening and checkup Recommended yearly dental visit for hygiene and checkup  Vaccinations: Influenza vaccine: Education provided Pneumococcal vaccine: Education provided Tdap vaccine: up to date Shingles vaccine: up to date    Advanced directives: Education provided      Preventive Care 65 Years and Older, Female Preventive care refers to lifestyle choices and visits with your health care provider that can promote health and wellness. What does preventive care include? A yearly physical exam. This is also called an annual well check. Dental exams once or twice a year. Routine eye exams. Ask your health care provider how often you should have your eyes checked. Personal lifestyle choices, including: Daily care of your teeth and gums. Regular physical activity. Eating a healthy diet. Avoiding tobacco and drug use. Limiting alcohol use. Practicing safe sex. Taking low-dose aspirin every day. Taking vitamin and mineral supplements as recommended by your health care provider. What happens during an annual well check? The services and screenings done by your health care provider during your annual well check will depend on your age, overall health, lifestyle risk factors, and family history of disease. Counseling  Your health care provider may ask you questions about your: Alcohol use. Tobacco use. Drug use. Emotional well-being. Home and relationship well-being. Sexual activity. Eating habits. History of falls. Memory and ability to understand (cognition). Work and work  Astronomer. Reproductive health. Screening  You may have the following tests or measurements: Height, weight, and BMI. Blood pressure. Lipid and cholesterol levels. These may be checked every 5 years, or more frequently if you are over 23 years old. Skin check. Lung cancer screening. You may have this screening every year starting at age 52 if you have a 30-pack-year history of smoking and currently smoke or have quit within the past 15 years. Fecal occult blood test (FOBT) of the stool. You may have this test every year starting at age 41. Flexible sigmoidoscopy or colonoscopy. You may have a sigmoidoscopy every 5 years or a colonoscopy every 10 years starting at age 40. Hepatitis C blood test. Hepatitis B blood test. Sexually transmitted disease (STD) testing. Diabetes screening. This is done by checking your blood sugar (glucose) after you have not eaten for a while (fasting). You may have this done every 1-3 years. Bone density scan. This is done to screen for osteoporosis. You may have this done starting at age 33. Mammogram. This may be done every 1-2 years. Talk to your health care provider about how often you should have regular mammograms. Talk with your health care provider about your test results, treatment options, and if necessary, the need for more tests. Vaccines  Your health care provider may recommend certain vaccines, such as: Influenza vaccine. This is recommended every year. Tetanus, diphtheria, and acellular pertussis (Tdap, Td) vaccine. You may need a Td booster every 10 years. Zoster vaccine. You may need this after age 30. Pneumococcal 13-valent conjugate (PCV13) vaccine. One dose is recommended after age 66. Pneumococcal polysaccharide (PPSV23) vaccine. One dose is recommended after age 80. Talk to your health care provider about which screenings and vaccines you need and how often you  need them. This information is not intended to replace advice given to you by  your health care provider. Make sure you discuss any questions you have with your health care provider. Document Released: 06/29/2015 Document Revised: 02/20/2016 Document Reviewed: 04/03/2015 Elsevier Interactive Patient Education  2017 [employer].  Fall Prevention in the Home Falls can cause injuries. They can happen to people of all ages. There are many things you can do to make your home safe and to help prevent falls. What can I do on the outside of my home? Regularly fix the edges of walkways and driveways and fix any cracks. Remove anything that might make you trip as you walk through a door, such as a raised step or threshold. Trim any bushes or trees on the path to your home. Use bright outdoor lighting. Clear any walking paths of anything that might make someone trip, such as rocks or tools. Regularly check to see if handrails are loose or broken. Make sure that both sides of any steps have handrails. Any raised decks and porches should have guardrails on the edges. Have any leaves, snow, or ice cleared regularly. Use sand or salt on walking paths during winter. Clean up any spills in your garage right away. This includes oil or grease spills. What can I do in the bathroom? Use night lights. Install grab bars by the toilet and in the tub and shower. Do not use towel bars as grab bars. Use non-skid mats or decals in the tub or shower. If you need to sit down in the shower, use a plastic, non-slip stool. Keep the floor dry. Clean up any water that spills on the floor as soon as it happens. Remove soap buildup in the tub or shower regularly. Attach bath mats securely with double-sided non-slip rug tape. Do not have throw rugs and other things on the floor that can make you trip. What can I do in the bedroom? Use night lights. Make sure that you have a light by your bed that is easy to reach. Do not use any sheets or blankets that are too big for your bed. They should not hang  down onto the floor. Have a firm chair that has side arms. You can use this for support while you get dressed. Do not have throw rugs and other things on the floor that can make you trip. What can I do in the kitchen? Clean up any spills right away. Avoid walking on wet floors. Keep items that you use a lot in easy-to-reach places. If you need to reach something above you, use a strong step stool that has a grab bar. Keep electrical cords out of the way. Do not use floor polish or wax that makes floors slippery. If you must use wax, use non-skid floor wax. Do not have throw rugs and other things on the floor that can make you trip. What can I do with my stairs? Do not leave any items on the stairs. Make sure that there are handrails on both sides of the stairs and use them. Fix handrails that are broken or loose. Make sure that handrails are as long as the stairways. Check any carpeting to make sure that it is firmly attached to the stairs. Fix any carpet that is loose or worn. Avoid having throw rugs at the top or bottom of the stairs. If you do have throw rugs, attach them to the floor with carpet tape. Make sure that you have a light switch  at the top of the stairs and the bottom of the stairs. If you do not have them, ask someone to add them for you. What else can I do to help prevent falls? Wear shoes that: Do not have high heels. Have rubber bottoms. Are comfortable and fit you well. Are closed at the toe. Do not wear sandals. If you use a stepladder: Make sure that it is fully opened. Do not climb a closed stepladder. Make sure that both sides of the stepladder are locked into place. Ask someone to hold it for you, if possible. Clearly mark and make sure that you can see: Any grab bars or handrails. First and last steps. Where the edge of each step is. Use tools that help you move around (mobility aids) if they are needed. These  include: Canes. Walkers. Scooters. Crutches. Turn on the lights when you go into a dark area. Replace any light bulbs as soon as they burn out. Set up your furniture so you have a clear path. Avoid moving your furniture around. If any of your floors are uneven, fix them. If there are any pets around you, be aware of where they are. Review your medicines with your doctor. Some medicines can make you feel dizzy. This can increase your chance of falling. Ask your doctor what other things that you can do to help prevent falls. This information is not intended to replace advice given to you by your health care provider. Make sure you discuss any questions you have with your health care provider. Document Released: 03/29/2009 Document Revised: 11/08/2015 Document Reviewed: 07/07/2014 Elsevier Interactive Patient Education  2017 [employer].

## 2023-03-18 ENCOUNTER — Ambulatory Visit: Payer: Medicare Other | Admitting: Gastroenterology

## 2023-03-18 ENCOUNTER — Encounter: Payer: Self-pay | Admitting: Gastroenterology

## 2023-03-18 VITALS — BP 115/62 | HR 63 | Temp 98.0°F | Resp 10 | Ht 61.0 in | Wt 138.0 lb

## 2023-03-18 DIAGNOSIS — R933 Abnormal findings on diagnostic imaging of other parts of digestive tract: Secondary | ICD-10-CM

## 2023-03-18 DIAGNOSIS — K209 Esophagitis, unspecified without bleeding: Secondary | ICD-10-CM | POA: Diagnosis not present

## 2023-03-18 DIAGNOSIS — D123 Benign neoplasm of transverse colon: Secondary | ICD-10-CM

## 2023-03-18 DIAGNOSIS — K635 Polyp of colon: Secondary | ICD-10-CM | POA: Diagnosis not present

## 2023-03-18 DIAGNOSIS — K219 Gastro-esophageal reflux disease without esophagitis: Secondary | ICD-10-CM

## 2023-03-18 DIAGNOSIS — R1030 Lower abdominal pain, unspecified: Secondary | ICD-10-CM

## 2023-03-18 MED ORDER — SODIUM CHLORIDE 0.9 % IV SOLN
500.0000 mL | INTRAVENOUS | Status: DC
Start: 2023-03-18 — End: 2023-03-18

## 2023-03-18 NOTE — Patient Instructions (Signed)
Discharge instructions given. Handouts on polyps,Hemorrhoids and Esophagitis. Resume previous medications. YOU HAD AN ENDOSCOPIC PROCEDURE TODAY AT THE [facility]:   Refer to the procedure report that was given to you for any specific questions about what was found during the examination.  If the procedure report does not answer your questions, please call your gastroenterologist to clarify.  If you requested that your care partner not be given the details of your procedure findings, then the procedure report has been included in a sealed envelope for you to review at your convenience later.  YOU SHOULD EXPECT: Some feelings of bloating in the abdomen. Passage of more gas than usual.  Walking can help get rid of the air that was put into your GI tract during the procedure and reduce the bloating. If you had a lower endoscopy (such as a colonoscopy or flexible sigmoidoscopy) you may notice spotting of blood in your stool or on the toilet paper. If you underwent a bowel prep for your procedure, you may not have a normal bowel movement for a few days.  Please Note:  You might notice some irritation and congestion in your nose or some drainage.  This is from the oxygen used during your procedure.  There is no need for concern and it should clear up in a day or so.  SYMPTOMS TO REPORT IMMEDIATELY:  Following lower endoscopy (colonoscopy or flexible sigmoidoscopy):  Excessive amounts of blood in the stool  Significant tenderness or worsening of abdominal pains  Swelling of the abdomen that is new, acute  Fever of 100F or higher  Following upper endoscopy (EGD)  Vomiting of blood or coffee ground material  New chest pain or pain under the shoulder blades  Painful or persistently difficult swallowing  New shortness of breath  Fever of 100F or higher  Black, tarry-looking stools  For urgent or emergent issues, a gastroenterologist can be reached at any hour by calling [phone]  [phone]. Do not use MyChart messaging for urgent concerns.    DIET:  We do recommend a small meal at first, but then you may proceed to your regular diet.  Drink plenty of fluids but you should avoid alcoholic beverages for 24 hours.  ACTIVITY:  You should plan to take it easy for the rest of today and you should NOT DRIVE or use heavy machinery until tomorrow (because of the sedation medicines used during the test).    FOLLOW UP: Our staff will call the number listed on your records the next business day following your procedure.  We will call around 7:15- 8:00 am to check on you and address any questions or concerns that you may have regarding the information given to you following your procedure. If we do not reach you, we will leave a message.     If any biopsies were taken you will be contacted by phone or by letter within the next 1-3 weeks.  Please call us at [phone] if you have not heard about the biopsies in 3 weeks.    SIGNATURES/CONFIDENTIALITY: You and/or your care partner have signed paperwork which will be entered into your electronic medical record.  These signatures attest to the fact that that the information above on your After Visit Summary has been reviewed and is understood.  Full responsibility of the confidentiality of this discharge information lies with you and/or your care-partner.

## 2023-03-18 NOTE — Op Note (Signed)
[facility] Patient Name: Debra Matthews Procedure Date: 03/18/2023 8:00 AM MRN: [id number] Endoscopist: Viviann Spare P. Adela Lank , MD, [id number] Age: 67 Referring MD:  Date of Birth: 02-Sep-1955 Gender: Female Account #: [id number] Procedure:                Colonoscopy Indications:              Abnormal CT of the GI tract - nonspecific                            inflammatory changes in the left colon associated                            with significant abdominal pain July 2024, symptoms                            have resolved. Medicines:                Monitored Anesthesia Care Procedure:                Pre-Anesthesia Assessment:                           - Prior to the procedure, a History and Physical                            was performed, and patient medications and                            allergies were reviewed. The patient's tolerance of                            previous anesthesia was also reviewed. The risks                            and benefits of the procedure and the sedation                            options and risks were discussed with the patient.                            All questions were answered, and informed consent                            was obtained. Prior Anticoagulants: The patient has                            taken no anticoagulant or antiplatelet agents. ASA                            Grade Assessment: II - A patient with mild systemic                            disease. After reviewing the risks and benefits,  the patient was deemed in satisfactory condition to                            undergo the procedure.                           After obtaining informed consent, the colonoscope                            was passed under direct vision. Throughout the                            procedure, the patient's blood pressure, pulse, and                            oxygen saturations were monitored continuously.  The                            [id number]-[id number] Colonoscope [id number] was introduced                            through the anus and advanced to the the cecum,                            identified by appendiceal orifice and ileocecal                            valve. The colonoscopy was performed without                            difficulty. The patient tolerated the procedure                            well. The quality of the bowel preparation was                            good. The ileocecal valve, appendiceal orifice, and                            rectum were photographed. Scope In: 8:13:13 AM Scope Out: 8:28:33 AM Scope Withdrawal Time: 0 hours 10 minutes 25 seconds  Total Procedure Duration: 0 hours 15 minutes 20 seconds  Findings:                 The perianal and digital rectal examinations were                            normal.                           There was a lipoma, in the ascending colon.                           A 4 to 5 mm polyp was found in the hepatic flexure.  The polyp was sessile. The polyp was removed with a                            cold snare. Resection and retrieval were complete.                           Internal hemorrhoids were found during                            retroflexion. The hemorrhoids were small.                           The exam was otherwise without abnormality.                            Angulated turns noted in the sigmoid colon, water                            immersion used to navigate the left colon. NO                            diverticuli noted. Complications:            No immediate complications. Estimated blood loss:                            Minimal. Estimated Blood Loss:     Estimated blood loss was minimal. Impression:               - Lipoma in the ascending colon.                           - One 4 to 5 mm polyp at the hepatic flexure,                            removed with a cold snare. Resected and  retrieved.                           - Internal hemorrhoids.                           - The examination was otherwise normal.                           No concerning pathology to correlate with CT                            changes. She could have had ischemic colitis vs.                            epiploic appendagitis causing symptoms. Recommendation:           - Patient has a contact number available for                            emergencies. The signs and  symptoms of potential                            delayed complications were discussed with the                            patient. Return to normal activities tomorrow.                            Written discharge instructions were provided to the                            patient.                           - Resume previous diet.                           - Continue present medications.                           - Await pathology results. Viviann Spare P. Nacole Fluhr, MD 03/18/2023 8:36:36 AM This report has been signed electronically.

## 2023-03-18 NOTE — Progress Notes (Signed)
Called to room to assist during endoscopic procedure.  Patient ID and intended procedure confirmed with present staff. Received instructions for my participation in the procedure from the performing physician.  

## 2023-03-18 NOTE — Progress Notes (Signed)
Uneventful anesthetic. Report to pacu rn. Vss. Care resumed by rn. 

## 2023-03-18 NOTE — Progress Notes (Signed)
Pt's states no medical or surgical changes since previsit or office visit. 

## 2023-03-18 NOTE — Op Note (Signed)
[facility] Patient Name: Debra Matthews Procedure Date: 03/18/2023 8:01 AM MRN: [id number] Endoscopist: Viviann Spare P. Adela Lank , MD, [id number] Age: 67 Referring MD:  Date of Birth: 09-27-1955 Gender: Female Account #: [id number] Procedure:                Upper GI endoscopy Indications:              Follow-up of gastro-esophageal reflux disease -                            improved with protonix 40mg  / day but not resolved. Medicines:                Monitored Anesthesia Care Procedure:                Pre-Anesthesia Assessment:                           - Prior to the procedure, a History and Physical                            was performed, and patient medications and                            allergies were reviewed. The patient's tolerance of                            previous anesthesia was also reviewed. The risks                            and benefits of the procedure and the sedation                            options and risks were discussed with the patient.                            All questions were answered, and informed consent                            was obtained. Prior Anticoagulants: The patient has                            taken no anticoagulant or antiplatelet agents. ASA                            Grade Assessment: II - A patient with mild systemic                            disease. After reviewing the risks and benefits,                            the patient was deemed in satisfactory condition to                            undergo the procedure.  After obtaining informed consent, the endoscope was                            passed under direct vision. Throughout the                            procedure, the patient's blood pressure, pulse, and                            oxygen saturations were monitored continuously. The                            Olympus Scope [id number] was introduced through the                             mouth, and advanced to the second part of duodenum.                            The upper GI endoscopy was accomplished without                            difficulty. The patient tolerated the procedure                            well. Scope In: Scope Out: Findings:                 Esophagogastric landmarks were identified: the                            Z-line was found at 37 cm, the gastroesophageal                            junction was found at 37 cm and the upper extent of                            the gastric folds was found at 38 cm from the                            incisors.                           LA Grade A esophagitis was found at the GEJ                           The Z-line was slightly irregular with a small                            tongue of salmon colored mucosa, but did not meet                            criteria for Barrett's.                           There was  a benign small gastric inlet patch in the                            proximal esophagus. The exam of the esophagus was                            otherwise normal.                           The entire examined stomach was normal.                           The examined duodenum was normal. Complications:            No immediate complications. Estimated blood loss:                            None. Estimated Blood Loss:     Estimated blood loss: none. Impression:               - Esophagogastric landmarks identified.                           - LA Grade A esophagitis.                           - Z-line irregular but did not meet criteria for                            Barrett's.                           - Benign proximal gastric inlet patch of the                            esophagus.                           - Normal stomach.                           - Normal examined duodenum. Recommendation:           - Patient has a contact number available for                            emergencies. The signs and  symptoms of potential                            delayed complications were discussed with the                            patient. Return to normal activities tomorrow.                            Written discharge instructions were provided to the  patient.                           - Resume previous diet.                           - Continue present medications.                           - Increase protonix to twice daily dosing for 4                            weeks and then use lowest dose needed to control                            symptoms Deleah Tison P. Mossie Gilder, MD 03/18/2023 8:40:43 AM This report has been signed electronically.

## 2023-03-18 NOTE — Progress Notes (Signed)
[facility] History and Physical   Primary Care Physician:  Sheliah Hatch, MD   Reason for Procedure:   Abdominal pain / abnormal CT scan, GERD  Plan:    Colonoscopy and EGD     HPI: Debra Matthews is a 67 y.o. female  here for EGD and colonoscopy -   Patient had abdominal pain and abnormal CT scan 7/31 showing inflammation in the sigmoid colon, possible epiploic appendagitis vs. Short segment colitis vs. Neoplasm. Colonoscopy to further evaluate. Exam in 03/2015 was normal. Treated with Augmentin and symptoms improved.   Also having worsening GERD, now on protonix 40mg  / day and pepcid 40mg  q HS. EGD to further evaluate. Symptoms improved on protonix 40mg  but not resolved.    No family history of colon cancer known. Otherwise feels well without any cardiopulmonary symptoms.   I have discussed risks / benefits of anesthesia and endoscopic procedure with [name] and they wish to proceed with the exams as outlined today.    Past Medical History:  Diagnosis Date   Abdominal pain, epigastric    Anemia    hx of iron def.   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)    GERD (gastroesophageal reflux disease)    Headache    hx of    History of chicken pox    History of shingles    RLS (restless legs syndrome)    Subchondral insufficiency fracture of femoral condyle, right, initial encounter (HCC) 07/15/2016    Past Surgical History:  Procedure Laterality Date   KNEE ARTHROSCOPY WITH MENISCAL REPAIR Right 05/18/2017   Procedure: KNEE ARTHROSCOPY WITH PARTIAL LATERAL MENISCECTOMY;  Surgeon: Durene Romans, MD;  Location: [facility];  Service: Orthopedics;  Laterality: Right;  Adductor Block   MEDIAL PARTIAL KNEE REPLACEMENT Right 05/18/2017   Procedure: RIGHT MEDIAL PARTIAL KNEE REPLACEMENT;  Surgeon: Durene Romans,  MD;  Location: [facility];  Service: Orthopedics;  Laterality: Right;  Adductor Block   TUBAL LIGATION      Prior to Admission medications   Medication Sig Start Date End Date Taking? Authorizing Provider  atorvastatin (LIPITOR) 10 MG tablet Take 1 tablet (10 mg total) by mouth daily. (May start out taking every other day for a week or two) 01/01/23  Yes Pricilla Riffle, MD  famotidine (PEPCID) 40 MG tablet Take 1 tablet (40 mg total) by mouth at bedtime. 02/09/23  Yes Meredith Pel, NP  pantoprazole (PROTONIX) 40 MG tablet Take 1 tablet (40 mg total) by mouth daily. 12/30/22  Yes Sheliah Hatch, MD  pramipexole (MIRAPEX) 1 MG tablet Take 1.5-3 tablets (1.5-3 mg total) by mouth daily. 1.5 tablet daily with additional 1.5 tab as needed for break-through 12/30/22  Yes Sheliah Hatch, MD  traZODone (DESYREL) 50 MG tablet Take 2 tablets (100 mg total) by mouth at bedtime. 12/30/22  Yes Sheliah Hatch, MD  meclizine (ANTIVERT) 25 MG tablet Take 1 tablet (25 mg total) by mouth 3 (three) times daily as needed for dizziness. 12/30/22   Sheliah Hatch, MD    Current Outpatient Medications  Medication Sig Dispense Refill   atorvastatin (LIPITOR) 10 MG tablet Take 1 tablet (10 mg total) by mouth daily. (May start out taking every other day for a week or two)  90 tablet 3   famotidine (PEPCID) 40 MG tablet Take 1 tablet (40 mg total) by mouth at bedtime.     pantoprazole (PROTONIX) 40 MG tablet Take 1 tablet (40 mg total) by mouth daily. 30 tablet 3   pramipexole (MIRAPEX) 1 MG tablet Take 1.5-3 tablets (1.5-3 mg total) by mouth daily. 1.5 tablet daily with additional 1.5 tab as needed for break-through 270 tablet 3   traZODone (DESYREL) 50 MG tablet Take 2 tablets (100 mg total) by mouth at bedtime. 180 tablet 1   meclizine (ANTIVERT) 25 MG tablet Take 1 tablet (25 mg total) by mouth 3 (three) times daily as needed for dizziness. 30 tablet 3   Current Facility-Administered Medications   Medication Dose Route Frequency Provider Last Rate Last Admin   0.9 %  sodium chloride infusion  500 mL Intravenous Continuous Dana Debo, Willaim Rayas, MD        Allergies as of 03/18/2023 - Review Complete 03/18/2023  Allergen Reaction Noted   Pseudoephedrine Other (See Comments) 12/08/2014   Prednisone  01/19/2015    Family History  Problem Relation Age of Onset   Scoliosis Mother    Mitral valve prolapse Mother    Colon cancer Maternal Aunt    Esophageal cancer Neg Hx    Rectal cancer Neg Hx    Stomach cancer Neg Hx     Social History   Socioeconomic History   Marital status: Married    Spouse name: Not on file   Number of children: 3   Years of education: Not on file   Highest education level: Some college, no degree  Occupational History   Occupation: retired  Tobacco Use   Smoking status: Never   Smokeless tobacco: Never  Vaping Use   Vaping status: Never Used  Substance and Sexual Activity   Alcohol use: No    Alcohol/week: 0.0 standard drinks of alcohol   Drug use: No   Sexual activity: Yes  Other Topics Concern   Not on file  Social History Narrative   Not on file   Social Determinants of Health   Financial Resource Strain: Low Risk  (03/11/2023)   Overall Financial Resource Strain (CARDIA)    Difficulty of Paying Living Expenses: Not hard at all  Food Insecurity: No Food Insecurity (03/11/2023)   Hunger Vital Sign    Worried About Running Out of Food in the Last Year: Never true    Ran Out of Food in the Last Year: Never true  Transportation Needs: No Transportation Needs (03/11/2023)   PRAPARE - Administrator, Civil Service (Medical): No    Lack of Transportation (Non-Medical): No  Physical Activity: Sufficiently Active (03/11/2023)   Exercise Vital Sign    Days of Exercise per Week: 4 days    Minutes of Exercise per Session: 50 min  Stress: Stress Concern Present (03/11/2023)   [employer] of Occupational Health - Occupational  Stress Questionnaire    Feeling of Stress : To some extent  Social Connections: Moderately Isolated (03/11/2023)   Social Connection and Isolation Panel [NHANES]    Frequency of Communication with Friends and Family: More than three times a week    Frequency of Social Gatherings with Friends and Family: Once a week    Attends Religious Services: Never    Database administrator or Organizations: No    Attends Banker Meetings: Never    Marital Status: Married  Catering manager Violence: Not At Risk (03/11/2023)  Humiliation, Afraid, Rape, and Kick questionnaire    Fear of Current or Ex-Partner: No    Emotionally Abused: No    Physically Abused: No    Sexually Abused: No    Review of Systems: All other review of systems negative except as mentioned in the HPI.  Physical Exam: Vital signs BP (!) 157/71   Pulse 70   Temp 98 F (36.7 C)   Ht 5\' 1"  (1.549 m)   Wt 138 lb (62.6 kg)   SpO2 98%   BMI 26.07 kg/m   General:   Alert,  Well-developed, pleasant and cooperative in NAD Lungs:  Clear throughout to auscultation.   Heart:  Regular rate and rhythm Abdomen:  Soft, nontender and nondistended.   Neuro/Psych:  Alert and cooperative. Normal mood and affect. A and O x 3  Harlin Rain, MD [facility]

## 2023-03-19 ENCOUNTER — Telehealth: Payer: Self-pay

## 2023-03-19 ENCOUNTER — Other Ambulatory Visit: Payer: Self-pay

## 2023-03-19 NOTE — Telephone Encounter (Signed)
  Follow up Call-     03/18/2023    7:33 AM  Call back number  Post procedure Call Back phone  # [phone]  Permission to leave phone message Yes     Patient questions:  Do you have a fever, pain , or abdominal swelling? No. Pain Score  0 *  Have you tolerated food without any problems? Yes.    Have you been able to return to your normal activities? Yes.    Do you have any questions about your discharge instructions: Diet   No. Medications  No. Follow up visit  No.  Do you have questions or concerns about your Care? No.  Actions: * If pain score is 4 or above: No action needed, pain <4.

## 2023-03-20 LAB — SURGICAL PATHOLOGY

## 2023-03-23 ENCOUNTER — Encounter: Payer: Self-pay | Admitting: Internal Medicine

## 2023-03-28 ENCOUNTER — Other Ambulatory Visit: Payer: Self-pay | Admitting: Family Medicine

## 2023-03-30 ENCOUNTER — Other Ambulatory Visit (HOSPITAL_COMMUNITY)
Admission: RE | Admit: 2023-03-30 | Discharge: 2023-03-30 | Disposition: A | Discharge status: Home / Self Care | Payer: Medicare Other | Source: Ambulatory Visit | Attending: Internal Medicine | Admitting: Internal Medicine

## 2023-03-30 ENCOUNTER — Other Ambulatory Visit: Payer: Medicare Other

## 2023-03-30 DIAGNOSIS — E785 Hyperlipidemia, unspecified: Secondary | ICD-10-CM | POA: Insufficient documentation

## 2023-03-30 DIAGNOSIS — Z79899 Other long term (current) drug therapy: Secondary | ICD-10-CM | POA: Insufficient documentation

## 2023-03-30 LAB — HEPATIC FUNCTION PANEL
ALT: 25 U/L (ref 0–44)
AST: 24 U/L (ref 15–41)
Albumin: 4.2 g/dL (ref 3.5–5.0)
Alkaline Phosphatase: 71 U/L (ref 38–126)
Bilirubin, Direct: 0.2 mg/dL (ref 0.0–0.2)
Indirect Bilirubin: 0.9 mg/dL (ref 0.3–0.9)
Total Bilirubin: 1.1 mg/dL (ref 0.3–1.2)
Total Protein: 7.4 g/dL (ref 6.5–8.1)

## 2023-03-31 LAB — NMR, LIPOPROFILE
Cholesterol, Total: 194 mg/dL (ref 100–199)
HDL Cholesterol by NMR: 73 mg/dL (ref >39)
HDL Particle Number: 50.9 umol/L (ref >=30.5)
LDL Particle Number: 1079 nmol/L — ABNORMAL HIGH (ref <1000)
LDL Size: 21.1 nmol (ref >20.5)
LDL-C (NIH Calc): 100 mg/dL — ABNORMAL HIGH (ref 0–99)
LP-IR Score: 43 (ref <=45)
Small LDL Particle Number: 374 nmol/L (ref <=527)
Triglycerides by NMR: 121 mg/dL (ref 0–149)

## 2023-05-04 ENCOUNTER — Telehealth: Payer: Self-pay

## 2023-05-04 NOTE — Telephone Encounter (Signed)
Faxed off Physician's Order to [facility] [employer] [facility] [phone] on 05/04/2023

## 2023-05-19 ENCOUNTER — Other Ambulatory Visit: Payer: Self-pay | Admitting: Family Medicine

## 2023-06-22 ENCOUNTER — Encounter: Payer: Self-pay | Admitting: Gastroenterology

## 2023-06-23 ENCOUNTER — Other Ambulatory Visit: Payer: Self-pay

## 2023-06-23 MED ORDER — PANTOPRAZOLE SODIUM 40 MG PO TBEC: DELAYED_RELEASE_TABLET | ORAL | 5 refills | Status: DC

## 2023-06-23 NOTE — Progress Notes (Signed)
Patient requested refills via mychart

## 2023-10-28 ENCOUNTER — Other Ambulatory Visit: Payer: Self-pay

## 2023-10-28 ENCOUNTER — Ambulatory Visit
Admission: EM | Admit: 2023-10-28 | Discharge: 2023-10-28 | Disposition: A | Discharge status: Home / Self Care | Attending: Nurse Practitioner | Admitting: Nurse Practitioner

## 2023-10-28 ENCOUNTER — Encounter: Payer: Self-pay | Admitting: Emergency Medicine

## 2023-10-28 DIAGNOSIS — Z23 Encounter for immunization: Secondary | ICD-10-CM

## 2023-10-28 DIAGNOSIS — W540XXA Bitten by dog, initial encounter: Secondary | ICD-10-CM | POA: Diagnosis not present

## 2023-10-28 DIAGNOSIS — S61452A Open bite of left hand, initial encounter: Secondary | ICD-10-CM

## 2023-10-28 DIAGNOSIS — S61412A Laceration without foreign body of left hand, initial encounter: Secondary | ICD-10-CM

## 2023-10-28 MED ORDER — AMOXICILLIN-POT CLAVULANATE 875-125 MG PO TABS: 1.0000 | ORAL_TABLET | Freq: Two times a day (BID) | ORAL | 0 refills | Status: DC

## 2023-10-28 MED ORDER — TETANUS-DIPHTH-ACELL PERTUSSIS 5-2.5-18.5 LF-MCG/0.5 IM SUSY
0.5000 mL | PREFILLED_SYRINGE | Freq: Once | INTRAMUSCULAR | Status: AC
  Administered 2023-10-28: 0.5 mL via INTRAMUSCULAR

## 2023-10-28 MED ORDER — MUPIROCIN 2 % EX OINT: 1.0000 | TOPICAL_OINTMENT | Freq: Two times a day (BID) | CUTANEOUS | 0 refills | Status: DC

## 2023-10-28 NOTE — ED Triage Notes (Addendum)
 Pt reports went to grab a new bone from personal pet and reports dog bit left hand in the process of grabbing bone. Reports dog is up to date on vaccines.  Small linear "bite" sites noted to posterior hand and medial side of left hand. Bleeding controlled.

## 2023-10-28 NOTE — ED Provider Notes (Signed)
 [facility]    CSN: [id number] Arrival date & time: 10/28/23  1154      History   Chief Complaint Chief Complaint  Patient presents with   Animal Bite    HPI Debra Matthews is a 68 y.o. female.   The history is provided by the patient.   Patient presents for a dog bite to the left hand.  Patient states the injury occurred earlier today.  States that she gave her dog a new bone, states that the dog chewed on the bone, and thought he was done.  Patient states when she went to take the bone, the dog bit her left hand.  She has 2 lacerations to the left hand, 1 on the palm, and one on the backside of her hand.  Patient states that the dog's immunizations are up-to-date.  States that this is her dog.  Patient denies prior history of dog bite injuries with her dog.  She states she is unsure of when she had her last tetanus shot.  States that she did clean the area after the injury occurred.  Past Medical History:  Diagnosis Date   Abdominal pain, epigastric    Anemia    hx of iron def.   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)    GERD (gastroesophageal reflux disease)    Headache    hx of    History of chicken pox    History of shingles    RLS (restless legs syndrome)    Subchondral insufficiency fracture of femoral condyle, right, initial encounter (HCC) 07/15/2016    Patient Active Problem List   Diagnosis Date Noted   Systolic murmur 09/21/2022   Trochanteric bursitis of right hip 05/02/2021   Pain in joint of right hip 05/01/2021   Intolerance of continuous positive airway pressure (CPAP) ventilation 03/20/2021   Moderate obstructive sleep apnea 03/20/2021   Iron deficiency anemia due to sideropenic dysphagia 03/20/2021   Absolute anemia 03/20/2021   Chronic insomnia 05/29/2020   Obsessive-compulsive disorder  05/29/2020   Iron deficiency anemia 05/29/2020   Sleep related leg cramps 05/29/2020   PND (post-nasal drip) 05/19/2019   S/P right unicompartmental knee replacement 05/18/2017   Hyperlipidemia 05/06/2017   Physical exam 08/13/2016   Osteoporosis 08/13/2016   Carpal tunnel syndrome, bilateral 08/13/2016   Insomnia 08/13/2016   Subchondral insufficiency fracture of femoral condyle, right, initial encounter (HCC) 07/15/2016   Anxiety state 03/17/2016   RLS (restless legs syndrome) 02/11/2016   GERD (gastroesophageal reflux disease) 02/11/2016    Past Surgical History:  Procedure Laterality Date   KNEE ARTHROSCOPY WITH MENISCAL REPAIR Right 05/18/2017   Procedure: KNEE ARTHROSCOPY WITH PARTIAL LATERAL MENISCECTOMY;  Surgeon: Claiborne Crew, MD;  Location: [facility];  Service: Orthopedics;  Laterality: Right;  Adductor Block   MEDIAL PARTIAL KNEE REPLACEMENT Right 05/18/2017   Procedure: RIGHT MEDIAL PARTIAL KNEE REPLACEMENT;  Surgeon: Claiborne Crew, MD;  Location: [facility];  Service: Orthopedics;  Laterality: Right;  Adductor Block   TUBAL LIGATION      OB History   No obstetric history on file.      Home Medications    Prior to Admission medications   Medication Sig Start Date End Date Taking? Authorizing Provider  amoxicillin -clavulanate (AUGMENTIN ) 875-125 MG tablet Take 1  tablet by mouth every 12 (twelve) hours. 10/28/23  Yes Leath-Warren, Belen Bowers, NP  mupirocin  ointment (BACTROBAN ) 2 % Apply 1 Application topically 2 (two) times daily. 10/28/23  Yes Leath-Warren, Belen Bowers, NP  atorvastatin  (LIPITOR) 10 MG tablet Take 1 tablet (10 mg total) by mouth daily. (May start out taking every other day for a week or two) 01/01/23   Elmyra Haggard, MD  famotidine  (PEPCID ) 40 MG tablet Take 1 tablet (40 mg total) by mouth at bedtime. 02/09/23   Arlee Bellows, NP  meclizine  (ANTIVERT ) 25 MG tablet Take 1 tablet (25 mg total) by mouth 3 (three) times daily as needed for dizziness. 12/30/22    Tabori, Katherine E, MD  pantoprazole  (PROTONIX ) 40 MG tablet Take once to twice daily 06/23/23   Armbruster, Lendon Queen, MD  pramipexole  (MIRAPEX ) 1 MG tablet Take 1.5-3 tablets (1.5-3 mg total) by mouth daily. 1.5 tablet daily with additional 1.5 tab as needed for break-through 12/30/22   Tabori, Katherine E, MD  traZODone  (DESYREL ) 50 MG tablet Take 2 tablets (100 mg total) by mouth at bedtime. 12/30/22   Jess Morita, MD    Family History Family History  Problem Relation Age of Onset   Scoliosis Mother    Mitral valve prolapse Mother    Colon cancer Maternal Aunt    Esophageal cancer Neg Hx    Rectal cancer Neg Hx    Stomach cancer Neg Hx     Social History Social History   Tobacco Use   Smoking status: Never   Smokeless tobacco: Never  Vaping Use   Vaping status: Never Used  Substance Use Topics   Alcohol use: No    Alcohol/week: 0.0 standard drinks of alcohol   Drug use: No     Allergies   Pseudoephedrine and Prednisone    Review of Systems Review of Systems Per HPI  Physical Exam Triage Vital Signs ED Triage Vitals  Encounter Vitals Group     BP 10/28/23 1202 (!) 151/75     Systolic BP Percentile --      Diastolic BP Percentile --      Pulse Rate 10/28/23 1202 88     Resp 10/28/23 1202 20     Temp 10/28/23 1202 98 F (36.7 C)     Temp Source 10/28/23 1202 Oral     SpO2 10/28/23 1202 96 %     Weight --      Height --      Head Circumference --      Peak Flow --      Pain Score 10/28/23 1203 5     Pain Loc --      Pain Education --      Exclude from Growth Chart --    No data found.  Updated Vital Signs BP (!) 151/75 (BP Location: Right Arm)   Pulse 88   Temp 98 F (36.7 C) (Oral)   Resp 20   SpO2 96%   Visual Acuity Right Eye Distance:   Left Eye Distance:   Bilateral Distance:    Right Eye Near:   Left Eye Near:    Bilateral Near:     Physical Exam Vitals and nursing note reviewed.  Constitutional:      General: She is not in  acute distress.    Appearance: Normal appearance.  HENT:     Head: Normocephalic.  Eyes:     Extraocular Movements: Extraocular movements intact.     Pupils: Pupils are equal,  round, and reactive to light.  Pulmonary:     Effort: Pulmonary effort is normal.  Musculoskeletal:     Cervical back: Normal range of motion.  Skin:    General: Skin is warm and dry.     Findings: Laceration present.          Comments: 2 lacerations noted to the left hand.  Neurological:     General: No focal deficit present.     Mental Status: She is alert and oriented to person, place, and time.  Psychiatric:        Mood and Affect: Mood normal.        Behavior: Behavior normal.      UC Treatments / Results  Labs (all labs ordered are listed, but only abnormal results are displayed) Labs Reviewed - No data to display  EKG   Radiology No results found.  Procedures Procedures (including critical care time)  Medications Ordered in UC Medications  Tdap (BOOSTRIX) injection 0.5 mL (has no administration in time range)    Initial Impression / Assessment and Plan / UC Course  I have reviewed the triage vital signs and the nursing notes.  Pertinent labs & imaging results that were available during my care of the patient were reviewed by me and considered in my medical decision making (see chart for details).  Given the nature of the injury, will defer suturing to prevent infection.  Tdap was updated today.  The lacerations were cleansed with Hibiclens  soap and normal saline.  Antibiotic ointment, nonadherent gauze and Coban dressings were applied to the affected areas.  Due for rabies series as the dog was up-to-date on his vaccines.  Start Augmentin  875/125 mg tablets twice daily for the next 7 days.  Supportive care recommendations were provided and discussed with the patient to include over-the-counter analgesics such as Tylenol , cleansing the areas daily, and to monitor for signs of infection  to include foul-smelling drainage from the site, increased redness, swelling, or if she develops systemic symptoms such as fever or chills.  Patient was in agreement with this plan of care and verbalized understanding.  All questions were answered.  Patient stable for discharge.   Final Clinical Impressions(s) / UC Diagnoses   Final diagnoses:  Laceration of left hand without foreign body, initial encounter  Dog bite, hand, left, initial encounter     Discharge Instructions      Your Tdap was updated today.  It is good for the next 10 years. Keep the dressing in place for the next 24 hours. Apply antibiotic ointment to at each dressing change. Change the dressings daily.  Cleanse the area with warm water or with an antibacterial soap such as Dial gold bar soap and warm water, apply antibiotic ointment. May take over-the-counter Tylenol  as needed for pain, fever, or general discomfort. Apply ice to the affected area to help with pain or swelling. Monitor the area daily for signs of infection.  This includes increased redness, swelling, foul-smelling drainage from the lacerations, or if you develop symptoms such as fever or chills.  If any of the symptoms develop, seek care immediately. Follow-up as needed.   ED Prescriptions     Medication Sig Dispense Auth. Provider   amoxicillin -clavulanate (AUGMENTIN ) 875-125 MG tablet Take 1 tablet by mouth every 12 (twelve) hours. 14 tablet Leath-Warren, Belen Bowers, NP   mupirocin  ointment (BACTROBAN ) 2 % Apply 1 Application topically 2 (two) times daily. 30 g Leath-Warren, Belen Bowers, NP  PDMP not reviewed this encounter.   Hardy Lia, NP 10/28/23 1241

## 2023-10-28 NOTE — Discharge Instructions (Addendum)
 Your Tdap was updated today.  It is good for the next 10 years. Keep the dressing in place for the next 24 hours. Apply antibiotic ointment to at each dressing change. Change the dressings daily.  Cleanse the area with warm water or with an antibacterial soap such as Dial gold bar soap and warm water, apply antibiotic ointment. May take over-the-counter Tylenol  as needed for pain, fever, or general discomfort. Apply ice to the affected area to help with pain or swelling. Monitor the area daily for signs of infection.  This includes increased redness, swelling, foul-smelling drainage from the lacerations, or if you develop symptoms such as fever or chills.  If any of the symptoms develop, seek care immediately. Follow-up as needed.

## 2023-10-28 NOTE — ED Notes (Signed)
 CMA cleaned wound with hibiclense and sterile water.   Ointment applied to site, Nonadherent pad, secured with coban. Pt tolerated well.  Site management and infection prevention education reviewed. Pt verbalized understanding.

## 2023-11-27 ENCOUNTER — Ambulatory Visit: Payer: Self-pay

## 2023-11-27 NOTE — Telephone Encounter (Signed)
 Copied from CRM [id number]. Topic: Clinical - Red Word Triage >> Nov 27, 2023 10:51 AM Mesmerise C wrote: [facility] that prompted transfer to Nurse Triage: Extreme fatigue, dizziness, and headaches, very shaky, and wobbly patient stated it stops her from doing her daily activities, she stated she had a TBI a year ago and says could be the reason

## 2023-11-27 NOTE — Telephone Encounter (Signed)
 FYI Only or Action Required?: FYI only for provider  Patient was last seen in primary care on 01/14/2023 by Francenia Ingle, NP. Called Nurse Triage reporting Fatigue. Symptoms began several months ago. Interventions attempted: Nothing. Symptoms are: unchanged.  Triage Disposition: See PCP When Office is Open (Within 3 Days)  Patient/caregiver understands and will follow disposition?: Yes  Pt preferred to see PCP, scheduled appt 12/01/23 1000.   Reason for Disposition  [1] Fatigue (i.e., tires easily, decreased energy) AND [2] persists > 1 week  Answer Assessment - Initial Assessment Questions 1. DESCRIPTION: Describe how you are feeling.     Feels like sx have got worse over past 3 months  2. SEVERITY: How bad is it?  Can you stand and walk?   - MILD (0-3): Feels weak or tired, but does not interfere with work, school or normal activities.   - MODERATE (4-7): Able to stand and walk; weakness interferes with work, school, or normal activities.   - SEVERE (8-10): Unable to stand or walk; unable to do usual activities.     Mild to moderate 3. ONSET: When did these symptoms begin? (e.g., hours, days, weeks, months)     Had TBI over 1 year ago  6. OTHER SYMPTOMS: Do you have any other symptoms? (e.g., chest pain, fever, cough, SOB, vomiting, diarrhea, bleeding, other areas of pain)     Dizziness, HA, fatigue  Protocols used: Weakness (Generalized) and Fatigue-A-AH

## 2023-11-30 ENCOUNTER — Other Ambulatory Visit: Payer: Self-pay | Admitting: Family Medicine

## 2023-12-01 ENCOUNTER — Ambulatory Visit (INDEPENDENT_AMBULATORY_CARE_PROVIDER_SITE_OTHER): Admitting: Family Medicine

## 2023-12-01 ENCOUNTER — Encounter: Payer: Self-pay | Admitting: Family Medicine

## 2023-12-01 VITALS — BP 120/60 | HR 68 | Temp 98.2°F | Ht 61.0 in | Wt 137.2 lb

## 2023-12-01 DIAGNOSIS — R5383 Other fatigue: Secondary | ICD-10-CM | POA: Diagnosis not present

## 2023-12-01 DIAGNOSIS — D509 Iron deficiency anemia, unspecified: Secondary | ICD-10-CM

## 2023-12-01 DIAGNOSIS — M810 Age-related osteoporosis without current pathological fracture: Secondary | ICD-10-CM

## 2023-12-01 LAB — TSH: TSH: 3.81 u[IU]/mL (ref 0.35–5.50)

## 2023-12-01 LAB — HEPATIC FUNCTION PANEL
ALT: 14 U/L (ref 0–35)
AST: 19 U/L (ref 0–37)
Albumin: 4.4 g/dL (ref 3.5–5.2)
Alkaline Phosphatase: 58 U/L (ref 39–117)
Bilirubin, Direct: 0.2 mg/dL (ref 0.0–0.3)
Total Bilirubin: 1.2 mg/dL (ref 0.2–1.2)
Total Protein: 6.7 g/dL (ref 6.0–8.3)

## 2023-12-01 LAB — CBC WITH DIFFERENTIAL/PLATELET
Basophils Absolute: 0 10*3/uL (ref 0.0–0.1)
Basophils Relative: 0.9 % (ref 0.0–3.0)
Eosinophils Absolute: 0.1 10*3/uL (ref 0.0–0.7)
Eosinophils Relative: 2.6 % (ref 0.0–5.0)
HCT: 39.8 % (ref 36.0–46.0)
Hemoglobin: 13.2 g/dL (ref 12.0–15.0)
Lymphocytes Relative: 33.6 % (ref 12.0–46.0)
Lymphs Abs: 1.5 10*3/uL (ref 0.7–4.0)
MCHC: 33.3 g/dL (ref 30.0–36.0)
MCV: 91.8 fl (ref 78.0–100.0)
Monocytes Absolute: 0.3 10*3/uL (ref 0.1–1.0)
Monocytes Relative: 6.5 % (ref 3.0–12.0)
Neutro Abs: 2.5 10*3/uL (ref 1.4–7.7)
Neutrophils Relative %: 56.4 % (ref 43.0–77.0)
Platelets: 170 10*3/uL (ref 150.0–400.0)
RBC: 4.34 Mil/uL (ref 3.87–5.11)
RDW: 12.5 % (ref 11.5–15.5)
WBC: 4.4 10*3/uL (ref 4.0–10.5)

## 2023-12-01 LAB — BASIC METABOLIC PANEL WITH GFR
BUN: 14 mg/dL (ref 6–23)
CO2: 27 meq/L (ref 19–32)
Calcium: 9.2 mg/dL (ref 8.4–10.5)
Chloride: 106 meq/L (ref 96–112)
Creatinine, Ser: 0.64 mg/dL (ref 0.40–1.20)
GFR: 90.34 mL/min (ref >60.00)
Glucose, Bld: 88 mg/dL (ref 70–99)
Potassium: 4.5 meq/L (ref 3.5–5.1)
Sodium: 139 meq/L (ref 135–145)

## 2023-12-01 LAB — MAGNESIUM: Magnesium: 2 mg/dL (ref 1.5–2.5)

## 2023-12-01 LAB — IBC + FERRITIN
Ferritin: 42.7 ng/mL (ref 10.0–291.0)
Iron: 124 ug/dL (ref 42–145)
Saturation Ratios: 32.8 % (ref 20.0–50.0)
TIBC: 378 ug/dL (ref 250.0–450.0)
Transferrin: 270 mg/dL (ref 212.0–360.0)

## 2023-12-01 LAB — B12 AND FOLATE PANEL
Folate: 16.1 ng/mL (ref >5.9)
Vitamin B-12: 259 pg/mL (ref 211–911)

## 2023-12-01 LAB — VITAMIN D 25 HYDROXY (VIT D DEFICIENCY, FRACTURES): VITD: 36.38 ng/mL (ref 30.00–100.00)

## 2023-12-01 NOTE — Patient Instructions (Signed)
 Follow up as needed or as scheduled We'll notify you of your lab results and make any changes if needed ADD a daily women's multivitamin Make sure you are wearing your CPAP nightly Call with any questions or concerns Hang in there!!!

## 2023-12-01 NOTE — Progress Notes (Signed)
   Subjective:    Patient ID: Debra Matthews, female    DOB: [birth year], 68 y.o.   MRN: [id number]  HPI Fatigue- pt reports she has had worsening fatigue over the past year, but particularly noticeable over the last 3 months.  No fever, weight loss, night sweats.  No change in sleep patterns.  Pt reports waking up tired.  Pt reports mood is ok.  Pt has OSA- will wear CPAP most nights.  Pt reports increased muscle cramping in calves and thighs.  Not taking vitamins.  Does have dx of post-concussive syndrome.     Review of Systems For ROS see HPI     Objective:   Physical Exam Vitals reviewed.  Constitutional:      General: She is not in acute distress.    Appearance: Normal appearance. She is well-developed. She is not ill-appearing.  HENT:     Head: Normocephalic and atraumatic.   Eyes:     Conjunctiva/sclera: Conjunctivae normal.     Pupils: Pupils are equal, round, and reactive to light.   Neck:     Thyroid : No thyromegaly.   Cardiovascular:     Rate and Rhythm: Normal rate and regular rhythm.     Pulses: Normal pulses.     Heart sounds: Normal heart sounds. No murmur heard. Pulmonary:     Effort: Pulmonary effort is normal. No respiratory distress.     Breath sounds: Normal breath sounds.  Abdominal:     General: There is no distension.     Palpations: Abdomen is soft.     Tenderness: There is no abdominal tenderness.   Musculoskeletal:     Cervical back: Normal range of motion and neck supple.     Right lower leg: No edema.     Left lower leg: No edema.  Lymphadenopathy:     Cervical: No cervical adenopathy.   Skin:    General: Skin is warm and dry.   Neurological:     General: No focal deficit present.     Mental Status: She is alert and oriented to person, place, and time.   Psychiatric:        Mood and Affect: Mood normal.        Behavior: Behavior normal.        Thought Content: Thought content normal.           Assessment & Plan:  Fatigue-  deteriorated.  Pt has hx of similar but notes this is worsening over the last few months.  Suspect this is multifactorial- OSA, TBI, age, mood, RLS.  Will get labs to r/o metabolic causes of fatigue.  Encouraged her to start daily MVI.  Will address any abnormalities if present.  Pt expressed understanding and is in agreement w/ plan.

## 2023-12-02 ENCOUNTER — Ambulatory Visit: Payer: Self-pay | Admitting: Family Medicine

## 2023-12-02 NOTE — Telephone Encounter (Signed)
 Patient has questions about fatigue, notes she saw lab results and wanted some more information notes she is getting the vitamins to start

## 2024-01-18 ENCOUNTER — Other Ambulatory Visit (HOSPITAL_COMMUNITY): Payer: Self-pay | Admitting: Family Medicine

## 2024-01-18 DIAGNOSIS — Z1231 Encounter for screening mammogram for malignant neoplasm of breast: Secondary | ICD-10-CM

## 2024-02-08 ENCOUNTER — Ambulatory Visit (HOSPITAL_COMMUNITY)
Admission: RE | Admit: 2024-02-08 | Discharge: 2024-02-08 | Disposition: A | Discharge status: Home / Self Care | Source: Ambulatory Visit | Attending: Family Medicine | Admitting: Family Medicine

## 2024-02-08 ENCOUNTER — Encounter (HOSPITAL_COMMUNITY): Payer: Self-pay

## 2024-02-08 DIAGNOSIS — Z1231 Encounter for screening mammogram for malignant neoplasm of breast: Secondary | ICD-10-CM | POA: Insufficient documentation

## 2024-03-16 ENCOUNTER — Telehealth: Payer: Self-pay

## 2024-03-16 ENCOUNTER — Ambulatory Visit

## 2024-03-16 VITALS — BP 140/91 | Temp 97.7°F | Ht 61.0 in | Wt 137.0 lb

## 2024-03-16 DIAGNOSIS — M810 Age-related osteoporosis without current pathological fracture: Secondary | ICD-10-CM | POA: Diagnosis not present

## 2024-03-16 DIAGNOSIS — Z0000 Encounter for general adult medical examination without abnormal findings: Secondary | ICD-10-CM | POA: Diagnosis not present

## 2024-03-16 MED ORDER — PRAMIPEXOLE DIHYDROCHLORIDE 1 MG PO TABS: 1.5000 mg | ORAL_TABLET | Freq: Every day | ORAL | 3 refills | Status: AC

## 2024-03-16 NOTE — Telephone Encounter (Signed)
 I refilled the Pramipexole  as requested.  She is already on max dose Pantoprazole  (40mg  BID) so if this is not working, she needs to reach out to Dr Leigh so he is aware and they can discuss how to proceed.

## 2024-03-16 NOTE — Progress Notes (Signed)
 Subjective:   Debra Matthews is a 68 y.o. who presents for a Medicare Wellness preventive visit.  As a reminder, Annual Wellness Visits don't include a physical exam, and some assessments may be limited, especially if this visit is performed virtually. We may recommend an in-person follow-up visit with your provider if needed.  Visit Complete: Virtual I connected with  Debra Matthews on 03/16/24 by a video and audio enabled telemedicine application and verified that I am speaking with the correct person using two identifiers.  Patient Location: Home  Provider Location: Home Office  I discussed the limitations of evaluation and management by telemedicine. The patient expressed understanding and agreed to proceed.  Vital Signs: Because this visit was a virtual/telehealth visit, some criteria may be missing or patient reported. Any vitals not documented were not able to be obtained and vitals that have been documented are patient reported.   Persons Participating in Visit: Patient.  AWV Questionnaire: Yes: Patient Medicare AWV questionnaire was completed by the patient on 03/15/2024; I have confirmed that all information answered by patient is correct and no changes since this date.  Cardiac Risk Factors include: advanced age (>59men, >48 women);dyslipidemia;Other (see comment), Risk factor comments: OSA     Objective:    Today's Vitals   03/16/24 1528  BP: (!) 140/91  Temp: 97.7 F (36.5 C)  Weight: 137 lb (62.1 kg)  Height: 5' 1 (1.549 m)   Body mass index is 25.89 kg/m.     03/16/2024    3:46 PM 03/11/2023    9:37 AM 02/26/2022   10:36 AM 10/01/2020    2:19 PM 05/18/2017    7:55 PM 05/18/2017    7:19 AM 05/13/2017   11:19 AM  Advanced Directives  Does Patient Have a Medical Advance Directive? Yes Yes Yes Yes  Yes  Yes   Type of Estate agent of [place];Living will Healthcare Power of [employer] of [place];Living will Healthcare Power of  [place];Living will Healthcare Power of [place];Living will Healthcare Power of [place];Living will Healthcare Power of Attorney  Does patient want to make changes to medical advance directive?     No - Patient declined   No - Patient declined   Copy of Healthcare Power of Attorney in Chart? No - copy requested No - copy requested No - copy requested No - copy requested No - copy requested  No - copy requested  No - copy requested      Data saved with a previous flowsheet row definition    Current Medications (verified) Outpatient Encounter Medications as of 03/16/2024  Medication Sig   pantoprazole  (PROTONIX ) 40 MG tablet Take once to twice daily   pramipexole  (MIRAPEX ) 1 MG tablet Take 1.5-3 tablets (1.5-3 mg total) by mouth daily. 1.5 tablet daily with additional 1.5 tab as needed for break-through   traZODone  (DESYREL ) 50 MG tablet Take 2 tablets (100 mg total) by mouth at bedtime.   tretinoin (RETIN-A) 0.025 % cream Apply topically at bedtime.   atorvastatin  (LIPITOR) 10 MG tablet Take 1 tablet (10 mg total) by mouth daily. (May start out taking every other day for a week or two)   No facility-administered encounter medications on file as of 03/16/2024.    Allergies (verified) Pseudoephedrine and Prednisone    History: Past Medical History:  Diagnosis Date   Abdominal pain, epigastric    Anemia    hx of iron def.   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)    GERD (gastroesophageal reflux disease)    Headache    hx of    History of chicken pox    History of shingles    RLS (restless legs syndrome)    Subchondral insufficiency fracture of femoral condyle, right, initial encounter (HCC) 07/15/2016   Past Surgical History:  Procedure Laterality Date   KNEE ARTHROSCOPY WITH MENISCAL REPAIR Right 05/18/2017   Procedure: KNEE  ARTHROSCOPY WITH PARTIAL LATERAL MENISCECTOMY;  Surgeon: Ernie Cough, MD;  Location: [facility];  Service: Orthopedics;  Laterality: Right;  Adductor Block   MEDIAL PARTIAL KNEE REPLACEMENT Right 05/18/2017   Procedure: RIGHT MEDIAL PARTIAL KNEE REPLACEMENT;  Surgeon: Ernie Cough, MD;  Location: [facility];  Service: Orthopedics;  Laterality: Right;  Adductor Block   TUBAL LIGATION     Family History  Problem Relation Age of Onset   Scoliosis Mother    Mitral valve prolapse Mother    Colon cancer Maternal Aunt    Esophageal cancer Neg Hx    Rectal cancer Neg Hx    Stomach cancer Neg Hx    Social History   Socioeconomic History   Marital status: Married    Spouse name: Marcey   Number of children: 3   Years of education: Not on file   Highest education level: Some college, no degree  Occupational History   Occupation: retired  Tobacco Use   Smoking status: Never   Smokeless tobacco: Never  Vaping Use   Vaping status: Never Used  Substance and Sexual Activity   Alcohol use: No    Alcohol/week: 0.0 standard drinks of alcohol   Drug use: No   Sexual activity: Yes  Other Topics Concern   Not on file  Social History Narrative   Lives with husband/2025   Social Drivers of Health   Financial Resource Strain: Low Risk  (03/16/2024)   Overall Financial Resource Strain (CARDIA)    Difficulty of Paying Living Expenses: Not hard at all  Food Insecurity: No Food Insecurity (03/16/2024)   Hunger Vital Sign    Worried About Running Out of Food in the Last Year: Never true    Ran Out of Food in the Last Year: Never true  Transportation Needs: No Transportation Needs (03/16/2024)   PRAPARE - Administrator, Civil Service (Medical): No    Lack of Transportation (Non-Medical): No  Physical Activity: Inactive (03/16/2024)   Exercise Vital Sign    Days of Exercise per Week: 0 days    Minutes of Exercise per Session: 0 min  Stress: Stress Concern Present (03/16/2024)   [employer] of Occupational Health - Occupational Stress Questionnaire    Feeling of Stress: To some extent  Social Connections: Moderately Isolated (03/16/2024)   Social Connection and Isolation Panel    Frequency of Communication with Friends and Family: More than three times a week    Frequency of Social Gatherings with Friends and Family: Twice a week    Attends Religious Services: Never    Database administrator or Organizations: No    Attends Engineer, structural: Never    Marital Status: Married    Tobacco Counseling Counseling given: Not Answered    Clinical Intake:  Pre-visit preparation completed: Yes  Pain : No/denies pain  BMI - recorded: 25.89 Nutritional Status: BMI 25 -29 Overweight Nutritional Risks: None Diabetes: No  No results found for: HGBA1C   How often do you need to have someone help you when you read instructions, pamphlets, or other written materials from your doctor or pharmacy?: 1 - Never  Interpreter Needed?: No  Information entered by :: Kaleb Linquist, RMA   Activities of Daily Living     03/15/2024    7:41 PM  In your present state of health, do you have any difficulty performing the following activities:  Hearing? 0  Vision? 1  Difficulty concentrating or making decisions? 1  Walking or climbing stairs? 0  Dressing or bathing? 0  Doing errands, shopping? 0  Preparing Food and eating ? N  Using the Toilet? N  In the past six months, have you accidently leaked urine? Y  Do you have problems with loss of bowel control? N  Managing your Medications? N  Managing your Finances? N  Housekeeping or managing your Housekeeping? N    Patient Care Team: Mahlon Comer BRAVO, MD as PCP - General (Family Medicine) Okey Vina GAILS, MD as PCP - Cardiology (Cardiology) Lyndol Pama HERO, MD as Consulting Physician (Internal Medicine) Okey Leader, MD as Consulting Physician (Obstetrics and Gynecology) Pandora Cadet, [facility] as  Pharmacist (Pharmacist)  I have updated your Care Teams any recent Medical Services you may have received from other providers in the past year.     Assessment:   This is a routine wellness examination for Lee-Ann.  Hearing/Vision screen Hearing Screening - Comments:: Denies hearing difficulties   Vision Screening - Comments:: Wears eyeglasses/[facility]   Goals Addressed             This Visit's Progress    Patient Stated       Get back to 100 percent from fall in 07-2022 Still not all the way there/2025       Depression Screen     03/16/2024    3:50 PM 03/11/2023    9:43 AM 12/30/2022   10:41 AM 09/19/2022   12:46 PM 02/26/2022   10:34 AM 01/14/2022   10:10 AM 10/22/2020    9:31 AM  PHQ 2/9 Scores  PHQ - 2 Score 4 1 3  0 0 0 0  PHQ- 9 Score 11 7 10 4  0 0 0    Fall Risk     03/15/2024    7:41 PM 12/01/2023    9:51 AM 03/11/2023    9:34 AM 03/11/2023    9:33 AM 01/14/2023   12:45 PM  Fall Risk   Falls in the past year? 0 0 1 1 0  Number falls in past yr: 0 0  1 0  Injury with Fall? 0 0  1 0  Risk for fall due to :     No Fall Risks  Follow up Falls evaluation completed;Falls prevention discussed   Falls evaluation completed;Education provided;Falls prevention discussed Falls evaluation completed    MEDICARE RISK AT HOME:  Medicare Risk at Home Any stairs in or around the home?: (Patient-Rptd) No If so, are there any without handrails?: (Patient-Rptd) No Home free of loose throw rugs in walkways, pet beds, electrical cords, etc?: (Patient-Rptd) Yes Adequate lighting in your home to reduce risk of falls?: (Patient-Rptd) Yes Life alert?: (Patient-Rptd) No Use of a cane, walker or w/c?: (Patient-Rptd) No Grab bars in the bathroom?: (Patient-Rptd) No Shower chair or bench in shower?: (Patient-Rptd) Yes Elevated toilet seat or a  handicapped toilet?: (Patient-Rptd) No  TIMED UP AND GO:  Was the test performed?  No  Cognitive Function: Declined/Normal: No  cognitive concerns noted by patient or family. Patient alert, oriented, able to answer questions appropriately and recall recent events. No signs of memory loss or confusion.        03/11/2023    9:39 AM 02/26/2022   10:36 AM  6CIT Screen  What Year? 0 points 0 points  What month? 0 points 0 points  What time?  0 points  Count back from 20 0 points 0 points  Months in reverse 2 points 0 points  Repeat phrase 6 points 0 points  Total Score  0 points    Immunizations Immunization History  Administered Date(s) Administered   Fluad Quad(high Dose 65+) 05/18/2020   Influenza,inj,Quad PF,6+ Mos 06/11/2016, 05/19/2017, 05/19/2018, 05/19/2019   Influenza-Unspecified 03/16/2016, 05/16/2017   Moderna Sars-Covid-2 Vaccination 09/01/2019, 10/10/2019, 04/21/2020   PFIZER(Purple Top)SARS-COV-2 Vaccination 09/01/2019, 10/10/2019, 04/21/2020, 11/01/2020, 04/18/2021   Tdap 02/11/2016, 10/28/2023   Zoster Recombinant(Shingrix ) 06/12/2017, 09/17/2017    Screening Tests Health Maintenance  Topic Date Due   Pneumococcal Vaccine: 50+ Years (1 of 1 - PCV) Never done   Influenza Vaccine  01/15/2024   Medicare Annual Wellness (AWV)  03/10/2024   Mammogram  02/07/2025   Colonoscopy  03/17/2030   DTaP/Tdap/Td (3 - Td or Tdap) 10/27/2033   DEXA SCAN  Completed   Zoster Vaccines- Shingrix   Completed   HPV VACCINES  Aged Out   Meningococcal B Vaccine  Aged Out   COVID-19 Vaccine  Discontinued   Hepatitis C Screening  Discontinued    Health Maintenance Items Addressed: DEXA ordered, See Nurse Notes at the end of this note  Additional Screening:  Vision Screening: Recommended annual ophthalmology exams for early detection of glaucoma and other disorders of the eye. Is the patient up to date with their annual eye exam?  No  Who is the provider or what is the name of the office in which the patient attends annual eye exams? [facility]  Dental Screening: Recommended annual dental  exams for proper oral hygiene  Community Resource Referral / Chronic Care Management: CRR required this visit?  No   CCM required this visit?  No   Plan:    I have personally reviewed and noted the following in the patient's chart:   Medical and social history Use of alcohol, tobacco or illicit drugs  Current medications and supplements including opioid prescriptions. Patient is not currently taking opioid prescriptions. Functional ability and status Nutritional status Physical activity Advanced directives List of other physicians Hospitalizations, surgeries, and ER visits in previous 12 months Vitals Screenings to include cognitive, depression, and falls Referrals and appointments  In addition, I have reviewed and discussed with patient certain preventive protocols, quality metrics, and best practice recommendations. A written personalized care plan for preventive services as well as general preventive health recommendations were provided to patient.   Renee Erb L Robynne Roat, CMA   03/16/2024   After Visit Summary: (MyChart) Due to this being a telephonic visit, the after visit summary with patients personalized plan was offered to patient via MyChart   Notes: Patient is due for a DEXA and order has been placed.  She is due for a Flu vaccine, however, she stated that she would think about it.  Patient is requesting a refill for pramipexole  (MIRAPEX ) 1 MG tablet, to be sent to pharmacy on file.  Patient has concerns about current medication pantoprazole  (PROTONIX )  40 MG tablet, BID, as she explains that it is not working as good as it once was.  Patient is wanting to know if she should up the dose on medication.  She would like Dr. Mahlon to message her via MyChart to let her know what is best to do about it.  Patient had no other concerns to address today.

## 2024-03-16 NOTE — Patient Instructions (Addendum)
 Debra Matthews,  Thank you for taking the time for your Medicare Wellness Visit. I appreciate your continued commitment to your health goals. Please review the care plan we discussed, and feel free to reach out if I can assist you further.  Medicare recommends these wellness visits once per year to help you and your care team stay ahead of potential health issues. These visits are designed to focus on prevention, allowing your provider to concentrate on managing your acute and chronic conditions during your regular appointments.  Please note that Annual Wellness Visits do not include a physical exam. Some assessments may be limited, especially if the visit was conducted virtually. If needed, we may recommend a separate in-person follow-up with your provider.  Ongoing Care Seeing your primary care provider every 3 to 6 months helps us  monitor your health and provide consistent, personalized care. Last office visit on 12/01/2023.  You are due for a Flu vaccine, if you decide to get it.  Keep up the good work.  Referrals If a referral was made during today's visit and you haven't received any updates within two weeks, please contact the referred provider directly to check on the status. You have an order for:  [x]   Bone Density    Please call for appointment:  [facility] [address] [place], [place] [address] [phone]  Make sure to wear two-piece clothing.  No lotions, powders, or deodorants the day of the appointment. Make sure to bring picture ID and insurance card.  Bring list of medications you are currently taking including any supplements.    Recommended Screenings:  Health Maintenance  Topic Date Due   Pneumococcal Vaccine for age over 38 (1 of 1 - PCV) Never done   Flu Shot  01/15/2024   Medicare Annual Wellness Visit  03/10/2024   Breast Cancer Screening  02/07/2025   Colon Cancer Screening  03/17/2030   DTaP/Tdap/Td vaccine (3 - Td or  Tdap) 10/27/2033   DEXA scan (bone density measurement)  Completed   Zoster (Shingles) Vaccine  Completed   HPV Vaccine  Aged Out   Meningitis B Vaccine  Aged Out   COVID-19 Vaccine  Discontinued   Hepatitis C Screening  Discontinued       03/11/2023    9:37 AM  Advanced Directives  Does Patient Have a Medical Advance Directive? Yes  Type of Advance Directive Healthcare Power of Attorney  Copy of Healthcare Power of Attorney in Chart? No - copy requested   Advance Care Planning is important because it: Ensures you receive medical care that aligns with your values, goals, and preferences. Provides guidance to your family and loved ones, reducing the emotional burden of decision-making during critical moments.  Vision: Annual vision screenings are recommended for early detection of glaucoma, cataracts, and diabetic retinopathy. These exams can also reveal signs of chronic conditions such as diabetes and high blood pressure.  Dental: Annual dental screenings help detect early signs of oral cancer, gum disease, and other conditions linked to overall health, including heart disease and diabetes.  Please see the attached documents for additional preventive care recommendations.

## 2024-03-16 NOTE — Telephone Encounter (Signed)
 Patient is requesting a refill for pramipexole  (MIRAPEX ) 1 MG tablet, to be sent to pharmacy on file.  Patient has concerns about current medication pantoprazole  (PROTONIX ) 40 MG tablet, BID, as she explains that it is not working as good as it once was.  Patient is wanting to know if she should up the dose on medication.  She would like Dr. Mahlon to message her via MyChart to let her know what is best to do about it.

## 2024-03-17 ENCOUNTER — Telehealth: Payer: Self-pay

## 2024-03-17 ENCOUNTER — Encounter: Payer: Self-pay | Admitting: Family Medicine

## 2024-03-17 NOTE — Telephone Encounter (Signed)
 Called patient to discuss below message from Dr Mahlon no answer, LM to call back

## 2024-03-17 NOTE — Telephone Encounter (Signed)
 Copied from CRM [id number]. Topic: Clinical - Medical Advice >> Mar 17, 2024 11:15 AM [place] E wrote: Reason for CRM: Patient was returning a call from [place], relayed message from Dr. Mahlon to patient and she did not have any further questions at this time. However, patient is having some nasal drip and would like to see if PCP had any recommendations for a nasal spray. Patient is also having some intense thigh cramps, where the pain is getting worse (patient denied nurse triage for this issue), but also questioning if she should be taking more magnesium  or if there was a better route. Please call patient to discuss.

## 2024-03-17 NOTE — Telephone Encounter (Signed)
 Called patient to get a little more details. Left vm to return call. Sending to provider as RICK. Appt may be needed for patient to discuss post nasal drip and thigh cramps as these are new concerns for patient.

## 2024-03-17 NOTE — Telephone Encounter (Signed)
 OTC flonase  or nasonex would be appropriate for post-nasal drip.  Anytime someone complains of cramps, we encourage increased water intake, good potassium intake in the form of leafy greens, citrus fruits, bananas, etc, and a daily multivitamin.  If symptoms don't improve, will need appt to evaluate

## 2024-03-17 NOTE — Telephone Encounter (Unsigned)
 Copied from CRM #[id number]. Topic: General - Call Back - No Documentation >> Mar 17, 2024 11:55 AM Rea ORN wrote: Reason for CRM: Pt returning call to CMA to get more info regarding CRM that was placed this morning.   Pt asked to be called back on cell, [phone]

## 2024-03-17 NOTE — Telephone Encounter (Signed)
 Called patient back and advised starting Flonase  for the post nasal drip. For cramping, increase water intake, citrus fruits, multivitamin and leafy greens. Patient verbalized understanding. She will call back and make appt if needed

## 2024-03-18 NOTE — Telephone Encounter (Signed)
 Patient had some concerns about how her AWV phone visit went yesterday, sent a follow up message requesting details but was advised to send to [facility]

## 2024-03-18 NOTE — Telephone Encounter (Signed)
 Patient verbalized understanding

## 2024-03-21 NOTE — Telephone Encounter (Signed)
 Copied from CRM #[id number]. Topic: Complaint (DO NOT CONVERT) - Care >> Mar 21, 2024  2:37 PM Alfonso ORN wrote:  Date of Incident: in regards to incident CMA requested more information on Details of complaint: items on her appointment summary that were incorrect overall felt as if she wasn't listened to or taken seriously. How would the patient like to see it resolved? Callback [phone] On a scale of 1-10, how was your experience? 7

## 2024-03-24 ENCOUNTER — Encounter: Payer: Self-pay | Admitting: Student in an Organized Health Care Education/Training Program

## 2024-03-24 ENCOUNTER — Ambulatory Visit (INDEPENDENT_AMBULATORY_CARE_PROVIDER_SITE_OTHER): Admitting: Student in an Organized Health Care Education/Training Program

## 2024-03-24 VITALS — BP 140/65 | HR 78 | Temp 98.1°F | Wt 137.0 lb

## 2024-03-24 DIAGNOSIS — N3001 Acute cystitis with hematuria: Secondary | ICD-10-CM | POA: Diagnosis not present

## 2024-03-24 DIAGNOSIS — N3000 Acute cystitis without hematuria: Secondary | ICD-10-CM | POA: Insufficient documentation

## 2024-03-24 LAB — POCT URINALYSIS DIPSTICK
Bilirubin, UA: NEGATIVE
Glucose, UA: NEGATIVE
Ketones, UA: NEGATIVE
Nitrite, UA: NEGATIVE
Protein, UA: NEGATIVE
Spec Grav, UA: >=1.03 — AB (ref 1.010–1.025)
Urobilinogen, UA: 0.2 U/dL
pH, UA: 6 (ref 5.0–8.0)

## 2024-03-24 MED ORDER — PHENAZOPYRIDINE HCL 200 MG PO TABS: 200.0000 mg | ORAL_TABLET | Freq: Three times a day (TID) | ORAL | 0 refills | Status: DC | PRN

## 2024-03-24 MED ORDER — NITROFURANTOIN MONOHYD MACRO 100 MG PO CAPS: 100.0000 mg | ORAL_CAPSULE | Freq: Two times a day (BID) | ORAL | 0 refills | Status: AC

## 2024-03-24 NOTE — Patient Instructions (Addendum)
  VISIT SUMMARY: Today, you were seen for symptoms of a bladder infection, including a strong urge to urinate, burning sensation during urination, and occasional involuntary urination. You also reported feeling generally unwell with mild nausea but no vomiting.  YOUR PLAN: -ACUTE CYSTITIS WITH HEMATURIA: Acute cystitis is a bladder infection that can cause pain and a burning sensation during urination, as well as an urgent need to urinate. Hematuria means there is blood in the urine. You have been prescribed Macrobid 100 mg to take twice daily for 7 days to treat the infection. A urine culture has been sent to identify the bacteria and check for antibiotic resistance. You have also been prescribed Pyridium to help relieve the burning sensation, but be aware that it may turn your urine orange. Start taking the antibiotics immediately, and you should see improvement in 1-3 days. If your symptoms worsen, please contact the clinic.  INSTRUCTIONS: Please start taking the prescribed antibiotics (Macrobid) immediately and follow the dosage instructions carefully. You should see improvement in your symptoms within 1-3 days. If your symptoms worsen or do not improve, contact the clinic. Additionally, take Pyridium as prescribed to help relieve the burning sensation during urination, but be aware that it may cause your urine to turn orange. A urine culture has been sent to identify the bacteria causing the infection and to check for antibiotic resistance.

## 2024-03-24 NOTE — Progress Notes (Signed)
 Acute Office Visit  Subjective:     Patient ID: Debra Matthews, female    DOB: 1955/06/27, 68 y.o.   MRN: [id number]  Chief Complaint  Patient presents with   Urinary Retention    Woke up this am with burning,stinging and unable to urinate.     HPI  Discussed the use of AI scribe software for clinical note transcription with the patient, who gave verbal consent to proceed.  History of Present Illness Debra Matthews is a 68 year old female who presents with dysuria and urinary urgency.  She woke up this morning with a strong urge to urinate but was unable to do so, accompanied by a severe and persistent burning sensation during urination. She experiences urinary urgency with occasional involuntary urination, but no significant relief from the burning sensation.  She denies any history of bladder infections and states that this is unusual for her. She feels generally unwell today, with mild nausea but no vomiting. She has been able to eat and drink, though her energy levels are low. No fever, vaginal discharge, or recent changes in her health. She has not noticed any blood in her urine, although she did observe a slight pink tinge on toilet tissue once today.  Her current medications remain unchanged. She has no known allergies to antibiotics but reports adverse reactions to prednisone  and Sudafed, which make her feel unwell.  She has no history of tobacco or alcohol use.      Objective:    BP (!) 140/65   Pulse 78   Temp 98.1 F (36.7 C) (Oral)   Wt 137 lb (62.1 kg)   SpO2 99%   BMI 25.89 kg/m   Physical Exam  Gen: Tired appearing woman Heart: Regular rate, no murmur Lungs: Normal work of breathing, clear throughout Abd: Soft, mild tenderness in the suprapubic area, no costovertebral tenderness Ext: Warm, no edema  Results for orders placed or performed in visit on 03/24/24  POCT urinalysis dipstick  Result Value Ref Range   Color, UA     Clarity, UA     Glucose, UA  Negative Negative   Bilirubin, UA negative    Ketones, UA negative    Spec Grav, UA >=1.030 (A) 1.010 - 1.025   Blood, UA 3+    pH, UA 6.0 5.0 - 8.0   Protein, UA Negative Negative   Urobilinogen, UA 0.2 0.2 or 1.0 E.U./dL   Nitrite, UA negative    Leukocytes, UA Moderate (2+) (A) Negative   Appearance     Odor         Assessment & Plan:   Problem List Items Addressed This Visit       Unprioritized   Acute cystitis - Primary   History and exam are consistent with acute cystitis.  Point of care urinalysis shows microscopic hematuria as well as moderate leukocytes.  No signs or symptoms to suggest acute pyelonephritis.  She is taking oral hydration well, no nausea, so I think she will do okay with oral antibiotics.  No history of recent antibiotics, no past allergies to antibiotics.  The dysuria symptoms are most bothersome to her.  Will treat with antibiotics.  Decided to treat with nitrofurantoin 100 mg twice daily for 7 days.  She has normal renal function.  Will also give Pyridium 200 mg to use up to 3 times daily for no more than 2 consecutive days for supportive care of the dysuria.  Will send urine culture to rule  out resistance.      Relevant Medications   nitrofurantoin, macrocrystal-monohydrate, (MACROBID) 100 MG capsule   phenazopyridine (PYRIDIUM) 200 MG tablet   Other Relevant Orders   POCT urinalysis dipstick (Completed)   Urine Culture    Meds ordered this encounter  Medications   nitrofurantoin, macrocrystal-monohydrate, (MACROBID) 100 MG capsule    Sig: Take 1 capsule (100 mg total) by mouth 2 (two) times daily for 7 days.    Dispense:  14 capsule    Refill:  0   phenazopyridine (PYRIDIUM) 200 MG tablet    Sig: Take 1 tablet (200 mg total) by mouth 3 (three) times daily as needed for pain.    Dispense:  6 tablet    Refill:  0    Return if symptoms worsen or fail to improve.  Debra Debby Specking, MD

## 2024-03-24 NOTE — Assessment & Plan Note (Signed)
 History and exam are consistent with acute cystitis.  Point of care urinalysis shows microscopic hematuria as well as moderate leukocytes.  No signs or symptoms to suggest acute pyelonephritis.  She is taking oral hydration well, no nausea, so I think she will do okay with oral antibiotics.  No history of recent antibiotics, no past allergies to antibiotics.  The dysuria symptoms are most bothersome to her.  Will treat with antibiotics.  Decided to treat with nitrofurantoin 100 mg twice daily for 7 days.  She has normal renal function.  Will also give Pyridium 200 mg to use up to 3 times daily for no more than 2 consecutive days for supportive care of the dysuria.  Will send urine culture to rule out resistance.

## 2024-03-27 LAB — URINE CULTURE
MICRO NUMBER:: 17079189
SPECIMEN QUALITY:: ADEQUATE

## 2024-03-28 ENCOUNTER — Ambulatory Visit: Payer: Self-pay | Admitting: Student in an Organized Health Care Education/Training Program

## 2024-03-28 NOTE — Telephone Encounter (Signed)
 Patient is responding back stating she is sorry she missed your phone call and she is feeling better but still very tired

## 2024-03-28 NOTE — Telephone Encounter (Signed)
 Patient called, stated she tried to reach back out to [facility] but the line was possibly disconnected. She asked if Amy could return her call as she has a few questions she would like to ask

## 2024-04-05 ENCOUNTER — Ambulatory Visit (HOSPITAL_COMMUNITY)
Admission: RE | Admit: 2024-04-05 | Discharge: 2024-04-05 | Disposition: A | Discharge status: Home / Self Care | Source: Ambulatory Visit | Attending: Family Medicine | Admitting: Family Medicine

## 2024-04-05 DIAGNOSIS — M810 Age-related osteoporosis without current pathological fracture: Secondary | ICD-10-CM | POA: Insufficient documentation

## 2024-04-07 ENCOUNTER — Ambulatory Visit: Payer: Self-pay | Admitting: Family Medicine

## 2024-04-07 MED ORDER — ALENDRONATE SODIUM 70 MG PO TABS: 70.0000 mg | ORAL_TABLET | ORAL | 3 refills | Status: DC

## 2024-04-07 NOTE — Telephone Encounter (Signed)
 Patient is questioning the strength of calcium  nad vitamin d  she should be taking? Also asking if a women's multivitamin is not enough with Vitamin D  and calcium ?

## 2024-06-17 ENCOUNTER — Other Ambulatory Visit: Payer: Self-pay | Admitting: Gastroenterology

## 2024-06-18 ENCOUNTER — Encounter: Payer: Self-pay | Admitting: Family Medicine

## 2024-06-20 MED ORDER — TRAZODONE HCL 50 MG PO TABS: 100.0000 mg | ORAL_TABLET | Freq: Every day | ORAL | 1 refills | Status: AC

## 2024-06-20 NOTE — Telephone Encounter (Signed)
 Requested Prescriptions   Pending Prescriptions Disp Refills   traZODone  (DESYREL ) 50 MG tablet 180 tablet 1    Sig: Take 2 tablets (100 mg total) by mouth at bedtime.     Date of patient request: 06/20/24 Last office visit: 12/01/2023 Upcoming visit: Visit date not found Date of last refill: 12/30/22 Last refill amount: 180 w/ 1 refill

## 2024-07-12 ENCOUNTER — Ambulatory Visit: Payer: Self-pay

## 2024-07-12 ENCOUNTER — Ambulatory Visit (INDEPENDENT_AMBULATORY_CARE_PROVIDER_SITE_OTHER): Admitting: Family Medicine

## 2024-07-12 VITALS — BP 120/68 | HR 78 | Temp 98.0°F | Resp 18 | Ht 61.0 in | Wt 137.4 lb

## 2024-07-12 DIAGNOSIS — R0989 Other specified symptoms and signs involving the circulatory and respiratory systems: Secondary | ICD-10-CM

## 2024-07-12 DIAGNOSIS — H66003 Acute suppurative otitis media without spontaneous rupture of ear drum, bilateral: Secondary | ICD-10-CM

## 2024-07-12 MED ORDER — ALBUTEROL SULFATE HFA 108 (90 BASE) MCG/ACT IN AERS: 2.0000 | INHALATION_SPRAY | Freq: Four times a day (QID) | RESPIRATORY_TRACT | 0 refills | Status: AC | PRN

## 2024-07-12 MED ORDER — AMOXICILLIN-POT CLAVULANATE 875-125 MG PO TABS: 1.0000 | ORAL_TABLET | Freq: Two times a day (BID) | ORAL | 0 refills | Status: AC

## 2024-07-12 MED ORDER — PROMETHAZINE-DM 6.25-15 MG/5ML PO SYRP: 5.0000 mL | ORAL_SOLUTION | Freq: Four times a day (QID) | ORAL | 0 refills | Status: AC | PRN

## 2024-07-12 NOTE — Telephone Encounter (Signed)
 URI with wheezing and earache.    FYI Only or Action Required?: FYI only for provider: appointment scheduled on 1/27.  Patient was last seen in primary care on 03/24/2024 by Jerrell Cleatus Ned, MD.  Called Nurse Triage reporting Cough.  Symptoms began a week ago.  Interventions attempted: Nothing.  Symptoms are: gradually worsening.  Triage Disposition: See HCP Within 4 Hours (Or PCP Triage)  Patient/caregiver understands and will follow disposition?: Yes   Reason for Triage: Patient has been sick since last week week Friday night got worse, low grade fever chest congestion, coughing, wheezing, ear ache   Reason for Disposition  Wheezing is present  Answer Assessment - Initial Assessment Questions 1. ONSET: When did the cough begin?      Last week  2. SEVERITY: How bad is the cough today?      Severe, impacting sleep   5. DIFFICULTY BREATHING: Are you having difficulty breathing? If Yes, ask: How bad is it? (e.g., mild, moderate, severe)      Denies  6. FEVER: Do you have a fever? If Yes, ask: What is your temperature, how was it measured, and when did it start?     Low grade, 100.44F  8. LUNG HISTORY: Do you have any history of lung disease?  (e.g., pulmonary embolus, asthma, emphysema)     OSA   10. OTHER SYMPTOMS: Do you have any other symptoms? (e.g., runny nose, wheezing, chest pain) Earache, HA  Protocols used: Cough - Acute Productive-A-AH

## 2024-07-12 NOTE — Patient Instructions (Signed)
 Follow up as needed or as scheduled START the Augmentin  twice daily- take w/ food to minimize GI upset Drink LOTS of fluids REST! USE the Albuterol - 2 puffs as needed- for cough, wheezing, or shortness of breath USE the cough syrup as needed Call with any questions or concerns Hang in there!

## 2024-07-12 NOTE — Progress Notes (Signed)
" ° °  Subjective:    Patient ID: Debra Matthews, female    DOB: [birth year], 69 y.o.   MRN: [id number]  HPI URI- pt reports mild cough all last week but worsened Friday.  Cough is rarely productive but nearly continuous.  Having urinary incontinence.  Some SOB w/ exertion.  + HA but denies sinus pain/pressure.  + nasal congestion.  + low grade temp.  + ear pain.   Review of Systems For ROS see HPI     Objective:   Physical Exam Vitals reviewed.  Constitutional:      General: She is not in acute distress.    Appearance: Normal appearance. She is well-developed. She is not ill-appearing.  HENT:     Head: Normocephalic and atraumatic.     Right Ear: Ear canal normal. A middle ear effusion is present. Tympanic membrane is erythematous.     Left Ear: Ear canal normal. A middle ear effusion is present. Tympanic membrane is erythematous.     Nose: Congestion present. No rhinorrhea.  Eyes:     Conjunctiva/sclera: Conjunctivae normal.     Pupils: Pupils are equal, round, and reactive to light.  Cardiovascular:     Rate and Rhythm: Normal rate and regular rhythm.     Heart sounds: Normal heart sounds. No murmur heard. Pulmonary:     Effort: Pulmonary effort is normal. No respiratory distress.     Breath sounds: Rhonchi (at bases bilaterally) present. No wheezing.     Comments: Near continuous cough, improved s/p neb tx Musculoskeletal:     Cervical back: Normal range of motion and neck supple.  Lymphadenopathy:     Cervical: No cervical adenopathy.  Skin:    General: Skin is warm and dry.  Neurological:     General: No focal deficit present.     Mental Status: She is alert and oriented to person, place, and time.  Psychiatric:        Mood and Affect: Mood normal.        Behavior: Behavior normal.        Thought Content: Thought content normal.           Assessment & Plan:  Rhonchi- new.  At lung bases bilaterally.  Cough and air movement improved s/p neb tx but concerning for CAP.   Start Augmentin .  Albuterol  and cough meds prn.  Reviewed supportive care and red flags that should prompt return.  Pt expressed understanding and is in agreement w/ plan.   Bilateral OM- new.  Both ears w/ evidence of infxn.  Will start Augmentin  due to presence of rhonchi bilaterally and likely CAP.  Pt expressed understanding and is in agreement w/ plan.  "

## 2024-07-12 NOTE — Telephone Encounter (Signed)
 Patient has appt today.

## 2024-07-15 ENCOUNTER — Other Ambulatory Visit: Payer: Self-pay | Admitting: Gastroenterology
